# Patient Record
Sex: Male | Born: 1970 | Race: Black or African American | Hispanic: No | Smoking: Former smoker
Health system: Southern US, Community
[De-identification: ages and names within clinical notes are randomized; demographics above are authoritative.]

## PROBLEM LIST (undated history)

## (undated) DIAGNOSIS — N183 Chronic kidney disease, stage 3 unspecified: Secondary | ICD-10-CM

## (undated) DIAGNOSIS — D649 Anemia, unspecified: Secondary | ICD-10-CM

## (undated) DIAGNOSIS — K219 Gastro-esophageal reflux disease without esophagitis: Secondary | ICD-10-CM

## (undated) DIAGNOSIS — J302 Other seasonal allergic rhinitis: Secondary | ICD-10-CM

## (undated) DIAGNOSIS — I83009 Varicose veins of unspecified lower extremity with ulcer of unspecified site: Secondary | ICD-10-CM

## (undated) DIAGNOSIS — G629 Polyneuropathy, unspecified: Secondary | ICD-10-CM

## (undated) DIAGNOSIS — A419 Sepsis, unspecified organism: Secondary | ICD-10-CM

## (undated) DIAGNOSIS — F329 Major depressive disorder, single episode, unspecified: Secondary | ICD-10-CM

## (undated) DIAGNOSIS — E118 Type 2 diabetes mellitus with unspecified complications: Secondary | ICD-10-CM

## (undated) DIAGNOSIS — L97909 Non-pressure chronic ulcer of unspecified part of unspecified lower leg with unspecified severity: Secondary | ICD-10-CM

## (undated) DIAGNOSIS — I10 Essential (primary) hypertension: Secondary | ICD-10-CM

## (undated) DIAGNOSIS — M109 Gout, unspecified: Secondary | ICD-10-CM

## (undated) DIAGNOSIS — H269 Unspecified cataract: Secondary | ICD-10-CM

## (undated) DIAGNOSIS — IMO0002 Reserved for concepts with insufficient information to code with codable children: Secondary | ICD-10-CM

## (undated) DIAGNOSIS — R0602 Shortness of breath: Secondary | ICD-10-CM

## (undated) DIAGNOSIS — F32A Depression, unspecified: Secondary | ICD-10-CM

## (undated) DIAGNOSIS — I82409 Acute embolism and thrombosis of unspecified deep veins of unspecified lower extremity: Secondary | ICD-10-CM

## (undated) DIAGNOSIS — I38 Endocarditis, valve unspecified: Secondary | ICD-10-CM

## (undated) HISTORY — DX: Acute embolism and thrombosis of unspecified deep veins of unspecified lower extremity: I82.409

## (undated) HISTORY — DX: Type 2 diabetes mellitus with unspecified complications: E11.8

## (undated) HISTORY — DX: Sepsis, unspecified organism: A41.9

## (undated) HISTORY — PX: OTHER SURGICAL HISTORY: SHX169

## (undated) HISTORY — PX: CYST EXCISION: SHX5701

## (undated) HISTORY — DX: Unspecified cataract: H26.9

## (undated) HISTORY — DX: Non-pressure chronic ulcer of unspecified part of unspecified lower leg with unspecified severity: L97.909

## (undated) HISTORY — DX: Chronic kidney disease, stage 3 unspecified: N18.30

## (undated) HISTORY — DX: Morbid (severe) obesity due to excess calories: E66.01

## (undated) HISTORY — DX: Major depressive disorder, single episode, unspecified: F32.9

## (undated) HISTORY — DX: Depression, unspecified: F32.A

## (undated) HISTORY — DX: Essential (primary) hypertension: I10

## (undated) HISTORY — DX: Reserved for concepts with insufficient information to code with codable children: IMO0002

## (undated) HISTORY — DX: Varicose veins of unspecified lower extremity with ulcer of unspecified site: I83.009

## (undated) HISTORY — DX: Polyneuropathy, unspecified: G62.9

## (undated) HISTORY — DX: Gout, unspecified: M10.9

## (undated) HISTORY — DX: Chronic kidney disease, stage 3 (moderate): N18.3

## (undated) HISTORY — DX: Other seasonal allergic rhinitis: J30.2

## (undated) HISTORY — PX: BELOW KNEE LEG AMPUTATION: SUR23

## (undated) HISTORY — DX: Gastro-esophageal reflux disease without esophagitis: K21.9

## (undated) HISTORY — DX: Anemia, unspecified: D64.9

---

## 2013-12-24 ENCOUNTER — Other Ambulatory Visit: Payer: Self-pay

## 2013-12-24 DIAGNOSIS — N186 End stage renal disease: Secondary | ICD-10-CM

## 2013-12-24 DIAGNOSIS — Z01810 Encounter for preprocedural cardiovascular examination: Secondary | ICD-10-CM

## 2014-01-08 ENCOUNTER — Encounter: Payer: Self-pay | Admitting: Surgery

## 2014-01-11 ENCOUNTER — Ambulatory Visit (INDEPENDENT_AMBULATORY_CARE_PROVIDER_SITE_OTHER)
Admission: RE | Admit: 2014-01-11 | Discharge: 2014-01-11 | Disposition: A | Discharge status: Home / Self Care | Payer: No Typology Code available for payment source | Source: Ambulatory Visit | Attending: Surgery | Admitting: Surgery

## 2014-01-11 ENCOUNTER — Encounter: Payer: Self-pay | Admitting: Surgery

## 2014-01-11 ENCOUNTER — Ambulatory Visit (HOSPITAL_COMMUNITY)
Admission: RE | Admit: 2014-01-11 | Discharge: 2014-01-11 | Disposition: A | Discharge status: Home / Self Care | Payer: No Typology Code available for payment source | Source: Ambulatory Visit | Attending: Surgery | Admitting: Surgery

## 2014-01-11 ENCOUNTER — Ambulatory Visit (INDEPENDENT_AMBULATORY_CARE_PROVIDER_SITE_OTHER): Payer: Medicare Other | Admitting: Surgery

## 2014-01-11 VITALS — BP 143/68 | HR 80 | Ht >= 80 in | Wt >= 6400 oz

## 2014-01-11 DIAGNOSIS — Z01810 Encounter for preprocedural cardiovascular examination: Secondary | ICD-10-CM

## 2014-01-11 DIAGNOSIS — N186 End stage renal disease: Secondary | ICD-10-CM

## 2014-01-11 DIAGNOSIS — Z01818 Encounter for other preprocedural examination: Secondary | ICD-10-CM | POA: Insufficient documentation

## 2014-01-11 NOTE — Progress Notes (Signed)
Patient name: Angel Mays MRN: [id number] DOB: [birth year] Sex: male   Referred by: Dr. Detterding  Reason for referral:  Chief Complaint  Patient presents with  . New Evaluation    eval for access placement     HISTORY OF PRESENT ILLNESS: The patient comes in today for evaluation of permanent dialysis access.  He is right-handed.  He is not yet on dialysis.  His renal failure is secondary to diabetes.  His diabetes is relatively well controlled.  He does suffer from morbid obesity.  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  He is bedridden secondary to his obesity.  The patient is on Xaralto for history of blood clots.  He is in a rehabilitation center currently as he was recently suffering from pneumonia.  He has not yet recovered from his bout of pneumonia.  Past Medical History  Diagnosis Date  . Anemia   . Diabetes mellitus without complication   . GERD (gastroesophageal reflux disease)   . Hypertension   . Venous stasis ulcers   . Morbid obesity     Past Surgical History  Procedure Laterality Date  . Below knee leg amputation Left     History   Social History  . Marital Status: Single    Spouse Name: N/A    Number of Children: N/A  . Years of Education: N/A   Occupational History  . Not on file.   Social History Main Topics  . Smoking status: Never Smoker   . Smokeless tobacco: Never Used  . Alcohol Use: No  . Drug Use: No  . Sexual Activity: Not on file   Other Topics Concern  . Not on file   Social History Narrative  . No narrative on file    Family History  Problem Relation Age of Onset  . Diabetes Mother   . Hyperlipidemia Mother   . Hypertension Mother     Allergies as of 01/11/2014 - Review Complete 01/11/2014  Allergen Reaction Noted  . Omnipaque [iohexol]  01/11/2014    No current outpatient prescriptions on file prior to visit.   No current facility-administered medications on file  prior to visit.     REVIEW OF SYSTEMS: Cardiovascular: Shortness of breath with exertion, bilateral leg swelling Pulmonary: No productive cough, asthma or wheezing. Neurologic: No weakness, paresthesias, aphasia, or amaurosis. No dizziness. Hematologic: No bleeding problems or clotting disorders. Musculoskeletal: Leg weakness Gastrointestinal: No blood in stool or hematemesis Genitourinary: No dysuria or hematuria. Psychiatric:: No history of major depression. Integumentary: Lower extremity ulcers. Constitutional: No fever or chills.  PHYSICAL EXAMINATION: General: The patient appears their stated age.  Vital signs are BP 143/68  Pulse 80  Ht 6\' 8"  (2.032 m)  Wt 490 lb (222.263 kg)  BMI 53.83 kg/m2  SpO2 100% HEENT:  No gross abnormalities Pulmonary: Respirations are non-labored Abdomen: Soft and non-tender  Musculoskeletal: Left below knee amputation   Neurologic: No focal weakness or paresthesias are detected, Skin: There are no ulcer or rashes noted.  Venous stasis changes to the right leg Psychiatric: The patient has normal affect. Cardiovascular: There is a regular rate and rhythm without significant murmur appreciated.  Palpable left radial and brachial pulse  Diagnostic Studies: Vein mapping was ordered and reviewed.  He has a marginal vein on both the left and the right.    Assessment:  Chronic renal insufficiency Plan: After reviewing the patient's vein mapping, I have elected to proceed with a  left brachiocephalic fistula.  His vein somewhat marginal in the upper arm, however I feel this is reasonable to start to see if we can get this to dilate.  I discussed the risks and benefits of the operation with the patient which include but are not limited to the risk of steal, the risk of non-maturity, and the need for future interventions.  Potentially, the patient could have a basilic vein transposition.  I will initially attempt a brachiocephalic fistula.  The patient is  on Xaralto.  This will need to be discontinued prior to his operation.  I will his medical doctors make a decision regarding the need for Lovenox bridge.  The patient wants to continue to get his strength back from his recent pneumonia.  He would like to delay the operation for approximately one month.  He will contact me when he feels he is a strong enough to undergo an operation     V. Charlena Cross, M.D. [facility] Office: [phone] Pager:  [phone]

## 2014-01-19 ENCOUNTER — Other Ambulatory Visit: Payer: Self-pay | Admitting: *Deleted

## 2014-01-19 ENCOUNTER — Encounter: Payer: Self-pay | Admitting: *Deleted

## 2014-01-21 ENCOUNTER — Inpatient Hospital Stay (HOSPITAL_COMMUNITY)
Admission: EM | Admit: 2014-01-21 | Discharge: 2014-01-28 | DRG: 682 | Payer: Medicare Other | Attending: Internal Medicine | Admitting: Internal Medicine

## 2014-01-21 ENCOUNTER — Encounter (HOSPITAL_COMMUNITY): Payer: Self-pay | Admitting: Emergency Medicine

## 2014-01-21 ENCOUNTER — Emergency Department (HOSPITAL_COMMUNITY): Payer: Medicare Other

## 2014-01-21 DIAGNOSIS — K219 Gastro-esophageal reflux disease without esophagitis: Secondary | ICD-10-CM | POA: Diagnosis present

## 2014-01-21 DIAGNOSIS — L97909 Non-pressure chronic ulcer of unspecified part of unspecified lower leg with unspecified severity: Secondary | ICD-10-CM | POA: Diagnosis present

## 2014-01-21 DIAGNOSIS — L97809 Non-pressure chronic ulcer of other part of unspecified lower leg with unspecified severity: Secondary | ICD-10-CM | POA: Diagnosis present

## 2014-01-21 DIAGNOSIS — I872 Venous insufficiency (chronic) (peripheral): Secondary | ICD-10-CM | POA: Diagnosis present

## 2014-01-21 DIAGNOSIS — R5381 Other malaise: Secondary | ICD-10-CM | POA: Diagnosis present

## 2014-01-21 DIAGNOSIS — G9340 Encephalopathy, unspecified: Secondary | ICD-10-CM | POA: Diagnosis present

## 2014-01-21 DIAGNOSIS — Z7401 Bed confinement status: Secondary | ICD-10-CM

## 2014-01-21 DIAGNOSIS — E1169 Type 2 diabetes mellitus with other specified complication: Secondary | ICD-10-CM | POA: Diagnosis present

## 2014-01-21 DIAGNOSIS — N186 End stage renal disease: Secondary | ICD-10-CM | POA: Diagnosis present

## 2014-01-21 DIAGNOSIS — M869 Osteomyelitis, unspecified: Secondary | ICD-10-CM

## 2014-01-21 DIAGNOSIS — E872 Acidosis, unspecified: Secondary | ICD-10-CM | POA: Diagnosis present

## 2014-01-21 DIAGNOSIS — G92 Toxic encephalopathy: Secondary | ICD-10-CM | POA: Diagnosis present

## 2014-01-21 DIAGNOSIS — Z7901 Long term (current) use of anticoagulants: Secondary | ICD-10-CM

## 2014-01-21 DIAGNOSIS — M899 Disorder of bone, unspecified: Secondary | ICD-10-CM | POA: Diagnosis present

## 2014-01-21 DIAGNOSIS — M109 Gout, unspecified: Secondary | ICD-10-CM | POA: Diagnosis present

## 2014-01-21 DIAGNOSIS — I890 Lymphedema, not elsewhere classified: Secondary | ICD-10-CM | POA: Diagnosis present

## 2014-01-21 DIAGNOSIS — N189 Chronic kidney disease, unspecified: Secondary | ICD-10-CM | POA: Diagnosis present

## 2014-01-21 DIAGNOSIS — E860 Dehydration: Secondary | ICD-10-CM | POA: Diagnosis present

## 2014-01-21 DIAGNOSIS — I878 Other specified disorders of veins: Secondary | ICD-10-CM | POA: Diagnosis present

## 2014-01-21 DIAGNOSIS — G929 Unspecified toxic encephalopathy: Secondary | ICD-10-CM | POA: Diagnosis present

## 2014-01-21 DIAGNOSIS — M79609 Pain in unspecified limb: Secondary | ICD-10-CM | POA: Diagnosis present

## 2014-01-21 DIAGNOSIS — L089 Local infection of the skin and subcutaneous tissue, unspecified: Secondary | ICD-10-CM

## 2014-01-21 DIAGNOSIS — Z794 Long term (current) use of insulin: Secondary | ICD-10-CM

## 2014-01-21 DIAGNOSIS — S88119A Complete traumatic amputation at level between knee and ankle, unspecified lower leg, initial encounter: Secondary | ICD-10-CM

## 2014-01-21 DIAGNOSIS — T148XXA Other injury of unspecified body region, initial encounter: Secondary | ICD-10-CM

## 2014-01-21 DIAGNOSIS — E119 Type 2 diabetes mellitus without complications: Secondary | ICD-10-CM | POA: Diagnosis present

## 2014-01-21 DIAGNOSIS — E876 Hypokalemia: Secondary | ICD-10-CM | POA: Diagnosis not present

## 2014-01-21 DIAGNOSIS — D649 Anemia, unspecified: Secondary | ICD-10-CM | POA: Diagnosis present

## 2014-01-21 DIAGNOSIS — Z833 Family history of diabetes mellitus: Secondary | ICD-10-CM

## 2014-01-21 DIAGNOSIS — Z86718 Personal history of other venous thrombosis and embolism: Secondary | ICD-10-CM

## 2014-01-21 DIAGNOSIS — I10 Essential (primary) hypertension: Secondary | ICD-10-CM | POA: Diagnosis present

## 2014-01-21 DIAGNOSIS — Z6841 Body Mass Index (BMI) 40.0 and over, adult: Secondary | ICD-10-CM

## 2014-01-21 DIAGNOSIS — I120 Hypertensive chronic kidney disease with stage 5 chronic kidney disease or end stage renal disease: Principal | ICD-10-CM | POA: Diagnosis present

## 2014-01-21 DIAGNOSIS — M949 Disorder of cartilage, unspecified: Secondary | ICD-10-CM

## 2014-01-21 DIAGNOSIS — L8990 Pressure ulcer of unspecified site, unspecified stage: Secondary | ICD-10-CM | POA: Diagnosis present

## 2014-01-21 LAB — CBG MONITORING, ED: Glucose-Capillary: 134 mg/dL — ABNORMAL HIGH (ref 70–99)

## 2014-01-21 NOTE — ED Notes (Signed)
[facility] c/o LOC x1 week. Per EMS, CBG-111, BP-130/62, HR-100, O2-95%, temp at facility 103F. Dr. Yetta FlockHodges at the facility recommended MRI.

## 2014-01-21 NOTE — ED Notes (Signed)
BS; EKG; Vitals all done and pt placed in gown.

## 2014-01-21 NOTE — ED Provider Notes (Signed)
CSN: [id number]     Arrival date & time 01/21/14  2311 History   First MD Initiated Contact with Patient 01/21/14 2314     Chief Complaint  Patient presents with  . Altered Mental Status     (Consider location/radiation/quality/duration/timing/severity/associated sxs/prior Treatment) HPI Patient in rehabilitation center in [facility] after recent admission to [facility] for pneumonia presents for roughly one week of altered mental status per facility. According to the notes the patient has been less responsive. Patient states he has not felt like eating or moving and sits in bed "like a blob". He has left BKA and is being treated for right lower extremity ulcerations. He states the pain at this site is 10/10 but this is unchanged. He has no new redness or swelling at the site. Patient was noted to have a temperature of 103 at facility. He denies any chills, URI symptoms, neck pain or stiffness, shortness of breath, cough, abdominal pain, nausea, vomiting or diarrhea. Patient also denies any urinary symptoms. Past Medical History  Diagnosis Date  . Anemia   . Diabetes mellitus without complication   . GERD (gastroesophageal reflux disease)   . Hypertension   . Venous stasis ulcers   . Morbid obesity   . Renal disorder    Past Surgical History  Procedure Laterality Date  . Below knee leg amputation Left    Family History  Problem Relation Age of Onset  . Diabetes Mother   . Hyperlipidemia Mother   . Hypertension Mother    History  Substance Use Topics  . Smoking status: Never Smoker   . Smokeless tobacco: Never Used  . Alcohol Use: No    Review of Systems  Constitutional: Positive for fever and fatigue. Negative for chills.  HENT: Negative for congestion, rhinorrhea and sore throat.   Respiratory: Negative for cough, shortness of breath and wheezing.   Cardiovascular: Negative for chest pain, palpitations and leg swelling.  Gastrointestinal: Negative for  nausea, vomiting, abdominal pain and diarrhea.  Genitourinary: Negative for dysuria and frequency.  Musculoskeletal: Negative for back pain, myalgias, neck pain and neck stiffness.  Skin: Positive for wound.  Neurological: Positive for weakness (generalized). Negative for dizziness, syncope, light-headedness, numbness and headaches.  All other systems reviewed and are negative.      Allergies  Omnipaque  Home Medications   Current Outpatient Rx  Name  Route  Sig  Dispense  Refill  . acetaZOLAMIDE (DIAMOX) 500 MG capsule   Oral   Take 500 mg by mouth 3 (three) times daily.         Marland Kitchen albuterol (ACCUNEB) 0.63 MG/3ML nebulizer solution   Nebulization   Take 1 ampule by nebulization every 6 (six) hours as needed for wheezing.         Marland Kitchen allopurinol (ZYLOPRIM) 100 MG tablet   Oral   Take 100 mg by mouth daily.         Marland Kitchen amLODipine (NORVASC) 10 MG tablet   Oral   Take 10 mg by mouth daily.         . B Complex-C-Folic Acid (NEPHRO-VITE PO)   Oral   Take by mouth daily.         . beta carotene ([employer]S BETA CAROTENE) 40981 UNIT capsule   Oral   Take 25,000 Units by mouth daily.         . carvedilol (COREG) 25 MG tablet   Oral   Take 25 mg by mouth 2 (two) times daily with  a meal.         . Cholecalciferol (VITAMIN D-3) 5000 UNITS TABS   Oral   Take 1 tablet by mouth. Monday-Friday         . cloNIDine (CATAPRES) 0.2 MG tablet   Oral   Take 0.2 mg by mouth 2 (two) times daily.         . diphenhydrAMINE (SOMINEX) 25 MG tablet   Oral   Take 25 mg by mouth at bedtime as needed for sleep.         . ferrous gluconate (FERGON) 324 MG tablet   Oral   Take 324 mg by mouth 2 (two) times daily with a meal.         . fexofenadine (ALLEGRA) 180 MG tablet   Oral   Take 180 mg by mouth daily.         . fluticasone (FLONASE) 50 MCG/ACT nasal spray   Each Nare   Place 2 sprays into both nostrils daily.         Marland Kitchen. gabapentin (NEURONTIN) 300 MG capsule    Oral   Take 300 mg by mouth 3 (three) times daily.         . hydrALAZINE (APRESOLINE) 100 MG tablet   Oral   Take 100 mg by mouth 3 (three) times daily.         . insulin glargine (LANTUS) 100 UNIT/ML injection   Subcutaneous   Inject 30 Units into the skin at bedtime. As directed         . LORazepam (ATIVAN) 0.5 MG tablet   Oral   Take 0.5 mg by mouth every 8 (eight) hours as needed for anxiety.         Marland Kitchen. omeprazole (PRILOSEC) 20 MG capsule   Oral   Take 20 mg by mouth daily.         . ondansetron (ZOFRAN) 4 MG tablet   Oral   Take 4 mg by mouth every 8 (eight) hours as needed for nausea or vomiting.         . OxyCODONE (OXYCONTIN) 10 mg T12A 12 hr tablet   Oral   Take 10 mg by mouth every 12 (twelve) hours.         . pentoxifylline (TRENTAL) 400 MG CR tablet   Oral   Take 400 mg by mouth 3 (three) times daily with meals.         . polyethylene glycol (MIRALAX / GLYCOLAX) packet   Oral   Take 17 g by mouth daily.         . promethazine (PHENERGAN) 25 MG suppository   Rectal   Place 25 mg rectally every 8 (eight) hours as needed for nausea or vomiting.         . Pseudoephedrine-Guaifenesin (GUAIFENESIN 600/PSE 120 PO)   Oral   Take 2 capsules by mouth 2 (two) times daily.         . Rivaroxaban (XARELTO) 20 MG TABS tablet   Oral   Take 20 mg by mouth daily with supper.         . senna (SENOKOT) 8.6 MG tablet   Oral   Take 2 tablets by mouth daily.         . sertraline (ZOLOFT) 25 MG tablet   Oral   Take 75 mg by mouth daily.         . simethicone (MYLICON) 80 MG chewable tablet   Oral   Chew 80 mg by  mouth every 6 (six) hours as needed for flatulence.         . traZODone (DESYREL) 100 MG tablet   Oral   Take 100 mg by mouth at bedtime.          BP 120/46  Pulse 91  Temp(Src) 97.7 F (36.5 C) (Oral)  Resp 14  SpO2 98% Physical Exam  Nursing note and vitals reviewed. Constitutional: He is oriented to person, place,  and time. He appears well-developed and well-nourished. No distress.  Morbidly obese  HENT:  Head: Normocephalic and atraumatic.  Dry mucous membranes  Eyes: EOM are normal. Pupils are equal, round, and reactive to light.  Neck: Normal range of motion. Neck supple.  No meningismus  Cardiovascular: Normal rate and regular rhythm.  Exam reveals no gallop and no friction rub.   No murmur heard. Pulmonary/Chest: Effort normal and breath sounds normal. No respiratory distress. He has no wheezes. He has no rales. He exhibits no tenderness.  Abdominal: Soft. Bowel sounds are normal. He exhibits no distension and no mass. There is no tenderness. There is no rebound and no guarding.  Musculoskeletal: Normal range of motion. He exhibits no edema and no tenderness.  Left BKA. Chronic venous stasis changes to right lower extremity. Patient has necrotic ulceration to the lateral surface of the right lower leg and distal portion of the right lateral foot. Foul-smelling no obvious purulent discharge and no obvious cellulitis. Mild surrounding warmth to the area. No tracking.  Neurological: He is alert and oriented to person, place, and time.  Patient moves all extremities for any focal deficit. Sensation is grossly intact. Patient is alert and oriented x3.  Skin: Skin is warm and dry. No rash noted. No erythema.  Psychiatric:  Dysphoric mood.    ED Course  Procedures (including critical care time) Labs Review Labs Reviewed  CBG MONITORING, ED - Abnormal; Notable for the following:    Glucose-Capillary 134 (*)    All other components within normal limits  CULTURE, BLOOD (ROUTINE X 2)  CULTURE, BLOOD (ROUTINE X 2)  CBC WITH DIFFERENTIAL  COMPREHENSIVE METABOLIC PANEL  TROPONIN I  URINALYSIS, ROUTINE W REFLEX MICROSCOPIC   Imaging Review No results found.   EKG Interpretation None      Date: 01/22/2014  Rate:89  Rhythm: normal sinus rhythm  QRS Axis: normal  Intervals: normal  ST/T Wave  abnormalities: normal  Conduction Disutrbances:none  Narrative Interpretation:   Old EKG Reviewed: none available   MDM   Final diagnoses:  None   discuss with hospitalist. We'll see patient in emergency apartment and admit. Bowel sounds remained stable. I treated with vancomycin for suspected wound infection.      Loren Racer, MD 01/22/14 [id number]

## 2014-01-22 ENCOUNTER — Inpatient Hospital Stay (HOSPITAL_COMMUNITY): Payer: Medicare Other

## 2014-01-22 ENCOUNTER — Encounter (HOSPITAL_COMMUNITY): Payer: Self-pay | Admitting: Internal Medicine

## 2014-01-22 DIAGNOSIS — M109 Gout, unspecified: Secondary | ICD-10-CM | POA: Diagnosis present

## 2014-01-22 DIAGNOSIS — D649 Anemia, unspecified: Secondary | ICD-10-CM | POA: Diagnosis present

## 2014-01-22 DIAGNOSIS — Z86718 Personal history of other venous thrombosis and embolism: Secondary | ICD-10-CM

## 2014-01-22 DIAGNOSIS — G9340 Encephalopathy, unspecified: Secondary | ICD-10-CM | POA: Diagnosis present

## 2014-01-22 DIAGNOSIS — L98499 Non-pressure chronic ulcer of skin of other sites with unspecified severity: Secondary | ICD-10-CM

## 2014-01-22 DIAGNOSIS — I878 Other specified disorders of veins: Secondary | ICD-10-CM | POA: Diagnosis present

## 2014-01-22 DIAGNOSIS — I10 Essential (primary) hypertension: Secondary | ICD-10-CM | POA: Diagnosis present

## 2014-01-22 DIAGNOSIS — E872 Acidosis, unspecified: Secondary | ICD-10-CM | POA: Diagnosis present

## 2014-01-22 DIAGNOSIS — N189 Chronic kidney disease, unspecified: Secondary | ICD-10-CM | POA: Diagnosis present

## 2014-01-22 DIAGNOSIS — E119 Type 2 diabetes mellitus without complications: Secondary | ICD-10-CM | POA: Diagnosis present

## 2014-01-22 DIAGNOSIS — I739 Peripheral vascular disease, unspecified: Secondary | ICD-10-CM

## 2014-01-22 DIAGNOSIS — L97909 Non-pressure chronic ulcer of unspecified part of unspecified lower leg with unspecified severity: Secondary | ICD-10-CM | POA: Diagnosis present

## 2014-01-22 LAB — BASIC METABOLIC PANEL
BUN: 78 mg/dL — AB (ref 6–23)
CALCIUM: 8.4 mg/dL (ref 8.4–10.5)
CHLORIDE: 108 meq/L (ref 96–112)
CO2: 13 meq/L — AB (ref 19–32)
CREATININE: 4.19 mg/dL — AB (ref 0.50–1.35)
GFR calc Af Amer: 18 mL/min — ABNORMAL LOW (ref >90)
GFR calc non Af Amer: 16 mL/min — ABNORMAL LOW (ref >90)
Glucose, Bld: 115 mg/dL — ABNORMAL HIGH (ref 70–99)
Potassium: 4.1 mEq/L (ref 3.7–5.3)
Sodium: 140 mEq/L (ref 137–147)

## 2014-01-22 LAB — CBC WITH DIFFERENTIAL/PLATELET
BASOS ABS: 0.1 10*3/uL (ref 0.0–0.1)
BASOS PCT: 1 % (ref 0–1)
Basophils Absolute: 0.1 10*3/uL (ref 0.0–0.1)
Basophils Relative: 1 % (ref 0–1)
EOS PCT: 1 % (ref 0–5)
Eosinophils Absolute: 0.1 10*3/uL (ref 0.0–0.7)
Eosinophils Absolute: 0.1 10*3/uL (ref 0.0–0.7)
Eosinophils Relative: 1 % (ref 0–5)
HEMATOCRIT: 26.4 % — AB (ref 39.0–52.0)
HEMATOCRIT: 27.4 % — AB (ref 39.0–52.0)
HEMOGLOBIN: 8.5 g/dL — AB (ref 13.0–17.0)
Hemoglobin: 8.3 g/dL — ABNORMAL LOW (ref 13.0–17.0)
LYMPHS ABS: 0.9 10*3/uL (ref 0.7–4.0)
LYMPHS PCT: 10 % — AB (ref 12–46)
Lymphocytes Relative: 8 % — ABNORMAL LOW (ref 12–46)
Lymphs Abs: 1 10*3/uL (ref 0.7–4.0)
MCH: 28.4 pg (ref 26.0–34.0)
MCH: 28.4 pg (ref 26.0–34.0)
MCHC: 31 g/dL (ref 30.0–36.0)
MCHC: 31.4 g/dL (ref 30.0–36.0)
MCV: 91.6 fL (ref 78.0–100.0)
MCV: 90.4 fL (ref 78.0–100.0)
MONO ABS: 0.8 10*3/uL (ref 0.1–1.0)
MONOS PCT: 8 % (ref 3–12)
Monocytes Absolute: 0.8 10*3/uL (ref 0.1–1.0)
Monocytes Relative: 7 % (ref 3–12)
NEUTROS ABS: 8.1 10*3/uL — AB (ref 1.7–7.7)
NEUTROS ABS: 8.8 10*3/uL — AB (ref 1.7–7.7)
Neutrophils Relative %: 83 % — ABNORMAL HIGH (ref 43–77)
Neutrophils Relative %: 80 % — ABNORMAL HIGH (ref 43–77)
Platelets: 203 10*3/uL (ref 150–400)
Platelets: 200 10*3/uL (ref 150–400)
RBC: 2.99 MIL/uL — ABNORMAL LOW (ref 4.22–5.81)
RBC: 2.92 MIL/uL — ABNORMAL LOW (ref 4.22–5.81)
RDW: 17.5 % — ABNORMAL HIGH (ref 11.5–15.5)
RDW: 17.8 % — ABNORMAL HIGH (ref 11.5–15.5)
WBC: 10.7 10*3/uL — ABNORMAL HIGH (ref 4.0–10.5)
WBC: 10.1 10*3/uL (ref 4.0–10.5)

## 2014-01-22 LAB — TSH: TSH: 0.182 u[IU]/mL — ABNORMAL LOW (ref 0.350–4.500)

## 2014-01-22 LAB — COMPREHENSIVE METABOLIC PANEL
ALK PHOS: 105 U/L (ref 39–117)
ALT: <5 U/L (ref 0–53)
ALT: <5 U/L (ref 0–53)
AST: 16 U/L (ref 0–37)
AST: 14 U/L (ref 0–37)
Albumin: 3.2 g/dL — ABNORMAL LOW (ref 3.5–5.2)
Albumin: 3.1 g/dL — ABNORMAL LOW (ref 3.5–5.2)
Alkaline Phosphatase: 105 U/L (ref 39–117)
BILIRUBIN TOTAL: 0.3 mg/dL (ref 0.3–1.2)
BUN: 81 mg/dL — AB (ref 6–23)
BUN: 83 mg/dL — AB (ref 6–23)
CALCIUM: 8.5 mg/dL (ref 8.4–10.5)
CHLORIDE: 105 meq/L (ref 96–112)
CO2: 11 meq/L — AB (ref 19–32)
CO2: 12 meq/L — AB (ref 19–32)
Calcium: 8.7 mg/dL (ref 8.4–10.5)
Chloride: 109 mEq/L (ref 96–112)
Creatinine, Ser: 4.71 mg/dL — ABNORMAL HIGH (ref 0.50–1.35)
Creatinine, Ser: 4.56 mg/dL — ABNORMAL HIGH (ref 0.50–1.35)
GFR calc Af Amer: 16 mL/min — ABNORMAL LOW (ref >90)
GFR calc Af Amer: 17 mL/min — ABNORMAL LOW (ref >90)
GFR, EST NON AFRICAN AMERICAN: 14 mL/min — AB (ref >90)
GFR, EST NON AFRICAN AMERICAN: 14 mL/min — AB (ref >90)
GLUCOSE: 116 mg/dL — AB (ref 70–99)
Glucose, Bld: 119 mg/dL — ABNORMAL HIGH (ref 70–99)
Potassium: 4.4 mEq/L (ref 3.7–5.3)
Potassium: 4.2 mEq/L (ref 3.7–5.3)
Sodium: 136 mEq/L — ABNORMAL LOW (ref 137–147)
Sodium: 141 mEq/L (ref 137–147)
TOTAL PROTEIN: 8 g/dL (ref 6.0–8.3)
Total Bilirubin: 0.3 mg/dL (ref 0.3–1.2)
Total Protein: 7.9 g/dL (ref 6.0–8.3)

## 2014-01-22 LAB — PROTIME-INR
INR: 2.24 — ABNORMAL HIGH (ref 0.00–1.49)
INR: 2 — ABNORMAL HIGH (ref 0.00–1.49)
Prothrombin Time: 24.1 seconds — ABNORMAL HIGH (ref 11.6–15.2)
Prothrombin Time: 22.1 seconds — ABNORMAL HIGH (ref 11.6–15.2)

## 2014-01-22 LAB — GLUCOSE, CAPILLARY
GLUCOSE-CAPILLARY: 119 mg/dL — AB (ref 70–99)
GLUCOSE-CAPILLARY: 115 mg/dL — AB (ref 70–99)
Glucose-Capillary: 113 mg/dL — ABNORMAL HIGH (ref 70–99)
Glucose-Capillary: 111 mg/dL — ABNORMAL HIGH (ref 70–99)

## 2014-01-22 LAB — I-STAT CG4 LACTIC ACID, ED

## 2014-01-22 LAB — ABO/RH: ABO/RH(D): AB POS

## 2014-01-22 LAB — URINALYSIS, ROUTINE W REFLEX MICROSCOPIC
Glucose, UA: NEGATIVE mg/dL
Hgb urine dipstick: NEGATIVE
KETONES UR: 15 mg/dL — AB
Leukocytes, UA: NEGATIVE
Nitrite: NEGATIVE
Protein, ur: 100 mg/dL — AB
Specific Gravity, Urine: 1.023 (ref 1.005–1.030)
UROBILINOGEN UA: 0.2 mg/dL (ref 0.0–1.0)
pH: 5 (ref 5.0–8.0)

## 2014-01-22 LAB — RETICULOCYTES
RBC.: 2.7 MIL/uL — ABNORMAL LOW (ref 4.22–5.81)
Retic Count, Absolute: 86.4 10*3/uL (ref 19.0–186.0)
Retic Ct Pct: 3.2 % — ABNORMAL HIGH (ref 0.4–3.1)

## 2014-01-22 LAB — I-STAT ARTERIAL BLOOD GAS, ED
ACID-BASE DEFICIT: 18 mmol/L — AB (ref 0.0–2.0)
Bicarbonate: 10.5 mEq/L — ABNORMAL LOW (ref 20.0–24.0)
O2 SAT: 95 %
TCO2: 11 mmol/L (ref 0–100)
pCO2 arterial: 32.5 mmHg — ABNORMAL LOW (ref 35.0–45.0)
pH, Arterial: 7.116 — CL (ref 7.350–7.450)
pO2, Arterial: 103 mmHg — ABNORMAL HIGH (ref 80.0–100.0)

## 2014-01-22 LAB — SALICYLATE LEVEL: Salicylate Lvl: <2 mg/dL — ABNORMAL LOW (ref 2.8–20.0)

## 2014-01-22 LAB — LACTIC ACID, PLASMA: Lactic Acid, Venous: 0.4 mmol/L — ABNORMAL LOW (ref 0.5–2.2)

## 2014-01-22 LAB — HEMOGLOBIN A1C
Hgb A1c MFr Bld: 5.8 % — ABNORMAL HIGH (ref <5.7)
MEAN PLASMA GLUCOSE: 120 mg/dL — AB (ref <117)

## 2014-01-22 LAB — TROPONIN I

## 2014-01-22 LAB — KETONES, QUALITATIVE

## 2014-01-22 LAB — MRSA PCR SCREENING: MRSA BY PCR: NEGATIVE

## 2014-01-22 LAB — PHOSPHORUS: PHOSPHORUS: 6.4 mg/dL — AB (ref 2.3–4.6)

## 2014-01-22 LAB — URINE MICROSCOPIC-ADD ON

## 2014-01-22 LAB — APTT: APTT: 54 s — AB (ref 24–37)

## 2014-01-22 LAB — POC OCCULT BLOOD, ED: Fecal Occult Bld: NEGATIVE

## 2014-01-22 MED ORDER — HEPARIN (PORCINE) IN NACL 100-0.45 UNIT/ML-% IJ SOLN
1750.0000 [IU]/h | INTRAMUSCULAR | Status: DC
  Administered 2014-01-22 – 2014-01-23 (×2): 1500 [IU]/h via INTRAVENOUS
  Filled 2014-01-22 (×5): qty 250

## 2014-01-22 MED ORDER — NEPHRO-VITE 0.8 MG PO TABS
1.0000 | ORAL_TABLET | Freq: Every day | ORAL | Status: DC
  Administered 2014-01-23 – 2014-01-27 (×5): 1 via ORAL
  Filled 2014-01-22 (×7): qty 1

## 2014-01-22 MED ORDER — SODIUM CHLORIDE 0.9 % IJ SOLN
3.0000 mL | Freq: Two times a day (BID) | INTRAMUSCULAR | Status: DC
  Administered 2014-01-22 – 2014-01-27 (×7): 3 mL via INTRAVENOUS

## 2014-01-22 MED ORDER — ACETAMINOPHEN 650 MG RE SUPP: 650.0000 mg | Freq: Four times a day (QID) | RECTAL | Status: DC | PRN

## 2014-01-22 MED ORDER — LORATADINE 10 MG PO TABS
10.0000 mg | ORAL_TABLET | Freq: Every day | ORAL | Status: DC
  Administered 2014-01-22 – 2014-01-28 (×7): 10 mg via ORAL
  Filled 2014-01-22 (×7): qty 1

## 2014-01-22 MED ORDER — SODIUM BICARBONATE 8.4 % IV SOLN
INTRAVENOUS | Status: DC
  Administered 2014-01-22 – 2014-01-24 (×3): via INTRAVENOUS
  Filled 2014-01-22 (×8): qty 1000

## 2014-01-22 MED ORDER — BETA CAROTENE 25000 UNITS PO CAPS: 25000.0000 [IU] | ORAL_CAPSULE | Freq: Every day | ORAL | Status: DC

## 2014-01-22 MED ORDER — PENTAFLUOROPROP-TETRAFLUOROETH EX AERO: 1.0000 "application " | INHALATION_SPRAY | CUTANEOUS | Status: DC | PRN

## 2014-01-22 MED ORDER — AMLODIPINE BESYLATE 10 MG PO TABS
10.0000 mg | ORAL_TABLET | Freq: Every day | ORAL | Status: DC
  Administered 2014-01-22 – 2014-01-28 (×7): 10 mg via ORAL
  Filled 2014-01-22 (×7): qty 1

## 2014-01-22 MED ORDER — INSULIN ASPART 100 UNIT/ML ~~LOC~~ SOLN
0.0000 [IU] | Freq: Three times a day (TID) | SUBCUTANEOUS | Status: DC
  Administered 2014-01-24 (×2): 2 [IU] via SUBCUTANEOUS
  Administered 2014-01-25 (×3): 1 [IU] via SUBCUTANEOUS
  Administered 2014-01-26: 2 [IU] via SUBCUTANEOUS
  Administered 2014-01-26: 1 [IU] via SUBCUTANEOUS
  Administered 2014-01-27: 3 [IU] via SUBCUTANEOUS
  Administered 2014-01-27 – 2014-01-28 (×4): 2 [IU] via SUBCUTANEOUS

## 2014-01-22 MED ORDER — SODIUM CHLORIDE 0.9 % IV SOLN: INTRAVENOUS | Status: DC

## 2014-01-22 MED ORDER — FERROUS GLUCONATE 324 (38 FE) MG PO TABS
324.0000 mg | ORAL_TABLET | Freq: Two times a day (BID) | ORAL | Status: DC
  Administered 2014-01-22 – 2014-01-24 (×6): 324 mg via ORAL
  Filled 2014-01-22 (×9): qty 1

## 2014-01-22 MED ORDER — LIDOCAINE-PRILOCAINE 2.5-2.5 % EX CREA
1.0000 "application " | TOPICAL_CREAM | CUTANEOUS | Status: DC | PRN
  Filled 2014-01-22: qty 5

## 2014-01-22 MED ORDER — ONDANSETRON HCL 4 MG PO TABS: 4.0000 mg | ORAL_TABLET | Freq: Three times a day (TID) | ORAL | Status: DC | PRN

## 2014-01-22 MED ORDER — CLONIDINE HCL 0.2 MG PO TABS
0.2000 mg | ORAL_TABLET | Freq: Two times a day (BID) | ORAL | Status: DC
  Administered 2014-01-22 – 2014-01-28 (×12): 0.2 mg via ORAL
  Filled 2014-01-22 (×14): qty 1

## 2014-01-22 MED ORDER — HYDRALAZINE HCL 100 MG PO TABS
100.0000 mg | ORAL_TABLET | Freq: Three times a day (TID) | ORAL | Status: DC
  Administered 2014-01-22 – 2014-01-28 (×17): 100 mg via ORAL
  Filled 2014-01-22 (×21): qty 1

## 2014-01-22 MED ORDER — ONDANSETRON HCL 4 MG/2ML IJ SOLN
4.0000 mg | Freq: Four times a day (QID) | INTRAMUSCULAR | Status: DC | PRN
  Administered 2014-01-24 – 2014-01-28 (×11): 4 mg via INTRAVENOUS
  Filled 2014-01-22 (×11): qty 2

## 2014-01-22 MED ORDER — SEVELAMER CARBONATE 800 MG PO TABS
800.0000 mg | ORAL_TABLET | Freq: Three times a day (TID) | ORAL | Status: DC
  Administered 2014-01-23 – 2014-01-27 (×7): 800 mg via ORAL
  Filled 2014-01-22 (×16): qty 1

## 2014-01-22 MED ORDER — ALLOPURINOL 100 MG PO TABS
100.0000 mg | ORAL_TABLET | Freq: Every day | ORAL | Status: DC
  Administered 2014-01-22 – 2014-01-28 (×7): 100 mg via ORAL
  Filled 2014-01-22 (×7): qty 1

## 2014-01-22 MED ORDER — NEPRO/CARBSTEADY PO LIQD
237.0000 mL | ORAL | Status: DC | PRN
  Filled 2014-01-22: qty 237

## 2014-01-22 MED ORDER — SODIUM CHLORIDE 0.9 % IV SOLN: 100.0000 mL | INTRAVENOUS | Status: DC | PRN

## 2014-01-22 MED ORDER — ALBUTEROL SULFATE (2.5 MG/3ML) 0.083% IN NEBU
2.5000 mg | INHALATION_SOLUTION | Freq: Four times a day (QID) | RESPIRATORY_TRACT | Status: DC | PRN
Start: 2014-01-22 — End: 2014-01-28

## 2014-01-22 MED ORDER — SODIUM BICARBONATE 8.4 % IV SOLN
INTRAVENOUS | Status: AC
  Filled 2014-01-22: qty 50

## 2014-01-22 MED ORDER — PIPERACILLIN-TAZOBACTAM 3.375 G IVPB
3.3750 g | Freq: Three times a day (TID) | INTRAVENOUS | Status: DC
  Administered 2014-01-22 (×2): 3.375 g via INTRAVENOUS
  Filled 2014-01-22 (×4): qty 50

## 2014-01-22 MED ORDER — CARVEDILOL 25 MG PO TABS
25.0000 mg | ORAL_TABLET | Freq: Two times a day (BID) | ORAL | Status: DC
  Administered 2014-01-22 – 2014-01-28 (×12): 25 mg via ORAL
  Filled 2014-01-22 (×15): qty 1

## 2014-01-22 MED ORDER — SODIUM BICARBONATE 8.4 % IV SOLN
100.0000 meq | Freq: Once | INTRAVENOUS | Status: AC
  Administered 2014-01-22: 100 meq via INTRAVENOUS
  Filled 2014-01-22: qty 100

## 2014-01-22 MED ORDER — VANCOMYCIN HCL IN DEXTROSE 1-5 GM/200ML-% IV SOLN
1000.0000 mg | Freq: Once | INTRAVENOUS | Status: AC
  Administered 2014-01-22: 1000 mg via INTRAVENOUS
  Filled 2014-01-22: qty 200

## 2014-01-22 MED ORDER — PIPERACILLIN-TAZOBACTAM 3.375 G IVPB
3.3750 g | Freq: Three times a day (TID) | INTRAVENOUS | Status: DC
  Administered 2014-01-23 – 2014-01-25 (×7): 3.375 g via INTRAVENOUS
  Filled 2014-01-22 (×9): qty 50

## 2014-01-22 MED ORDER — ACETAMINOPHEN 325 MG PO TABS: 650.0000 mg | ORAL_TABLET | Freq: Four times a day (QID) | ORAL | Status: DC | PRN

## 2014-01-22 MED ORDER — LIDOCAINE HCL (PF) 1 % IJ SOLN: 5.0000 mL | INTRAMUSCULAR | Status: DC | PRN

## 2014-01-22 MED ORDER — PANTOPRAZOLE SODIUM 40 MG PO TBEC
40.0000 mg | DELAYED_RELEASE_TABLET | Freq: Every day | ORAL | Status: DC
  Administered 2014-01-22 – 2014-01-28 (×7): 40 mg via ORAL
  Filled 2014-01-22 (×6): qty 1

## 2014-01-22 MED ORDER — ALBUTEROL SULFATE 0.63 MG/3ML IN NEBU: 1.0000 | INHALATION_SOLUTION | Freq: Four times a day (QID) | RESPIRATORY_TRACT | Status: DC | PRN

## 2014-01-22 MED ORDER — FLUTICASONE PROPIONATE 50 MCG/ACT NA SUSP
2.0000 | Freq: Every day | NASAL | Status: DC
  Administered 2014-01-22 – 2014-01-27 (×6): 2 via NASAL
  Filled 2014-01-22: qty 16

## 2014-01-22 MED ORDER — HEPARIN SODIUM (PORCINE) 1000 UNIT/ML DIALYSIS
20.0000 [IU]/kg | INTRAMUSCULAR | Status: DC | PRN
  Administered 2014-01-22: 4100 [IU] via INTRAVENOUS_CENTRAL
  Filled 2014-01-22: qty 5

## 2014-01-22 MED ORDER — PENTOXIFYLLINE ER 400 MG PO TBCR
400.0000 mg | EXTENDED_RELEASE_TABLET | Freq: Three times a day (TID) | ORAL | Status: DC
  Administered 2014-01-22 – 2014-01-28 (×16): 400 mg via ORAL
  Filled 2014-01-22 (×21): qty 1

## 2014-01-22 MED ORDER — VITAMIN D 1000 UNITS PO TABS
5000.0000 [IU] | ORAL_TABLET | ORAL | Status: DC
  Administered 2014-01-22 – 2014-01-28 (×5): 5000 [IU] via ORAL
  Filled 2014-01-22 (×5): qty 5

## 2014-01-22 MED ORDER — HEPARIN SODIUM (PORCINE) 1000 UNIT/ML DIALYSIS
1000.0000 [IU] | INTRAMUSCULAR | Status: DC | PRN
  Filled 2014-01-22: qty 1

## 2014-01-22 MED ORDER — HEPARIN (PORCINE) IN NACL 100-0.45 UNIT/ML-% IJ SOLN
2000.0000 [IU]/h | INTRAMUSCULAR | Status: DC
  Administered 2014-01-22: 2000 [IU]/h via INTRAVENOUS
  Filled 2014-01-22 (×2): qty 250

## 2014-01-22 MED ORDER — HEPARIN SODIUM (PORCINE) 1000 UNIT/ML IJ SOLN
3000.0000 [IU] | Freq: Once | INTRAMUSCULAR | Status: DC
  Filled 2014-01-22: qty 3

## 2014-01-22 MED ORDER — SODIUM CHLORIDE 0.9 % IV BOLUS (SEPSIS)
1000.0000 mL | Freq: Once | INTRAVENOUS | Status: AC
  Administered 2014-01-22: 1000 mL via INTRAVENOUS

## 2014-01-22 MED ORDER — PREDNISOLONE ACETATE 1 % OP SUSP
1.0000 [drp] | Freq: Four times a day (QID) | OPHTHALMIC | Status: DC
  Administered 2014-01-22 – 2014-01-28 (×22): 1 [drp] via OPHTHALMIC
  Filled 2014-01-22: qty 1

## 2014-01-22 MED ORDER — ALTEPLASE 2 MG IJ SOLR
2.0000 mg | Freq: Once | INTRAMUSCULAR | Status: AC | PRN
  Filled 2014-01-22: qty 2

## 2014-01-22 MED ORDER — ONDANSETRON HCL 4 MG PO TABS: 4.0000 mg | ORAL_TABLET | Freq: Four times a day (QID) | ORAL | Status: DC | PRN

## 2014-01-22 MED ORDER — VANCOMYCIN HCL 10 G IV SOLR
1500.0000 mg | Freq: Once | INTRAVENOUS | Status: AC
  Administered 2014-01-22: 1500 mg via INTRAVENOUS
  Filled 2014-01-22: qty 1500

## 2014-01-22 NOTE — H&P (Addendum)
[facility] History and Physical  [name] ZOX:[id number] DOB: August 22, 1971 DOA: 01/21/2014  Referring physician: ER physician. PCP: Abner Greenspan, MD   History obtained from patient's wife and ER physician.  Chief Complaint: Confusion and fever.  HPI: Angel Mays is a 43 y.o. male with history of chronic kidney disease, DVT on xarelto, diabetes mellitus, hypertension, gout was brought from the patient's rehabilitation because patient was found to be increasingly confused and febrile illness the patient wife patient has been getting increasingly confused over last few days with recurrent episodes of hypoglycemia. Patient was found to be febrile today in addition to his confusion and was referred to the ER. Patient has over the last 2-3 weeks developed a wound on his right lower extremity which had started having discharge with foul odor. Patient was admitted last month at [facility] after patient had cataract surgery and was found to be hypertensive and eventually was managed in ICU and was found to have influenza and pneumonia as per patient's wife. Patient is presently in rehabilitation. In the ER patient was found to be alert and awake and following commands and was afebrile. Patient is right leg wound looks necrotic and has mild discharge and is tender. Patient's labs reveal severe metabolic acidosis with both anion gap and non-anion gap. ABG shows pH of 7.1. Patient has been admitted for further workup. As per patient's wife patient did not have any nausea vomiting diarrhea chest pain shortness of breath. But has not been eating well over the last few days.   Review of Systems: As presented in the history of presenting illness, rest negative.  Past Medical History  Diagnosis Date  . Anemia   . Diabetes mellitus without complication   . GERD (gastroesophageal reflux disease)   . Hypertension   . Venous stasis ulcers   . Morbid obesity   . Renal disorder    Past Surgical  History  Procedure Laterality Date  . Below knee leg amputation Left    Social History:  reports that he has never smoked. He has never used smokeless tobacco. He reports that he does not drink alcohol or use illicit drugs. Where does patient live Rehab. Can patient participate in ADLs? Unsure.  Allergies  Allergen Reactions  . Omnipaque [Iohexol]     Family History:  Family History  Problem Relation Age of Onset  . Diabetes Mother   . Hyperlipidemia Mother   . Hypertension Mother       Prior to Admission medications   Medication Sig Start Date End Date Taking? Authorizing Provider  acetaZOLAMIDE (DIAMOX) 500 MG capsule Take 500 mg by mouth 3 (three) times daily.   Yes Historical Provider, MD  albuterol (ACCUNEB) 0.63 MG/3ML nebulizer solution Take 1 ampule by nebulization every 6 (six) hours as needed for wheezing.   Yes Historical Provider, MD  allopurinol (ZYLOPRIM) 100 MG tablet Take 100 mg by mouth daily.   Yes Historical Provider, MD  amLODipine (NORVASC) 10 MG tablet Take 10 mg by mouth daily.   Yes Historical Provider, MD  B Complex-C-Folic Acid (NEPHRO-VITE PO) Take by mouth daily.   Yes Historical Provider, MD  beta carotene (CVS BETA CAROTENE) 81191 UNIT capsule Take 25,000 Units by mouth daily.   Yes Historical Provider, MD  carvedilol (COREG) 25 MG tablet Take 25 mg by mouth 2 (two) times daily with a meal.   Yes Historical Provider, MD  Cholecalciferol (VITAMIN D-3) 5000 UNITS TABS Take 1 tablet by mouth. Monday-Friday   Yes Historical Provider,  MD  cloNIDine (CATAPRES) 0.2 MG tablet Take 0.2 mg by mouth 2 (two) times daily.   Yes Historical Provider, MD  diphenhydrAMINE (SOMINEX) 25 MG tablet Take 25 mg by mouth at bedtime as needed for sleep.   Yes Historical Provider, MD  ferrous gluconate (FERGON) 324 MG tablet Take 324 mg by mouth 2 (two) times daily with a meal.   Yes Historical Provider, MD  fexofenadine (ALLEGRA) 180 MG tablet Take 180 mg by mouth daily.   Yes  Historical Provider, MD  fluticasone (FLONASE) 50 MCG/ACT nasal spray Place 2 sprays into both nostrils daily.   Yes Historical Provider, MD  gabapentin (NEURONTIN) 300 MG capsule Take 300 mg by mouth 3 (three) times daily.   Yes Historical Provider, MD  hydrALAZINE (APRESOLINE) 100 MG tablet Take 100 mg by mouth 3 (three) times daily.   Yes Historical Provider, MD  insulin glargine (LANTUS) 100 UNIT/ML injection Inject 30 Units into the skin at bedtime. As directed   Yes Historical Provider, MD  LORazepam (ATIVAN) 0.5 MG tablet Take 0.5 mg by mouth every 8 (eight) hours as needed for anxiety.   Yes Historical Provider, MD  omeprazole (PRILOSEC) 20 MG capsule Take 20 mg by mouth daily.   Yes Historical Provider, MD  ondansetron (ZOFRAN) 4 MG tablet Take 4 mg by mouth every 8 (eight) hours as needed for nausea or vomiting.   Yes Historical Provider, MD  OxyCODONE (OXYCONTIN) 10 mg T12A 12 hr tablet Take 10 mg by mouth every 12 (twelve) hours.   Yes Historical Provider, MD  pentoxifylline (TRENTAL) 400 MG CR tablet Take 400 mg by mouth 3 (three) times daily with meals.   Yes Historical Provider, MD  polyethylene glycol (MIRALAX / GLYCOLAX) packet Take 17 g by mouth daily.   Yes Historical Provider, MD  prednisoLONE acetate (PRED FORTE) 1 % ophthalmic suspension Place 1 drop into the left eye 4 (four) times daily.   Yes Historical Provider, MD  promethazine (PHENERGAN) 25 MG suppository Place 25 mg rectally every 8 (eight) hours as needed for nausea or vomiting.   Yes Historical Provider, MD  Rivaroxaban (XARELTO) 20 MG TABS tablet Take 20 mg by mouth daily with supper.   Yes Historical Provider, MD  senna (SENOKOT) 8.6 MG tablet Take 2 tablets by mouth daily.   Yes Historical Provider, MD  sertraline (ZOLOFT) 25 MG tablet Take 75 mg by mouth daily.   Yes Historical Provider, MD  simethicone (MYLICON) 80 MG chewable tablet Chew 80 mg by mouth every 6 (six) hours as needed for flatulence.   Yes  Historical Provider, MD  traZODone (DESYREL) 100 MG tablet Take 100 mg by mouth at bedtime.   Yes Historical Provider, MD    Physical Exam: Filed Vitals:   01/22/14 0030 01/22/14 0130 01/22/14 0230 01/22/14 0300  BP: 111/51 109/90 114/50 115/40  Pulse: 90 91 90 92  Temp:      TempSrc:      Resp: 18 14 13 20   SpO2: 99% 98% 98% 100%     General:  Well-developed and morbidly obese.  Eyes: Anicteric no pallor.  ENT: No discharge from ears eyes nose mouth.  Neck: No mass felt.  Cardiovascular: S1-S2 heard.  Respiratory: No rhonchi or crepitations.  Abdomen: Soft nontender bowel sounds present. No guarding no rigidity.  Skin: Patient's right leg as a lateral aspect 2 wounds with mild necrosis and discharge.  Musculoskeletal: Left BKA. Right leg has wound.  Psychiatric: Patient presently is alert awake  and follows commands.  Neurologic: Moves all extremities.  Labs on Admission:  Basic Metabolic Panel:  Recent Labs Lab 01/22/14 0005  NA 136*  K 4.4  CL 105  CO2 11*  GLUCOSE 119*  BUN 83*  CREATININE 4.71*  CALCIUM 8.7   Liver Function Tests:  Recent Labs Lab 01/22/14 0005  AST 16  ALT <5  ALKPHOS 105  BILITOT 0.3  PROT 7.9  ALBUMIN 3.2*   No results found for this basename: LIPASE, AMYLASE,  in the last 168 hours No results found for this basename: AMMONIA,  in the last 168 hours CBC:  Recent Labs Lab 01/22/14 0005  WBC 10.7*  NEUTROABS 8.8*  HGB 8.5*  HCT 27.4*  MCV 91.6  PLT 203   Cardiac Enzymes:  Recent Labs Lab 01/22/14 0005  TROPONINI <0.30    BNP (last 3 results) No results found for this basename: PROBNP,  in the last 8760 hours CBG:  Recent Labs Lab 01/21/14 2316  GLUCAP 134*    Radiological Exams on Admission: Dg Chest Port 1 View  01/22/2014   CLINICAL DATA:  Altered mental status. Fever. Diabetic. High blood pressure  EXAM: PORTABLE CHEST - 1 VIEW  COMPARISON:  05/23/2013.  FINDINGS: Cardiomegaly.  Central  pulmonary vascular prominence.  No gross pneumothorax or segmental consolidation.  The patient would eventually benefit from two view chest.  IMPRESSION: Cardiomegaly.  Central pulmonary vascular prominence.   Electronically Signed   By: Bridgett Larsson M.D.   On: 01/22/2014 01:00     Assessment/Plan Principal Problem:   Acute encephalopathy Active Problems:   Metabolic acidosis   CKD (chronic kidney disease)   Leg ulcer   Diabetes mellitus   Anemia   Gout   HTN (hypertension)   History of DVT (deep vein thrombosis)   #1. Acute encephalopathy most likely secondary to uremia - at this time I have discussed with on-call nephrologist Dr. Lowell Guitar. Due to severe metabolic acidosis Dr. Lowell Guitar has advised to give 2 bicarbonate ampules. I have ordered renal sonogram and Foley catheter. Check UA. Closely follow intake output and metabolic panel.Gently hydrate for now as patient looks dehydrated. Further recommendations per nephrologist. CT head is pending. #2. Severe metabolic acidosis - probably secondary to renal failure. I have also discussed with critical care Dr. Tyson Alias. Dr. Tyson Alias feels there is also a non-anion gap component to the metabolic acidosis. Tyson Alias is advised to get a repeat ABG after bicarbonate bolus in one hour and if still acidotic to start bicarbonate infusion. Check acetone levels and urine ketones for possible DKA as contributing for acidosis. Check salicylate levels. #3. Right leg wound with infection - I have placed patient on vancomycin and Zosyn. CT leg to check for any possible bony involvement or abscess. Consult vascular surgeon. Follow blood cultures. #4. Diabetes mellitus type 2 - I have placed patient on sliding-scale coverage for now and holding Lantus due to hypoglycemic spells as per patient's wife and due to renal failure. Check acetone levels and urine ketones. Closely follow metabolic panel. #5. Hypertension - continue present medication. #6. Chronic anemia  probably from kidney disease - closely follow CBC. #7. History of DVT on xarelto - in anticipation of possible procedure like dialysis catheter placement I have held patient xarelto and place patient on heparin. #8. History of gout - continue present medication.  I have discussed with on-call nephrologist and pulmonary critical care.   Code Status: Full code.  Family Communication: Patient wife.  Disposition Plan: Admit to  inpatient.    Yasmine Kilbourne N. [facility] Pager [phone].  If 7PM-7AM, please contact night-coverage [email or web address] Password [facility] 01/22/2014, 4:26 AM

## 2014-01-22 NOTE — Procedures (Signed)
Central Venous Catheter Insertion Procedure Note Angel Mays [id number] 13-Jul-1971  Procedure: Insertion of Central Venous Catheter Indications: Assessment of intravascular volume, Drug and/or fluid administration and Frequent blood sampling  Procedure Details Consent: Risks of procedure as well as the alternatives and risks of each were explained to the (patient/caregiver).  Consent for procedure obtained. Time Out: Verified patient identification, verified procedure, site/side was marked, verified correct patient position, special equipment/implants available, medications/allergies/relevent history reviewed, required imaging and test results available.  Performed  Maximum sterile technique was used including antiseptics, cap, gloves, gown, hand hygiene, mask and sheet. Skin prep: Chlorhexidine; local anesthetic administered A antimicrobial bonded/coated triple lumen catheter was placed in the left internal jugular vein using the Seldinger technique.  Evaluation Blood flow good Complications: No apparent complications Patient did tolerate procedure well. Chest X-ray ordered to verify placement.  CXR: pending.  Procedure performed under US guidance, guidewire visualized in vessel.  Rutherford Guysahul Desai, PA - [facility] Pgr: [phone]  or [phone]   I was present for and supervised the entire procedure  Billy Fischeravid Simonds, MD ; [facility] Mobile [phone].  After 5:30 PM or weekends, call [phone]

## 2014-01-22 NOTE — Consult Note (Signed)
WOC wound consult note Reason for Consult: evaluation of multiple wounds.  Pt with RLE lateral leg wounds, history of lymphadema.  Wounds are mostly necrotic and patient has pending VVS consult, therefore I will not place any orders at this time for these wounds.   Wound type: sDTI (deep tissue injury) right posterior thigh, two sheer areas of the lower buttocks with some hyperkertosis of the skin.  Bilateral buttocks are reddened but appears to blanch.  Pressure Ulcer POA: Yes Measurement: Right posterior thigh: 3.5cm x 2.0cm x 0 Wound ZOX:WRUEbed:dark, maroon tissue, intact skin Drainage (amount, consistency, odor) none Periwound: intact Dressing procedure/placement/frequency:silicone foam changed every 3 days and PRN. Noted intertriginous skin damage under the pannus and candida overgrowth, will add Interdry Ag+  Sizewize bariatric bed ordered with air mattress.  VVS to consult on RLE.  Will follow up Monday on outcomes.     WOC will follow along Freedom Lopezperez Marlena Clipperustin RN, [place] [phone]

## 2014-01-22 NOTE — Consult Note (Signed)
[facility] [place] CONSULT [place]   MRN : [id number]  Reason for Consult: Right leg wound Referring Physician: Eduard ClosArshad N Kakrakandy, MD   History of Present Illness: 43 y/o male with chronic venous stasis.  He presents with right lateral leg ulcers and altered mental status.  The family states the ulcer was small and has progressed in the past 3 weeks.  His renal failure is secondary to diabetes. His diabetes is relatively well controlled. He does suffer from morbid obesity. 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 He is bedridden secondary to his obesity.  He was seen by Dr. Myra GianottiBrabham for Dialysis access consult last month.   F/U was set for 02/04/2014 for fistula creation.The patient is on Xaralto for history of blood clots. He is in a rehabilitation center currently.       Current Facility-Administered Medications  Medication Dose Route Frequency Provider Last Rate Last Dose  . acetaminophen (TYLENOL) tablet 650 mg  650 mg Oral Q6H PRN Eduard ClosArshad N Kakrakandy, MD       Or  . acetaminophen (TYLENOL) suppository 650 mg  650 mg Rectal Q6H PRN Eduard ClosArshad N Kakrakandy, MD      . albuterol (PROVENTIL) (2.5 MG/3ML) 0.083% nebulizer solution 2.5 mg  2.5 mg Nebulization Q6H PRN Eduard ClosArshad N Kakrakandy, MD      . allopurinol (ZYLOPRIM) tablet 100 mg  100 mg Oral Daily Eduard ClosArshad N Kakrakandy, MD   100 mg at 01/22/14 1008  . amLODipine (NORVASC) tablet 10 mg  10 mg Oral Daily Eduard ClosArshad N Kakrakandy, MD   10 mg at 01/22/14 1008  . b complex-vitamin c-folic acid (NEPHRO-VITE) tablet 1 tablet  1 tablet Oral QHS Eduard ClosArshad N Kakrakandy, MD      . carvedilol (COREG) tablet 25 mg  25 mg Oral BID WC Eduard ClosArshad N Kakrakandy, MD   25 mg at 01/22/14 1008  . cholecalciferol (VITAMIN D) tablet 5,000 Units  5,000 Units Oral Once per day on Mon Tue Wed Thu Fri Eduard ClosArshad N Kakrakandy, MD   5,000 Units at 01/22/14 1008  . cloNIDine (CATAPRES) tablet 0.2 mg  0.2 mg Oral BID Eduard ClosArshad N  Kakrakandy, MD   0.2 mg at 01/22/14 1040  . ferrous gluconate (FERGON) tablet 324 mg  324 mg Oral BID WC Eduard ClosArshad N Kakrakandy, MD   324 mg at 01/22/14 1008  . fluticasone (FLONASE) 50 MCG/ACT nasal spray 2 spray  2 spray Each Nare Daily Eduard ClosArshad N Kakrakandy, MD   2 spray at 01/22/14 1039  . heparin ADULT infusion 100 units/mL (25000 units/250 mL)  2,000 Units/hr Intravenous Continuous Eduard ClosArshad N Kakrakandy, MD 20 mL/hr at 01/22/14 1128 2,000 Units/hr at 01/22/14 1128  . hydrALAZINE (APRESOLINE) tablet 100 mg  100 mg Oral TID Eduard ClosArshad N Kakrakandy, MD      . insulin aspart (novoLOG) injection 0-9 Units  0-9 Units Subcutaneous TID WC Eduard ClosArshad N Kakrakandy, MD      . loratadine (CLARITIN) tablet 10 mg  10 mg Oral Daily Eduard ClosArshad N Kakrakandy, MD   10 mg at 01/22/14 1008  . ondansetron (ZOFRAN) tablet 4 mg  4 mg Oral Q6H PRN Eduard ClosArshad N Kakrakandy, MD       Or  . ondansetron [facility](ZOFRAN) injection 4 mg  4 mg Intravenous Q6H PRN Eduard ClosArshad N Kakrakandy, MD      . pantoprazole (PROTONIX) EC tablet 40 mg  40 mg Oral Daily Eduard ClosArshad N Kakrakandy, MD   40 mg at 01/22/14 1008  .  pentoxifylline (TRENTAL) CR tablet 400 mg  400 mg Oral TID WC Eduard Clos, MD   400 mg at 01/22/14 1129  . piperacillin-tazobactam (ZOSYN) IVPB 3.375 g  3.375 g Intravenous 3 times per day Eduard Clos, MD   3.375 g at 01/22/14 1128  . prednisoLONE acetate (PRED FORTE) 1 % ophthalmic suspension 1 drop  1 drop Left Eye QID Eduard Clos, MD   1 drop at 01/22/14 1041  . sodium bicarbonate 1 mEq/mL injection           . sodium chloride 0.45 % 1,000 mL with sodium bicarbonate 75 mEq infusion   Intravenous Continuous Eduard Clos, MD 75 mL/hr at 01/22/14 0950    . sodium chloride 0.9 % injection 3 mL  3 mL Intravenous Q12H Eduard Clos, MD   3 mL at 01/22/14 1041  . vancomycin (VANCOCIN) 1,500 mg in sodium chloride 0.9 % 500 mL IVPB  1,500 mg Intravenous Once Eduard Clos, MD   1,500 mg at 01/22/14 1129    Pt meds  include: Statin :No Betablocker: No ASA: No Other anticoagulants/antiplatelets: Xarelto  Past Medical History  Diagnosis Date  . Anemia   . Diabetes mellitus without complication   . GERD (gastroesophageal reflux disease)   . Hypertension   . Venous stasis ulcers   . Morbid obesity   . Renal disorder     Past Surgical History  Procedure Laterality Date  . Below knee leg amputation Left     Social History History  Substance Use Topics  . Smoking status: Never Smoker   . Smokeless tobacco: Never Used  . Alcohol Use: No    Family History Family History  Problem Relation Age of Onset  . Diabetes Mother   . Hyperlipidemia Mother   . Hypertension Mother     Allergies  Allergen Reactions  . Omnipaque [Iohexol]      REVIEW OF SYSTEMS  General: [ ]  Weight loss, [ ]  Fever, [ ]  chills Neurologic: [ ]  Dizziness, [ ]  Blackouts, [ ]  Seizure [ ]  Stroke, [ ]  "Mini stroke", [ ]  Slurred speech, [ ]  Temporary blindness; [ ]  weakness in arms or legs, [ ]  Hoarseness [ ]  Dysphagia Cardiac: [ ]  Chest pain/pressure, [x ] Shortness of breath at rest [ ]  Shortness of breath with exertion, [ ]  Atrial fibrillation or irregular heartbeat  Vascular: [ ]  Pain in legs with walking, [ ]  Pain in legs at rest, [ ]  Pain in legs at night,  [ ]  Non-healing ulcer, [ ]  Blood clot in vein/DVT,   Pulmonary: [ ]  Home oxygen, [ ]  Productive cough, [ ]  Coughing up blood, [ ]  Asthma,  [ ]  Wheezing [ ]  COPD Musculoskeletal:  [ ]  Arthritis, [ ]  Low back pain, [ ]  Joint pain Hematologic: [ ]  Easy Bruising, [ ]  Anemia; [ ]  Hepatitis Gastrointestinal: [ ]  Blood in stool, [ ]  Gastroesophageal Reflux/heartburn, Urinary: [x ] chronic Kidney disease, [ ]  on HD - [ ]  MWF or [ ]  TTHS, [ ]  Burning with urination, [ ]  Difficulty urinating Skin: [x ] Rashes, [x ] Wounds Psychological: [ ]  Anxiety, [ ]  Depression  Physical Examination Filed Vitals:   01/22/14 0720 01/22/14 0735 01/22/14 0740 01/22/14 1040  BP:  120/52   119/48  Pulse: 91     Temp:      TempSrc:      Resp: 14 10    Height:      Weight:  SpO2: 96% 73% 99%    Body mass index is 59.03 kg/(m^2).  General:  Morbidly obese HENT: WNL Eyes: Pupils equal Pulmonary: normal non-labored breathing , without Rales, rhonchi,  wheezing Cardiac: RRR, without  Murmurs, rubs or gallops; No carotid bruits Abdomen: soft, NT, no masses Skin: positive thickened dry skin.  Lateral right leg 3 cm X 2 cm and 2 cm X 1 cm areas of full thickness ulcers.  No active drainage, no malodor.   Vascular Exam/Pulses:Doppler DP no signal for PT. Foot is warm and toes are pink in color  Left stump with skin changes now.  No openings in the healed stump.   Musculoskeletal: no muscle wasting or atrophy; positive edema  Neurologic: A&O X 3; Appropriate Affect ;  SENSATION: normal; MOTOR FUNCTION: 3+/5 Symmetric Speech is fluent/normal   Significant Diagnostic Studies: CBC Lab Results  Component Value Date   WBC 10.1 01/22/2014   HGB 8.3* 01/22/2014   HCT 26.4* 01/22/2014   MCV 90.4 01/22/2014   PLT 200 01/22/2014    BMET    Component Value Date/Time   NA 141 01/22/2014 0800   K 4.2 01/22/2014 0800   CL 109 01/22/2014 0800   CO2 12* 01/22/2014 0800   GLUCOSE 116* 01/22/2014 0800   BUN 81* 01/22/2014 0800   CREATININE 4.56* 01/22/2014 0800   CALCIUM 8.5 01/22/2014 0800   GFRNONAA 14* 01/22/2014 0800   GFRAA 17* 01/22/2014 0800   Estimated Creatinine Clearance: 37.7 ml/min (by C-G formula based on Cr of 4.56).  COAG Lab Results  Component Value Date   INR 2.00* 01/22/2014   INR 2.24* 01/22/2014     Non-Invasive Vascular Imaging: No studies at this time  ASSESSMENT/PLAN:  Chronic Venous ulcers with chronic skin changes Right lateral leg.    Clinton Gallant [facility] 01/22/2014 12:42 PM  I have examined the patient, reviewed and agree with above. Very difficult problem. The patient had a left below-knee amputation several years ago with complications of venous  stasis disease and possibly lymphedema. He presents now with altered mental status and possible infection of these venous wounds. He does have biphasic dorsalis pedis signal with no evidence of arterial insufficiency. He has chronic skin changes of lymphedema extending down to the dorsum of his foot. He is not communicating currently due to his level of illness. His wife is at the bedside and is providing history. He apparently has not walked for several years and has not been able to stand and transfer for several years. I do not see any evidence of undrained subcutaneous abscess. Suspect that his sepsis is related to his lymphedema and his infection.  This will resolve quickly with antibiotic treatment. Would recommend wound care consult for his chronic wound. Do not see any need a role for arterial revascularization. Did discuss with the wife that this could progress to limb loss but that it wouldn't make minimal difference in his quality of life since he is transferred with slide board from bed to chair currently. His morbid obesity greater than 400 pounds and make all care is more difficult. Will follow with you.  Byrant Valent, MD 01/22/2014 3:39 PM

## 2014-01-22 NOTE — Procedures (Signed)
Supervised procedure at bedside. Real time 2D ultrasound used for vein site selection, patency assessment, and needle entry. / A record of image was made but could not be submitted for filing due to malfunction of printing device   Dr. Kalman ShanMurali Canesha Tesfaye, M.D., [facility] Pulmonary and Critical Care Medicine Staff Physician [facility] Pager: [phone], If no answer or between  15:00h - 7:00h: call [phone]  01/22/2014 7:22 PM

## 2014-01-22 NOTE — Progress Notes (Signed)
Utilization review completed.  

## 2014-01-22 NOTE — Procedures (Signed)
HD Catheter Insertion Procedure Note Marry Guanhomas Salo [id number] Aug 04, 1971  Procedure: Insertion of HD Catheter Indications: Hemodialysis  Procedure Details Consent: Risks of procedure as well as the alternatives and risks of each were explained to the (patient/caregiver).  Consent for procedure obtained. Time Out: Verified patient identification, verified procedure, site/side was marked, verified correct patient position, special equipment/implants available, medications/allergies/relevent history reviewed, required imaging and test results available.  Performed  Maximum sterile technique was used including antiseptics, cap, gloves, gown, hand hygiene, mask and sheet. Skin prep: Chlorhexidine; local anesthetic administered A guidewire was placed through the TLC previously placed, the TLC was removed and disposed of.  Keeping a sterile field, the new antimicrobial bonded/coated triple lumen HD catheter was placed in the left internal jugular vein using the Seldinger technique.  Evaluation Blood flow good Complications: No apparent complications Patient did tolerate procedure well. Chest X-ray ordered to verify placement.  CXR: pending.  Rutherford Guysahul Elford Evilsizer, PA - C [place] [facility] Pgr: [phone]  or [phone]

## 2014-01-22 NOTE — Progress Notes (Signed)
9:19 AM I agree with HPI/GPe and A/P per Dr. 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  He started to have the odour from the wound-Wound first noticed ~ 1 month ago.  His R leg has been "dark " for several months.  He was seen by a wound nurse for this. He also has wounds on his bottom which have been persistent since Jan 2015 since ICU [facility] for 2 weeks for PNA and h. flu-Went in for routine cataract surgery-Had Htn crisis and noted LVH severe then He has not really been himself as usual Supposed to have dialysis shunt placed as an outpatient couple 02/04/14-Dr. [name] is as primary nephrologist      HEENT-lethargic, arouses, tremulou CHEST-clear to auscultation  CARDIAC- S1-S2 no murmur rub or gallop ABDOMEN-obese nontender nondistended  NEURO- intact however asterixis noted, brisk reflexes  SKIN/MUSCULAR-see below picture in addition patient has raised decubiti on his sacrum  I can appreciate popliteal pulses but because of brawny edema to right lower extremity, I'm not able to feel dorsalis pedis     Patient Active Problem List   Diagnosis Date Noted  . Metabolic acidosis 68/34/1964  . Acute encephalopathy 01/22/2014  . CKD (chronic kidney  disease) 01/22/2014  . Leg ulcer 01/22/2014  . Diabetes mellitus 01/22/2014  . Anemia 01/22/2014  . Gout 01/22/2014  . HTN (hypertension) 01/22/2014  . History of DVT (deep vein thrombosis) 01/22/2014  . End stage renal disease 01/11/2014   1. Acute toxic metabolic encephalopathy-secondary to infection or impending uremia-see below 2. Acute metabolic acidosis, multifactorial, DDX = sepsis ? Impending uremia-agree with broad-spectrum antibiotics started by admitting physician. Nephrology consulted-patient to have HD catheter placed and will receive dialysis-continue bicarbonate GTT 75 cc per hour-net 700 cc out so far.  Daily weights Greatly appreciate nephrology, critical care input  3. ESRD -see above  4. Probable diabetic wound right side -vascular surgery consulted and appreciate input -patient may eventually end up with another amputation unfortunately and we will discuss this realistic they with the family.  Suspect he will need relatively longer course of antibiotic~2 weeks.  Continue vancomycin and Zosyn for now-potentially will narrow in one to 2 days. 5. Lymphedema -multifactorial , potentially secondary to post DVT swelling 6. History DVT , status post filter placement -continue IV heparin for now . Await dialysis and then decision on and no a.c.  7. Diabetes mellitus-blood sugars 1:15 to 120, A1c 5.8-graduate to diabetic diet if patient oriented enough to eat . Change coverage to 4 times a day a.c. at bedtime insulin.  No long-acting since ESRD  8. Hypertension-continue amlodipine 10 daily, Coreg 25 twice a day, clonidine 0.2 twice a day, hydralazine 100 3 times a day.   9. Metabolic bone disease -continue Ventolin 800 3 times a day  10.  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 May need TSH, IV Iron-obtain iron studies 11. Super Morbid obesity, Body mass index is 59.03 kg/(m^2).-life threatening  Prognosis is guarded. Step down Will  discuss further with family a.m.  Verneita Griffes, MD [facility] [phone]

## 2014-01-22 NOTE — ED Notes (Signed)
Pt's wife cell number [phone]

## 2014-01-22 NOTE — ED Notes (Addendum)
i-Stat CG4+ result <0.8630mmol/L given to Dr. Ranae PalmsYelverton

## 2014-01-22 NOTE — Consult Note (Signed)
Asked by Dr. Raliegh Ip to see this 42 year old male with stage 5 CKD, creat 3.97 on 12/18/13 probably due to diabetes.  He has a history of chronic venous stasis ulcer disease requiring an amputation of the left leg in 0076 complicated by a BKA and hemodialysis for aboput a month.  He is morbidly obese and lives at a SNF in [facility]  He was seen on 12/18/13 by Dr. [name] and referred for AV access which has not been placed although scheduled for 02/04/14.  He presented last night with altered mental status, fever and metabolic acidosis with ph 7.1.  Bicarb was 11, BUB 83 and creat 4.71 He does have a wound in his RLE.  Renal is asked to assist with treatment.  Past Medical History  Diagnosis Date  . Anemia   . Diabetes mellitus without complication   . GERD (gastroesophageal reflux disease)   . Hypertension   . Venous stasis ulcers   . Morbid obesity   . Renal disorder    Past Surgical History  Procedure Laterality Date  . Below knee leg amputation Left    Social History:  reports that he has never smoked. He has never used smokeless tobacco. He reports that he does not drink alcohol or use illicit drugs. Allergies:  Allergies  Allergen Reactions  . Omnipaque [Iohexol]    Family History  Problem Relation Age of Onset  . Diabetes Mother   . Hyperlipidemia Mother   . Hypertension Mother     Medications:  Scheduled: . allopurinol  100 mg Oral Daily  . amLODipine  10 mg Oral Daily  . b complex-vitamin c-folic acid  1 tablet Oral QHS  . carvedilol  25 mg Oral BID WC  . cholecalciferol  5,000 Units Oral Once per day on Mon Tue Wed Thu Fri  . cloNIDine  0.2 mg Oral BID  . ferrous gluconate  324 mg Oral BID WC  . fluticasone  2 spray Each Nare Daily  . hydrALAZINE  100 mg Oral TID  . insulin aspart  0-9 Units Subcutaneous TID WC  . loratadine  10 mg Oral Daily  . pantoprazole  40 mg Oral Daily  . pentoxifylline  400 mg Oral TID WC  . piperacillin-tazobactam (ZOSYN)  IV  3.375 g  Intravenous 3 times per day  . prednisoLONE acetate  1 drop Left Eye QID  . sodium bicarbonate      . sodium chloride  3 mL Intravenous Q12H   ROS:non obtainable due to lethargy  Blood pressure 119/48, pulse 91, temperature 97.9 F (36.6 C), temperature source Oral, resp. rate 10, height 6' 1"  (1.854 m), weight 202.9 kg (447 lb 5 oz), SpO2 99.00%.  General appearance: slowed mentation Head: Normocephalic, without obvious abnormality, atraumatic Eyes: negative Ears: normal TM's and external ear canals both ears Nose: Nares normal. Septum midline. Mucosa normal. No drainage or sinus tenderness. Throat: lips, mucosa, and tongue normal; teeth and gums normal Resp: clear to auscultation bilaterally Chest wall: no tenderness Cardio: regular rate and rhythm, S1, S2 normal, no murmur, click, rub or gallop GI: massive Extremities: edema 2-+ and chronic chages with flaking skin, right pretibial region bandaged, left BKA Skin: scaly as above Neurologic: Mental status: depressed mentation, but arouse Results for orders placed during the hospital encounter of 01/21/14 (from the past 48 hour(s))  CBG MONITORING, ED     Status: Abnormal   Collection Time    01/21/14 11:16 PM      Result  Value Ref Range   Glucose-Capillary 134 (*) 70 - 99 mg/dL  CBC WITH DIFFERENTIAL     Status: Abnormal   Collection Time    01/22/14 12:05 AM      Result Value Ref Range   WBC 10.7 (*) 4.0 - 10.5 K/uL   RBC 2.99 (*) 4.22 - 5.81 MIL/uL   Hemoglobin 8.5 (*) 13.0 - 17.0 g/dL   HCT 27.4 (*) 39.0 - 52.0 %   MCV 91.6  78.0 - 100.0 fL   MCH 28.4  26.0 - 34.0 pg   MCHC 31.0  30.0 - 36.0 g/dL   RDW 17.5 (*) 11.5 - 15.5 %   Platelets 203  150 - 400 K/uL   Neutrophils Relative % 83 (*) 43 - 77 %   Lymphocytes Relative 8 (*) 12 - 46 %   Monocytes Relative 7  3 - 12 %   Eosinophils Relative 1  0 - 5 %   Basophils Relative 1  0 - 1 %   Neutro Abs 8.8 (*) 1.7 - 7.7 K/uL   Lymphs Abs 0.9  0.7 - 4.0 K/uL   Monocytes  Absolute 0.8  0.1 - 1.0 K/uL   Eosinophils Absolute 0.1  0.0 - 0.7 K/uL   Basophils Absolute 0.1  0.0 - 0.1 K/uL   RBC Morphology TEARDROP CELLS     WBC Morphology ATYPICAL LYMPHOCYTES    COMPREHENSIVE METABOLIC PANEL     Status: Abnormal   Collection Time    01/22/14 12:05 AM      Result Value Ref Range   Sodium 136 (*) 137 - 147 mEq/L   Potassium 4.4  3.7 - 5.3 mEq/L   Chloride 105  96 - 112 mEq/L   CO2 11 (*) 19 - 32 mEq/L   Glucose, Bld 119 (*) 70 - 99 mg/dL   BUN 83 (*) 6 - 23 mg/dL   Creatinine, Ser 4.71 (*) 0.50 - 1.35 mg/dL   Calcium 8.7  8.4 - 10.5 mg/dL   Total Protein 7.9  6.0 - 8.3 g/dL   Albumin 3.2 (*) 3.5 - 5.2 g/dL   AST 16  0 - 37 U/L   ALT <5  0 - 53 U/L   Alkaline Phosphatase 105  39 - 117 U/L   Total Bilirubin 0.3  0.3 - 1.2 mg/dL   GFR calc non Af Amer 14 (*) >90 mL/min   GFR calc Af Amer 16 (*) >90 mL/min   Comment: (NOTE)     The eGFR has been calculated using the CKD EPI equation.     This calculation has not been validated in all clinical situations.     eGFR's persistently <90 mL/min signify possible Chronic Kidney     Disease.  TROPONIN I     Status: None   Collection Time    01/22/14 12:05 AM      Result Value Ref Range   Troponin I <0.30  <0.30 ng/mL   Comment:            Due to the release kinetics of cTnI,     a negative result within the first hours     of the onset of symptoms does not rule out     myocardial infarction with certainty.     If myocardial infarction is still suspected,     repeat the test at appropriate intervals.  APTT     Status: Abnormal   Collection Time    01/22/14  1:58 AM  Result Value Ref Range   aPTT 54 (*) 24 - 37 seconds   Comment:            IF BASELINE aPTT IS ELEVATED,     SUGGEST PATIENT RISK ASSESSMENT     BE USED TO DETERMINE APPROPRIATE     ANTICOAGULANT THERAPY.  PROTIME-INR     Status: Abnormal   Collection Time    01/22/14  1:58 AM      Result Value Ref Range   Prothrombin Time 24.1 (*)  11.6 - 15.2 seconds   INR 2.24 (*) 0.00 - 1.49  TYPE AND SCREEN     Status: None   Collection Time    01/22/14  1:58 AM      Result Value Ref Range   ABO/RH(D) AB POS     Antibody Screen NEG     Sample Expiration 01/25/2014    ABO/RH     Status: None   Collection Time    01/22/14  1:58 AM      Result Value Ref Range   ABO/RH(D) AB POS    I-STAT CG4 LACTIC ACID, ED     Status: Abnormal   Collection Time    01/22/14  2:07 AM      Result Value Ref Range   Lactic Acid, Venous <0.30 (*) 0.5 - 2.2 mmol/L  POC OCCULT BLOOD, ED     Status: None   Collection Time    01/22/14  2:34 AM      Result Value Ref Range   Fecal Occult Bld NEGATIVE  NEGATIVE  I-STAT ARTERIAL BLOOD GAS, ED     Status: Abnormal   Collection Time    01/22/14  2:55 AM      Result Value Ref Range   pH, Arterial 7.116 (*) 7.350 - 7.450   pCO2 arterial 32.5 (*) 35.0 - 45.0 mmHg   pO2, Arterial 103.0 (*) 80.0 - 100.0 mmHg   Bicarbonate 10.5 (*) 20.0 - 24.0 mEq/L   TCO2 11  0 - 100 mmol/L   O2 Saturation 95.0     Acid-base deficit 18.0 (*) 0.0 - 2.0 mmol/L   Patient temperature 98.6 F     Collection site RADIAL, ALLEN'S TEST ACCEPTABLE     Drawn by Operator     Sample type ARTERIAL     Comment NOTIFIED PHYSICIAN    KETONES, QUALITATIVE     Status: Abnormal   Collection Time    01/22/14  4:29 AM      Result Value Ref Range   Acetone, Bld SMALL (*) NEGATIVE  SALICYLATE LEVEL     Status: Abnormal   Collection Time    01/22/14  4:29 AM      Result Value Ref Range   Salicylate Lvl <6.6 (*) 2.8 - 20.0 mg/dL  URINALYSIS, ROUTINE W REFLEX MICROSCOPIC     Status: Abnormal   Collection Time    01/22/14  6:29 AM      Result Value Ref Range   Color, Urine YELLOW  YELLOW   APPearance CLEAR  CLEAR   Specific Gravity, Urine 1.023  1.005 - 1.030   pH 5.0  5.0 - 8.0   Glucose, UA NEGATIVE  NEGATIVE mg/dL   Hgb urine dipstick NEGATIVE  NEGATIVE   Bilirubin Urine SMALL (*) NEGATIVE   Ketones, ur 15 (*) NEGATIVE mg/dL    Protein, ur 100 (*) NEGATIVE mg/dL   Urobilinogen, UA 0.2  0.0 - 1.0 mg/dL   Nitrite NEGATIVE  NEGATIVE  Leukocytes, UA NEGATIVE  NEGATIVE  URINE MICROSCOPIC-ADD ON     Status: Abnormal   Collection Time    01/22/14  6:29 AM      Result Value Ref Range   Squamous Epithelial / LPF RARE  RARE   WBC, UA 3-6  <3 WBC/hpf   RBC / HPF 0-2  <3 RBC/hpf   Bacteria, UA FEW (*) RARE   Casts HYALINE CASTS (*) NEGATIVE  GLUCOSE, CAPILLARY     Status: Abnormal   Collection Time    01/22/14  7:14 AM      Result Value Ref Range   Glucose-Capillary 119 (*) 70 - 99 mg/dL   Comment 1 Notify RN     Comment 2 Documented in Chart    MRSA PCR SCREENING     Status: None   Collection Time    01/22/14  7:22 AM      Result Value Ref Range   MRSA by PCR NEGATIVE  NEGATIVE   Comment:            The GeneXpert MRSA Assay (FDA     approved for NASAL specimens     only), is one component of a     comprehensive MRSA colonization     surveillance program. It is not     intended to diagnose MRSA     infection nor to guide or     monitor treatment for     MRSA infections.  COMPREHENSIVE METABOLIC PANEL     Status: Abnormal   Collection Time    01/22/14  8:00 AM      Result Value Ref Range   Sodium 141  137 - 147 mEq/L   Potassium 4.2  3.7 - 5.3 mEq/L   Chloride 109  96 - 112 mEq/L   CO2 12 (*) 19 - 32 mEq/L   Glucose, Bld 116 (*) 70 - 99 mg/dL   BUN 81 (*) 6 - 23 mg/dL   Creatinine, Ser 4.56 (*) 0.50 - 1.35 mg/dL   Calcium 8.5  8.4 - 10.5 mg/dL   Total Protein 8.0  6.0 - 8.3 g/dL   Albumin 3.1 (*) 3.5 - 5.2 g/dL   AST 14  0 - 37 U/L   ALT <5  0 - 53 U/L   Alkaline Phosphatase 105  39 - 117 U/L   Total Bilirubin 0.3  0.3 - 1.2 mg/dL   GFR calc non Af Amer 14 (*) >90 mL/min   GFR calc Af Amer 17 (*) >90 mL/min   Comment: (NOTE)     The eGFR has been calculated using the CKD EPI equation.     This calculation has not been validated in all clinical situations.     eGFR's persistently <90 mL/min  signify possible Chronic Kidney     Disease.  CBC WITH DIFFERENTIAL     Status: Abnormal   Collection Time    01/22/14  8:00 AM      Result Value Ref Range   WBC 10.1  4.0 - 10.5 K/uL   RBC 2.92 (*) 4.22 - 5.81 MIL/uL   Hemoglobin 8.3 (*) 13.0 - 17.0 g/dL   HCT 26.4 (*) 39.0 - 52.0 %   MCV 90.4  78.0 - 100.0 fL   MCH 28.4  26.0 - 34.0 pg   MCHC 31.4  30.0 - 36.0 g/dL   RDW 17.8 (*) 11.5 - 15.5 %   Platelets 200  150 - 400 K/uL   Neutrophils Relative % 80 (*)  43 - 77 %   Lymphocytes Relative 10 (*) 12 - 46 %   Monocytes Relative 8  3 - 12 %   Eosinophils Relative 1  0 - 5 %   Basophils Relative 1  0 - 1 %   Neutro Abs 8.1 (*) 1.7 - 7.7 K/uL   Lymphs Abs 1.0  0.7 - 4.0 K/uL   Monocytes Absolute 0.8  0.1 - 1.0 K/uL   Eosinophils Absolute 0.1  0.0 - 0.7 K/uL   Basophils Absolute 0.1  0.0 - 0.1 K/uL  PHOSPHORUS     Status: Abnormal   Collection Time    01/22/14  8:00 AM      Result Value Ref Range   Phosphorus 6.4 (*) 2.3 - 4.6 mg/dL  TSH     Status: Abnormal   Collection Time    01/22/14  8:00 AM      Result Value Ref Range   TSH 0.182 (*) 0.350 - 4.500 uIU/mL   Comment: Performed at [place] A1C     Status: Abnormal   Collection Time    01/22/14  8:00 AM      Result Value Ref Range   Hemoglobin A1C 5.8 (*) <5.7 %   Comment: (NOTE)                                                                               According to the ADA Clinical Practice Recommendations for 2011, when     HbA1c is used as a screening test:      >=6.5%   Diagnostic of Diabetes Mellitus               (if abnormal result is confirmed)     5.7-6.4%   Increased risk of developing Diabetes Mellitus     References:Diagnosis and Classification of Diabetes Mellitus,Diabetes     TKWI,0973,53(GDJME 1):S62-S69 and Standards of Medical Care in             Diabetes - 2011,Diabetes Care,2011,34 (Suppl 1):S11-S61.   Mean Plasma Glucose 120 (*) <117 mg/dL   Comment: Performed at [facility]     Status: Abnormal   Collection Time    01/22/14  8:00 AM      Result Value Ref Range   Prothrombin Time 22.1 (*) 11.6 - 15.2 seconds   INR 2.00 (*) 0.00 - 1.49  GLUCOSE, CAPILLARY     Status: Abnormal   Collection Time    01/22/14  9:23 AM      Result Value Ref Range   Glucose-Capillary 115 (*) 70 - 99 mg/dL   US Renal  01/22/2014   CLINICAL DATA:  Renal failure.  Diabetic.  Hypertension.  EXAM: RENAL/URINARY TRACT ULTRASOUND COMPLETE  COMPARISON:  None.  FINDINGS: Right Kidney:  Length: 12.3 cm. No hydronephrosis or mass identified. Renal parenchymal thinning  Left Kidney:  Length: 13.5 cm. No hydronephrosis or mass identified. Renal parenchymal thinning.  Bladder:  Bladder is full without mass identified.  Prevoid volume of 1072 cc.  IMPRESSION: No hydronephrosis.  Renal parenchymal thinning bilaterally.  Bladder is full with prevoid volume of 1072 cc.   Electronically Signed  By: Chauncey Cruel M.D.   On: 01/22/2014 06:07   Dg Chest Port 1 View  01/22/2014   CLINICAL DATA:  Line placement  EXAM: PORTABLE CHEST - 1 VIEW  COMPARISON:  Insert scratch pad earlier film of the same day  FINDINGS: Left IJ central line extends to the cavoatrial junction. No pneumothorax evident. Prominent perihilar interstitial markings. No effusion. Heart size upper limits normal. .  IMPRESSION: 1. Central line to cavoatrial junction without pneumothorax.   Electronically Signed   By: Arne Cleveland M.D.   On: 01/22/2014 13:42   Dg Chest Port 1 View  01/22/2014   CLINICAL DATA:  Altered mental status. Fever. Diabetic. High blood pressure  EXAM: PORTABLE CHEST - 1 VIEW  COMPARISON:  05/23/2013.  FINDINGS: Cardiomegaly.  Central pulmonary vascular prominence.  No gross pneumothorax or segmental consolidation.  The patient would eventually benefit from two view chest.  IMPRESSION: Cardiomegaly.  Central pulmonary vascular prominence.   Electronically Signed   By: Chauncey Cruel M.D.   On:  01/22/2014 01:00   Assessment:  1 Stage 5 CKD with uremic symptoms and metabolic acidosis 2 Morbid obesity 3 Bed bound and lives in SNF currently 4 Diabetes m with complications  Plan: 1 Wife wants everything done.  I told her that the bed bound status is problematic and will pose a problem if he cannot be transported or transfer or use a reclining wheelchair for out pt dialysis.  He may need dialysis in another state to receive dialysis in a SNF where there is a dialysis unit. 2 I have asked CCM to place HD catheter and we will start hemodialysis and proceed from there  Severance C 01/22/2014, 3:45 PM

## 2014-01-22 NOTE — Progress Notes (Addendum)
ANTICOAGULATION CONSULT NOTE - Follow Up Consult  Pharmacy Consult for heparin Indication: history of DVT  Allergies  Allergen Reactions  . Omnipaque [Iohexol]     Patient Measurements: Height: 6\' 1"  (185.4 cm) Weight: 447 lb 5 oz (202.9 kg) IBW/kg (Calculated) : 79.9  Vital Signs: Temp: 97.9 F (36.6 C) (03/06 0710) Temp src: Oral (03/06 0710) BP: 119/48 mmHg (03/06 1040) Pulse Rate: 91 (03/06 0720)  Labs:  Recent Labs  01/22/14 0005 01/22/14 0158 01/22/14 0800  HGB 8.5*  --  8.3*  HCT 27.4*  --  26.4*  PLT 203  --  200  APTT  --  54*  --   LABPROT  --  24.1* 22.1*  INR  --  2.24* 2.00*  CREATININE 4.71*  --  4.56*  TROPONINI <0.30  --   --     Estimated Creatinine Clearance: 37.7 ml/min (by C-G formula based on Cr of 4.56).   Medications:  Scheduled:  . allopurinol  100 mg Oral Daily  . amLODipine  10 mg Oral Daily  . b complex-vitamin c-folic acid  1 tablet Oral QHS  . carvedilol  25 mg Oral BID WC  . cholecalciferol  5,000 Units Oral Once per day on Mon Tue Wed Thu Fri  . cloNIDine  0.2 mg Oral BID  . ferrous gluconate  324 mg Oral BID WC  . fluticasone  2 spray Each Nare Daily  . hydrALAZINE  100 mg Oral TID  . insulin aspart  0-9 Units Subcutaneous TID WC  . loratadine  10 mg Oral Daily  . pantoprazole  40 mg Oral Daily  . pentoxifylline  400 mg Oral TID WC  . piperacillin-tazobactam (ZOSYN)  IV  3.375 g Intravenous 3 times per day  . prednisoLONE acetate  1 drop Left Eye QID  . sodium bicarbonate      . sodium chloride  3 mL Intravenous Q12H   Infusions:  . heparin 2,000 Units/hr (01/22/14 1128)  . 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 Patient here with fever and AMS with R Leg wound infection.  According to [facility] last dose of Xarelto was 01/20/14.  SC[facility] 4.56, [facility] 40 [facility].32-4.37 (1/17-1/21) and patient noted for HD. Pharmacy has been asked to dose heparin.   Heparin started this am at 10am and stopped  At about 2:30 today for line placement. Discussed with MD and ok to restart heparin after line placement has been confirmed (this has been done)   Goal of Therapy:  Heparin level 0.3-0.7 units/ml aPTT 66-102 seconds Monitor platelets by anticoagulation protocol: Yes   Plan:  -Will restart heparin at 1500 units/hr -Heparin level and aPTT in 8 hrs -With HD status will need to consider transition to coumadin as able  Harland GermanAndrew Erisa Mehlman, Pharm D 01/22/2014 3:16 PM

## 2014-01-22 NOTE — Progress Notes (Addendum)
ANTIBIOTIC CONSULT NOTE - INITIAL  Pharmacy Consult for Vancomycin and Zosyn and Heparin Indication: cellulitis and h/o DVT  Allergies  Allergen Reactions  . Omnipaque [Iohexol]     Patient Measurements: Height: 6\' 1"  (185.4 cm) Weight: 447 lb 5 oz (202.9 kg) IBW/kg (Calculated) : 79.9 Adjusted Body Weight: 130 kg  Vital Signs: Temp: 97.9 F (36.6 C) (03/06 0710) Temp src: Oral (03/06 0710) BP: 104/49 mmHg (03/06 0600) Pulse Rate: 91 (03/06 0710) Intake/Output from previous day: 03/05 0701 - 03/06 0700 In: -  Out: 850 [Urine:850] Intake/Output from this shift:    Labs:  Recent Labs  01/22/14 0005  WBC 10.7*  HGB 8.5*  PLT 203  CREATININE 4.71*   Estimated Creatinine Clearance: 36.5 ml/min (by C-G formula based on Cr of 4.71). No results found for this basename: VANCOTROUGH, VANCOPEAK, VANCORANDOM, GENTTROUGH, GENTPEAK, GENTRANDOM, TOBRATROUGH, TOBRAPEAK, TOBRARND, AMIKACINPEAK, AMIKACINTROU, AMIKACIN,  in the last 72 hours   Microbiology: No results found for this or any previous visit (from the past 720 hour(s)).  Medical History: Past Medical History  Diagnosis Date  . Anemia   . Diabetes mellitus without complication   . GERD (gastroesophageal reflux disease)   . Hypertension   . Venous stasis ulcers   . Morbid obesity   . Renal disorder     Medications:  Prescriptions prior to admission  Medication Sig Dispense Refill  . acetaZOLAMIDE (DIAMOX) 500 MG capsule Take 500 mg by mouth 3 (three) times daily.      Marland Kitchen albuterol (ACCUNEB) 0.63 MG/3ML nebulizer solution Take 1 ampule by nebulization every 6 (six) hours as needed for wheezing.      Marland Kitchen allopurinol (ZYLOPRIM) 100 MG tablet Take 100 mg by mouth daily.      Marland Kitchen amLODipine (NORVASC) 10 MG tablet Take 10 mg by mouth daily.      . B Complex-C-Folic Acid (NEPHRO-VITE PO) Take by mouth daily.      . beta carotene ([employer] BETA CAROTENE) 16109 UNIT capsule Take 25,000 Units by mouth daily.      . carvedilol  (COREG) 25 MG tablet Take 25 mg by mouth 2 (two) times daily with a meal.      . Cholecalciferol (VITAMIN D-3) 5000 UNITS TABS Take 1 tablet by mouth. Monday-Friday      . cloNIDine (CATAPRES) 0.2 MG tablet Take 0.2 mg by mouth 2 (two) times daily.      . diphenhydrAMINE (SOMINEX) 25 MG tablet Take 25 mg by mouth at bedtime as needed for sleep.      . ferrous gluconate (FERGON) 324 MG tablet Take 324 mg by mouth 2 (two) times daily with a meal.      . fexofenadine (ALLEGRA) 180 MG tablet Take 180 mg by mouth daily.      . fluticasone (FLONASE) 50 MCG/ACT nasal spray Place 2 sprays into both nostrils daily.      Marland Kitchen gabapentin (NEURONTIN) 300 MG capsule Take 300 mg by mouth 3 (three) times daily.      . hydrALAZINE (APRESOLINE) 100 MG tablet Take 100 mg by mouth 3 (three) times daily.      . insulin glargine (LANTUS) 100 UNIT/ML injection Inject 30 Units into the skin at bedtime. As directed      . LORazepam (ATIVAN) 0.5 MG tablet Take 0.5 mg by mouth every 8 (eight) hours as needed for anxiety.      Marland Kitchen omeprazole (PRILOSEC) 20 MG capsule Take 20 mg by mouth daily.      Marland Kitchen  ondansetron (ZOFRAN) 4 MG tablet Take 4 mg by mouth every 8 (eight) hours as needed for nausea or vomiting.      . OxyCODONE (OXYCONTIN) 10 mg T12A 12 hr tablet Take 10 mg by mouth every 12 (twelve) hours.      . pentoxifylline (TRENTAL) 400 MG CR tablet Take 400 mg by mouth 3 (three) times daily with meals.      . polyethylene glycol (MIRALAX / GLYCOLAX) packet Take 17 g by mouth daily.      . prednisoLONE acetate (PRED FORTE) 1 % ophthalmic suspension Place 1 drop into the left eye 4 (four) times daily.      . promethazine (PHENERGAN) 25 MG suppository Place 25 mg rectally every 8 (eight) hours as needed for nausea or vomiting.      . Rivaroxaban (XARELTO) 20 MG TABS tablet Take 20 mg by mouth daily with supper.      . senna (SENOKOT) 8.6 MG tablet Take 2 tablets by mouth daily.      . sertraline (ZOLOFT) 25 MG tablet Take 75 mg  by mouth daily.      . simethicone (MYLICON) 80 MG chewable tablet Chew 80 mg by mouth every 6 (six) hours as needed for flatulence.      . traZODone (DESYREL) 100 MG tablet Take 100 mg by mouth at bedtime.       Assessment: 43 yo male with uremia/ARF, R LE wound infection for empiric antibiotics.  Vancomycin 1 g IV given in ED at  0200.   Pt's home Xarelto now on hold, to start heparin 24 hrs after last dose.  Goal of Therapy:  Vancomycin trough level 10-15 mcg/ml  Plan:  Vancomycin 1500 mg IV now for total of 2.5 g IV this morning.  F/U renal fxn Zosyn 3.375 g IV q8h   Check baseline Heparin level/PTT today at 4 pm, then start heparin 2000 units/hr at 1800 tonight  F/U am labs 3/7.  Angel Mays, Angel Mays 01/22/2014,7:21 AM

## 2014-01-23 LAB — RENAL FUNCTION PANEL
Albumin: 2.7 g/dL — ABNORMAL LOW (ref 3.5–5.2)
BUN: 54 mg/dL — AB (ref 6–23)
CALCIUM: 8.4 mg/dL (ref 8.4–10.5)
CHLORIDE: 105 meq/L (ref 96–112)
CO2: 17 meq/L — AB (ref 19–32)
CREATININE: 3.17 mg/dL — AB (ref 0.50–1.35)
GFR calc Af Amer: 26 mL/min — ABNORMAL LOW (ref >90)
GFR calc non Af Amer: 22 mL/min — ABNORMAL LOW (ref >90)
Glucose, Bld: 83 mg/dL (ref 70–99)
Phosphorus: 4 mg/dL (ref 2.3–4.6)
Potassium: 3.3 mEq/L — ABNORMAL LOW (ref 3.7–5.3)
Sodium: 141 mEq/L (ref 137–147)

## 2014-01-23 LAB — IRON AND TIBC
Iron: 78 ug/dL (ref 42–135)
Saturation Ratios: 52 % (ref 20–55)
TIBC: 149 ug/dL — AB (ref 215–435)
UIBC: 71 ug/dL — AB (ref 125–400)

## 2014-01-23 LAB — GLUCOSE, CAPILLARY
GLUCOSE-CAPILLARY: 109 mg/dL — AB (ref 70–99)
GLUCOSE-CAPILLARY: 102 mg/dL — AB (ref 70–99)
Glucose-Capillary: 92 mg/dL (ref 70–99)
Glucose-Capillary: 85 mg/dL (ref 70–99)
Glucose-Capillary: 112 mg/dL — ABNORMAL HIGH (ref 70–99)

## 2014-01-23 LAB — HEPARIN LEVEL (UNFRACTIONATED)
HEPARIN UNFRACTIONATED: 0.16 [IU]/mL — AB (ref 0.30–0.70)
Heparin Unfractionated: 0.5 IU/mL (ref 0.30–0.70)

## 2014-01-23 LAB — CBC
HEMATOCRIT: 23.7 % — AB (ref 39.0–52.0)
Hemoglobin: 7.6 g/dL — ABNORMAL LOW (ref 13.0–17.0)
MCH: 27.8 pg (ref 26.0–34.0)
MCHC: 32.1 g/dL (ref 30.0–36.0)
MCV: 86.8 fL (ref 78.0–100.0)
Platelets: 225 10*3/uL (ref 150–400)
RBC: 2.73 MIL/uL — AB (ref 4.22–5.81)
RDW: 17.6 % — ABNORMAL HIGH (ref 11.5–15.5)
WBC: 8.3 10*3/uL (ref 4.0–10.5)

## 2014-01-23 LAB — FERRITIN: FERRITIN: 441 ng/mL — AB (ref 22–322)

## 2014-01-23 LAB — HEPATITIS B SURFACE ANTIBODY,QUALITATIVE: Hep B S Ab: NEGATIVE

## 2014-01-23 LAB — APTT: aPTT: 143 seconds — ABNORMAL HIGH (ref 24–37)

## 2014-01-23 LAB — HEPATITIS B CORE ANTIBODY, TOTAL: HEP B C TOTAL AB: NONREACTIVE

## 2014-01-23 LAB — FOLATE: Folate: 8.4 ng/mL

## 2014-01-23 LAB — VITAMIN B12: Vitamin B-12: 525 pg/mL (ref 211–911)

## 2014-01-23 LAB — HEPATITIS B SURFACE ANTIGEN: Hepatitis B Surface Ag: NEGATIVE

## 2014-01-23 MED ORDER — VANCOMYCIN HCL IN DEXTROSE 1-5 GM/200ML-% IV SOLN
1000.0000 mg | Freq: Once | INTRAVENOUS | Status: AC
  Administered 2014-01-23: 1000 mg via INTRAVENOUS
  Filled 2014-01-23: qty 200

## 2014-01-23 MED ORDER — APIXABAN 5 MG PO TABS
5.0000 mg | ORAL_TABLET | Freq: Two times a day (BID) | ORAL | Status: DC
  Administered 2014-01-23 – 2014-01-28 (×10): 5 mg via ORAL
  Filled 2014-01-23 (×11): qty 1

## 2014-01-23 MED ORDER — HEPARIN SODIUM (PORCINE) 1000 UNIT/ML DIALYSIS: 20.0000 [IU]/kg | INTRAMUSCULAR | Status: DC | PRN

## 2014-01-23 NOTE — Progress Notes (Signed)
Note: This document was prepared with digital dictation and possible smart phrase technology. Any transcriptional errors that result from this process are unintentional.   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 His R leg has been "dark " for several months. He was seen by a wound nurse for this.  He also has wounds on his bottom which have been persistent since Jan 2015 since ICU [facility] for 2 weeks for PNA and h. flu-Went in for routine cataract surgery-Had Htn crisis and noted LVH severe then  He has not really been himself as usual  Supposed to have dialysis shunt placed as an outpatient couple 02/04/14-Dr. [name] is as primary nephrologist   Labs on admit=  PH 7.116, PCO2 32.5, PO2 103 sodium 136, BUN 83, creatinine 4.7 White count 10.7, hemoglobin 8.5, INR 2.24 UA = ketones, hyaline casts Renal ultrasound-renal parenchymal thinning Chest x-ray = cardio megaly, central pulmonary vascular prominence CT head 3/6 = no abnormality   Past medical history-As per Problem list Chart reviewed as below-   Consultants:  Critical care made aware-patient stable however  Vascular surgery  Nephrology  Wound nurse  Procedures:  3/6 Triple lumen HDcatheter placed left internal jugular vein  Antibiotics:  Vancomycin 3/5  Zosyn 3/6   Subjective  Confused to an extent. Doesn't know which hospital he is in [facility] to  some extent. Emotional about possible need for dialysis    Objective    Interim History: none  Telemetry: Sinus   Objective: Filed Vitals:   01/23/14 0115 01/23/14 0155 01/23/14 0409 01/23/14 0824  BP: 139/63 138/55 121/53   Pulse: 87 92 91   Temp: 98.4 F (36.9 C) 97.4 F (36.3 C) 99.2 F (37.3 C) 99.2 F (37.3 C)  TempSrc:  Axillary Axillary Oral  Resp: 14 15 14    Height:      Weight: 202.3 kg (445 lb 15.9 oz)     SpO2: 99% 98% 100%     Intake/Output Summary (Last 24 hours) at 01/23/14 0849 Last data filed at 01/23/14 0700  Gross per 24 hour  Intake 2036.08 ml  Output   5850 ml  Net -3813.92 ml    Exam:  HEENT-lethargic, arouses, tremulous still  CHEST-clear to auscultation  CARDIAC- S1-S2 no murmur rub or gallop  ABDOMEN-obese nontender nondistended  NEURO- intact however asterixis noted, brisk reflexes  SKIN/MUSCULAR-not examined today I can appreciate popliteal pulses but because of brawny edema to right lower extremity, I'm not able to feel dorsalis pedis  Data Reviewed: Basic Metabolic Panel:  Recent Labs Lab 01/22/14 0005 01/22/14 0800 01/22/14 2130  NA 136* 141 140  K 4.4 4.2 4.1  CL 105 109 108  CO2 11* 12* 13*  GLUCOSE 119* 116* 115*  BUN 83* 81* 78*  CREATININE 4.71* 4.56* 4.19*  CALCIUM 8.7 8.5 8.4  PHOS  --  6.4*  --    Liver Function Tests:  Recent Labs Lab 01/22/14 0005 01/22/14 0800  AST 16 14  ALT <5 <  5  ALKPHOS 105 105  BILITOT 0.3 0.3  PROT 7.9 8.0  ALBUMIN 3.2* 3.1*   No results found for this basename: LIPASE, AMYLASE,  in the last 168 hours No results found for this basename: AMMONIA,  in the last 168 hours CBC:  Recent Labs Lab 01/22/14 0005 01/22/14 0800 01/23/14 0110  WBC 10.7* 10.1 8.3  NEUTROABS 8.8* 8.1*  --   HGB 8.5* 8.3* 7.6*  HCT 27.4* 26.4* 23.7*  MCV 91.6 90.4 86.8  PLT 203 200 225   Cardiac Enzymes:  Recent Labs Lab 01/22/14 0005  TROPONINI <0.30   BNP: No components found with  this basename: POCBNP,  CBG:  Recent Labs Lab 01/22/14 0923 01/22/14 1612 01/22/14 2045 01/23/14 0207 01/23/14 0823  GLUCAP 115* 113* 111* 92 85    Recent Results (from the past 240 hour(s))  MRSA PCR SCREENING     Status: None   Collection Time    01/22/14  7:22 AM      Result Value Ref Range Status   MRSA by PCR NEGATIVE  NEGATIVE Final   Comment:            The GeneXpert MRSA Assay (FDA     approved for NASAL specimens     only), is one component of a     comprehensive MRSA colonization     surveillance program. It is not     intended to diagnose MRSA     infection nor to guide or     monitor treatment for     MRSA infections.     Studies:              All Imaging reviewed and is as per above notation   Scheduled Meds: . allopurinol  100 mg Oral Daily  . amLODipine  10 mg Oral Daily  . b complex-vitamin c-folic acid  1 tablet Oral QHS  . carvedilol  25 mg Oral BID WC  . cholecalciferol  5,000 Units Oral Once per day on Mon Tue Wed Thu Fri  . cloNIDine  0.2 mg Oral BID  . ferrous gluconate  324 mg Oral BID WC  . fluticasone  2 spray Each Nare Daily  . heparin  3,000 Units Intracatheter Once  . hydrALAZINE  100 mg Oral TID  . insulin aspart  0-9 Units Subcutaneous TID WC  . loratadine  10 mg Oral Daily  . pantoprazole  40 mg Oral Daily  . pentoxifylline  400 mg Oral TID WC  . piperacillin-tazobactam (ZOSYN)  IV  3.375 g Intravenous 3 times per day  . prednisoLONE acetate  1 drop Left Eye QID  . sevelamer carbonate  800 mg Oral TID WC  . sodium chloride  3 mL Intravenous Q12H  . vancomycin  1,000 mg Intravenous Once   Continuous Infusions: . heparin 1,500 Units/hr (01/23/14 0500)  . sodium chloride 0.45 % 1,000 mL with sodium bicarbonate 75 mEq infusion 75 mL/hr at 01/23/14 0155     Assessment/Plan: 1. Acute toxic metabolic encephalopathy-secondary to infection or impending uremia-see below 2. Acute metabolic acidosis, multifactorial, DDX = sepsis ?  Impending uremia-Unlikely Salicyclate tox-Cont [id number]=32 now. Good UOP~--4.66 liters overnight.  Continue broad-spectrum Abx. Nephrology consulted-appreciate input into if need HD. Greatly appreciate consultant input 3. ESRD -see above-Tunneled HD cath placed 3/6 4. Probable diabetic/Lymphedema wound right side --patient may eventually end up with another amputation unfortunately and we will discuss this realistic they with the family. Suspect he will need relatively longer course  of antibiotic~2 weeks. Continue vancomycin and Zosyn for now-potentially will narrow in one to 2 days 5. Lymphedema -multifactorial , potentially secondary to post DVT swelling history  6. DVT , status post filter placement -since no imminent surgical procedure  Xarelto-->apixaban.  D/c Iv Heparin gtt.  7. Diabetes mellitus-blood sugars 1:15 to 120, A1c 5.8-graduate to diabetic diet if patient oriented enough to eat . Change coverage to 4 times a day a.c. at bedtime insulin. No long-acting since ESRD  8. Hypertension-continue amlodipine 10 daily, Coreg 25 twice a day, clonidine 0.2 twice a day, hydralazine 100 3 times a day.   9. Metabolic bone disease -continue Renvela 800 3 times a day  10. 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 defer ESA->Renal-TIBC 149--Might need IV iron-transfuse if Hb less than 7 on 3/8 11. Super Morbid obesity, Body mass index is 59.03 kg/(m^2).-life threatening 2. Debility-Patient bed bound at baseline-[place] decision making re: dialysis challenging.  Will d/w family later today   Code Status: FUll Family Communication: none + Disposition Plan: SDU   Verneita Griffes, MD  [facility] Pager [phone] 01/23/2014, 8:49 AM    LOS: 2 days

## 2014-01-23 NOTE — Progress Notes (Addendum)
ANTICOAGULATION ANTIBIOTIC - Follow Up Consult  Pharmacy Consult for Heparin  Indication: DVT, h/o   Allergies  Allergen Reactions  . Omnipaque [Iohexol]    Patient Measurements: Height: 6\' 1"  (185.4 cm) Weight: 445 lb 15.9 oz (202.3 kg) (took of 4 liters of fluid) IBW/kg (Calculated) : 79.9  Vital Signs: Temp: 99.2 F (37.3 C) (03/07 0409) Temp src: Axillary (03/07 0409) BP: 121/53 mmHg (03/07 0409) Pulse Rate: 91 (03/07 0409)  Labs:  Recent Labs  01/22/14 0005 01/22/14 0158 01/22/14 0800 01/22/14 2130 01/23/14 0110  HGB 8.5*  --  8.3*  --  7.6*  HCT 27.4*  --  26.4*  --  23.7*  PLT 203  --  200  --  225  APTT  --  54*  --   --  143*  LABPROT  --  24.1* 22.1*  --   --   INR  --  2.24* 2.00*  --   --   HEPARINUNFRC  --   --   --   --  0.50  CREATININE 4.71*  --  4.56* 4.19*  --   TROPONINI <0.30  --   --   --   --     Estimated Creatinine Clearance: 41 ml/min (by C-G formula based on Cr of 4.19).   Assessment: 43 y/o with h/o DVT, Xarelto PTA admitted 01/21/2014 with confusion and fever.  Pharmacy consulted to dose heparin.     PMH: CKD, gout, DM, HTN, DVT  Anticoagulation:  H/o DVT, heparin infusing, no bleeding noted, h/h trend down [facility]. (last xarelto dose was 3/4 at 9pm per [facility]) > now on HD will need to transition to warfarin for chronic anticoagulation  Infectious Disease: RLE cellulitis. WBC= trend down afeb.   3/6 zosyn 3/6 vanc (1500 mg + 1000mg  load given 3/6)  3/6 blood x2 ngtd  Nephrology: Severe metabolic acidosis. Stage 5 CKD, HD started 3/6 pm.     Goal of Therapy:  Heparin level 0.3-0.7 units/ml Monitor platelets by anticoagulation protocol: Yes   Plan:  -Vancomycin 1g IV x 1 today, follow up HD for further vancomycin dosing -Continue heparin at 1500 units/hr -0800 HL to confirm --With HD status will need to consider transition to coumadin as able -Daily CBC/HL -Monitor for bleeding   Thank  you for allowing pharmacy to be a part of this patients care team.  Angel Mays Pharm.D., BCPS Clinical Pharmacist 01/23/2014 7:50 AM Pager: [phone] Phone: [phone]   10:21 AM Spoke with RN Angel Mays.  No bleeding noted, note H/H trend down and VVS note indicates possible procedure.  Will increase cautiously to 1750 units/h and recheck heparin level in 8h.  Angel Mays   Changing IV heparin to apixaban 5 mg PO BID, Plan to start apixaban 5 mg PO BID at 6pm tonight.  Stop heparin at 6pm.    Angel Mays

## 2014-01-23 NOTE — Progress Notes (Signed)
Subjective: Interval History: none.. minimally responsive. Sleeping comfortably.  Objective: Vital signs in last 24 hours: Temp:  [97.4 F (36.3 C)-99.2 F (37.3 C)] 99.2 F (37.3 C) (03/07 0824) Pulse Rate:  [87-110] 91 (03/07 0409) Resp:  [12-21] 14 (03/07 0409) BP: (106-149)/(48-82) 121/53 mmHg (03/07 0409) SpO2:  [98 %-100 %] 100 % (03/07 0409) Weight:  [445 lb 15.9 oz (202.3 kg)-446 lb 6.9 oz (202.5 kg)] 445 lb 15.9 oz (202.3 kg) (03/07 0115)  Intake/Output from previous day: 03/06 0701 - 03/07 0700 In: 2036.1 [P.O.:240; I.V.:1146.1; IV Piggyback:650] Out: 5850 [Urine:1850] Intake/Output this shift:    No change in the right lateral wound.  Lab Results:  Recent Labs  01/22/14 0800 01/23/14 0110  WBC 10.1 8.3  HGB 8.3* 7.6*  HCT 26.4* 23.7*  PLT 200 225   BMET  Recent Labs  01/22/14 0800 01/22/14 2130  NA 141 140  K 4.2 4.1  CL 109 108  CO2 12* 13*  GLUCOSE 116* 115*  BUN 81* 78*  CREATININE 4.56* 4.19*  CALCIUM 8.5 8.4    Studies/Results: Ct Head Wo Contrast  01/22/2014   CLINICAL DATA:  Altered mental status.  EXAM: CT HEAD WITHOUT CONTRAST  TECHNIQUE: Contiguous axial images were obtained from the base of the skull through the vertex without intravenous contrast.  COMPARISON:  None.  FINDINGS: No acute intracranial abnormality. Specifically, no hemorrhage, hydrocephalus, mass lesion, acute infarction, or significant intracranial injury. No acute calvarial abnormality.  Mild mucosal thickening in the paranasal sinuses. Mastoid air cells are clear.  IMPRESSION: No acute intracranial abnormality.  Mild chronic sinusitis.   Electronically Signed   By: Charlett NoseKevin  Dover M.D.   On: 01/22/2014 20:22   [place] Renal  01/22/2014   CLINICAL DATA:  Renal failure.  Diabetic.  Hypertension.  EXAM: RENAL/URINARY TRACT ULTRASOUND COMPLETE  COMPARISON:  None.  FINDINGS: Right Kidney:  Length: 12.3 cm. No hydronephrosis or mass identified. Renal parenchymal thinning  Left Kidney:   Length: 13.5 cm. No hydronephrosis or mass identified. Renal parenchymal thinning.  Bladder:  Bladder is full without mass identified.  Prevoid volume of 1072 cc.  IMPRESSION: No hydronephrosis.  Renal parenchymal thinning bilaterally.  Bladder is full with prevoid volume of 1072 cc.   Electronically Signed   By: Bridgett LarssonSteve  Olson M.D.   On: 01/22/2014 06:07   Dg Chest Port 1 View  01/22/2014   CLINICAL DATA:  Central line placement.  EXAM: PORTABLE CHEST - 1 VIEW  COMPARISON:  DG CHEST 1V PORT dated 01/22/2014  FINDINGS: Left IJ central line tip projects over the SVC.  No pneumothorax.  Mild cardiomegaly noted with pulmonary venous hypertension and interstitial accentuation favoring interstitial edema.  IMPRESSION: 1. Central line tip:  SVC.  No pneumothorax. 2. Cardiomegaly with interstitial accentuation favoring interstitial edema.   Electronically Signed   By: Herbie BaltimoreWalt  Liebkemann M.D.   On: 01/22/2014 17:16   Dg Chest Port 1 View  01/22/2014   CLINICAL DATA:  Line placement  EXAM: PORTABLE CHEST - 1 VIEW  COMPARISON:  Insert scratch pad earlier film of the same day  FINDINGS: Left IJ central line extends to the cavoatrial junction. No pneumothorax evident. Prominent perihilar interstitial markings. No effusion. Heart size upper limits normal. .  IMPRESSION: 1. Central line to cavoatrial junction without pneumothorax.   Electronically Signed   By: Oley Balmaniel  Hassell M.D.   On: 01/22/2014 13:42   Dg Chest Port 1 View  01/22/2014   CLINICAL DATA:  Altered mental status. Fever.  Diabetic. High blood pressure  EXAM: PORTABLE CHEST - 1 VIEW  COMPARISON:  05/23/2013.  FINDINGS: Cardiomegaly.  Central pulmonary vascular prominence.  No gross pneumothorax or segmental consolidation.  The patient would eventually benefit from two view chest.  IMPRESSION: Cardiomegaly.  Central pulmonary vascular prominence.   Electronically Signed   By: Bridgett Larsson M.D.   On: 01/22/2014 01:00   Anti-infectives: Anti-infectives   Start      Dose/Rate Route Frequency Ordered Stop   01/23/14 0815  vancomycin (VANCOCIN) IVPB 1000 mg/200 mL premix     1,000 mg 200 mL/hr over 60 Minutes Intravenous  Once 01/23/14 0804     01/23/14 0200  piperacillin-tazobactam (ZOSYN) IVPB 3.375 g     3.375 g 12.5 mL/hr over 240 Minutes Intravenous 3 times per day 01/22/14 1749     01/22/14 0900  vancomycin (VANCOCIN) 1,500 mg in sodium chloride 0.9 % 500 mL IVPB     1,500 mg 250 mL/hr over 120 Minutes Intravenous  Once 01/22/14 0733 01/22/14 1329   01/22/14 0800  piperacillin-tazobactam (ZOSYN) IVPB 3.375 g  Status:  Discontinued     3.375 g 12.5 mL/hr over 240 Minutes Intravenous 3 times per day 01/22/14 0733 01/22/14 1749   01/22/14 0200  vancomycin (VANCOCIN) IVPB 1000 mg/200 mL premix     1,000 mg 200 mL/hr over 60 Minutes Intravenous  Once 01/22/14 0149 01/22/14 1610      Assessment/Plan: s/p * No surgery found * Very difficult problem. Combination of pressure sore venous stasis wound and wound from chronic severe lymphedema. No evidence of arterial insufficiency. May require debridement. Would consult wound care team.   LOS: 2 days   Angel Mays 01/23/2014, 8:40 AM

## 2014-01-23 NOTE — Progress Notes (Signed)
ANTICOAGULATION CONSULT NOTE - Follow Up Consult  Pharmacy Consult for Heparin  Indication: DVT, h/o   Allergies  Allergen Reactions  . Omnipaque [Iohexol]    Patient Measurements: Height: 6\' 1"  (185.4 cm) Weight: 445 lb 15.9 oz (202.3 kg) (took of 4 liters of fluid) IBW/kg (Calculated) : 79.9  Vital Signs: Temp: 98.4 F (36.9 C) (03/07 0115) Temp src: Oral (03/06 1930) BP: 138/55 mmHg (03/07 0155) Pulse Rate: 92 (03/07 0155)  Labs:  Recent Labs  01/22/14 0005 01/22/14 0158 01/22/14 0800 01/22/14 2130 01/23/14 0110  HGB 8.5*  --  8.3*  --  7.6*  HCT 27.4*  --  26.4*  --  23.7*  PLT 203  --  200  --  225  APTT  --  54*  --   --  143*  LABPROT  --  24.1* 22.1*  --   --   INR  --  2.24* 2.00*  --   --   HEPARINUNFRC  --   --   --   --  0.50  CREATININE 4.71*  --  4.56* 4.19*  --   TROPONINI <0.30  --   --   --   --     Estimated Creatinine Clearance: 41 ml/min (by C-G formula based on Cr of 4.19).   Assessment: 43 y/o with h/o DVT, Xarelto PTA on hold, heparin restarted after line placement 3/6, HL is 0.5 and aPTT is 143, so it looks like we can start using HL to dose heparin, other labs as above.   Goal of Therapy:  Heparin level 0.3-0.7 units/ml Monitor platelets by anticoagulation protocol: Yes   Plan:  -Continue heparin at 1500 units/hr -0800 HL to confirm -Daily CBC/HL -Monitor for bleeding  Abran DukeLedford, Charls Custer 01/23/2014,2:17 AM

## 2014-01-23 NOTE — Procedures (Signed)
I was present at this dialysis session. I have reviewed the session itself and made appropriate changes.   Using L IJ nontunneled HD catheter. Goal UF 4L.  Will need to sort out disposition and if he can get to a chair for HD  Sabra Heckyan Takelia Urieta  MD 01/23/2014, 1:56 PM

## 2014-01-24 ENCOUNTER — Inpatient Hospital Stay (HOSPITAL_COMMUNITY): Payer: Medicare Other

## 2014-01-24 LAB — GLUCOSE, CAPILLARY
GLUCOSE-CAPILLARY: 147 mg/dL — AB (ref 70–99)
Glucose-Capillary: 99 mg/dL (ref 70–99)
Glucose-Capillary: 153 mg/dL — ABNORMAL HIGH (ref 70–99)

## 2014-01-24 LAB — CBC
HCT: 21.8 % — ABNORMAL LOW (ref 39.0–52.0)
HEMOGLOBIN: 7 g/dL — AB (ref 13.0–17.0)
MCH: 27.1 pg (ref 26.0–34.0)
MCHC: 32.1 g/dL (ref 30.0–36.0)
MCV: 84.5 fL (ref 78.0–100.0)
PLATELETS: 195 10*3/uL (ref 150–400)
RBC: 2.58 MIL/uL — AB (ref 4.22–5.81)
RDW: 16.9 % — ABNORMAL HIGH (ref 11.5–15.5)
WBC: 6.2 10*3/uL (ref 4.0–10.5)

## 2014-01-24 LAB — HEPARIN LEVEL (UNFRACTIONATED): HEPARIN UNFRACTIONATED: 0.87 [IU]/mL — AB (ref 0.30–0.70)

## 2014-01-24 LAB — APTT: aPTT: 48 seconds — ABNORMAL HIGH (ref 24–37)

## 2014-01-24 LAB — PREPARE RBC (CROSSMATCH)

## 2014-01-24 MED ORDER — COLLAGENASE 250 UNIT/GM EX OINT
TOPICAL_OINTMENT | Freq: Every day | CUTANEOUS | Status: DC
  Administered 2014-01-25 – 2014-01-28 (×4): via TOPICAL
  Filled 2014-01-24: qty 30

## 2014-01-24 MED ORDER — FUROSEMIDE 10 MG/ML IJ SOLN: 20.0000 mg | Freq: Once | INTRAMUSCULAR | Status: DC

## 2014-01-24 MED ORDER — VANCOMYCIN HCL IN DEXTROSE 1-5 GM/200ML-% IV SOLN
1000.0000 mg | Freq: Once | INTRAVENOUS | Status: AC
  Administered 2014-01-24: 1000 mg via INTRAVENOUS
  Filled 2014-01-24: qty 200

## 2014-01-24 MED ORDER — OXYCODONE HCL 5 MG PO TABS
5.0000 mg | ORAL_TABLET | Freq: Four times a day (QID) | ORAL | Status: DC | PRN
  Administered 2014-01-24 – 2014-01-25 (×2): 5 mg via ORAL
  Filled 2014-01-24 (×2): qty 1

## 2014-01-24 MED ORDER — ACETAMINOPHEN 325 MG PO TABS: 650.0000 mg | ORAL_TABLET | Freq: Once | ORAL | Status: DC

## 2014-01-24 NOTE — Progress Notes (Signed)
Clinical Social Work Department BRIEF PSYCHOSOCIAL ASSESSMENT 01/24/2014  Patient:  Angel Mays,Angel Mays     Account Number:  [id number]     Admit date:  01/21/2014  Clinical Social Worker:  Hadley PenRINKARD,Tonda Wiederhold, LCSWA  Date/Time:  01/24/2014 01:31 PM  Referred by:  RN  Date Referred:  01/22/2014 Referred for  SNF Placement   Other Referral:   Interview type:  Family Other interview type:   Weekend CSW spoke with patient's wife Genelle Balerri Wendling who was present at the bedside.    PSYCHOSOCIAL DATA Living Status:  FACILITY [facility] Level of care:  Skilled Nursing Facility Primary support name:  Genelle Balerri Royal [phone] Primary support relationship to patient:  SPOUSE Degree of support available:   Strong    CURRENT CONCERNS Current Concerns  Post-Acute Placement   Other Concerns:    SOCIAL WORK ASSESSMENT / PLAN Weekend CSW [facility]. Patient disoriented at time of assessment, CSW spoke with wife Camelia Engerri who was present at the bedside. Camelia Engerri states that patient has been in the hospital and rehab facilities often over the past 3 years due to medical issues. Wife lives [place] away in [place] as do their 4 adult children. Patient [facility] for approximatley 1.5 years. Wife states that she does not feel as though the facility was taking adequate care of patient and states that he will not return. Wife inquired about SNF's that have dialysis unit at facility. CSW reported that there are none locally to CSW knowledge. CSW explained SNF criteria and process of SNF search. Wife agreeable to SNF search in [place] and surrounding counties. She wants a facility that will be able to provide or transport him to his dialysis regularly. Wife became tearful when discussing her husband's medical condition, CSW offered emotional support. Wife and daughter are  staying in a hotel while patient is in hospital. CSW offered to contact [facility] on Monday to inquire about [facility] availability for family- wife agreeable. Wife thanked CSW for assistance.   Assessment/plan status:  Information/[facility]erral to [facility] Other assessment/ plan:   CSW will continue to follow for SNF placement at discharge.   Information/referral to community resources:   CSW will intiate SNF search in [place] and surrounding counties.    PATIENT'S/FAMILY'S RESPONSE TO PLAN OF CARE: Wife is tearful when discussing her husband's medical condition. She states that he has always "bounced back" before, but that his current hospitalization seems different- he continues to be disoriented. Wife wishes for patient to remain close to home but is not satisfied with quality of SNF's in the area. She states that she wants husband to be at appropriate facility and will drive hours to visit him. Wife wants facility that can accomodate his amputation, weight, and dialysis. CSW will initate SNF search [place] and surrounding counties.    Samuella BruinKristin Cassundra Mckeever, MSW, LCSWA Clinical Social Worker [facility] [phone]

## 2014-01-24 NOTE — Progress Notes (Addendum)
Note: This document was prepared with digital dictation and possible smart phrase technology. Any transcriptional errors that result from this process are unintentional.   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 His R leg has been "dark " for several months. He was seen by a wound nurse for this.  He also has wounds on his bottom which have been persistent since Jan 2015 since ICU [facility] for 2 weeks for PNA and h. flu-Went in for routine cataract surgery-Had Htn crisis and noted LVH severe then  He has not really been himself as usual  Supposed to have dialysis shunt placed as an outpatient couple 02/04/14-Dr. [name] is as primary nephrologist   Labs on admit=  PH 7.116, PCO2 32.5, PO2 103 sodium 136, BUN 83, creatinine 4.7 White count 10.7, hemoglobin 8.5, INR 2.24 UA = ketones, hyaline casts Renal ultrasound-renal parenchymal thinning Chest x-ray = cardio megaly, central pulmonary vascular prominence CT head 3/6 = no abnormality   Past medical history-As per Problem list Chart reviewed as below-   Consultants:  Critical care made aware-patient stable however  Vascular surgery  Nephrology  Wound nurse  Procedures:  3/6 Triple lumen HD catheter placed left internal jugular vein  Antibiotics:  Vancomycin 3/5  Zosyn 3/6   Subjective   Nauseated today, no appetite for breakfast Severe lower back pain on the MRI table  therefore full study not performed Concerned about need for amputation of right lower extremity Much more oriented than previously after dialysis    Objective    Interim History: none  Telemetry: Sinus   Objective: Filed Vitals:   01/23/14 1955 01/23/14 2348 01/24/14 0417 01/24/14 0700  BP: 134/56 133/40 126/47   Pulse: 87 88 89   Temp: 99.5 F (37.5 C) 98.4 F (36.9 C) 98.6 F (37 C) 99.2 F (37.3 C)  TempSrc: Oral Oral Oral Oral  Resp: _0 Height:      Weight:   210 kg (462 lb 15.5 oz)   SpO2: 100% 99% 96%     Intake/Output Summary (Last 24 hours) at 01/24/14 1314 Last data filed at 01/24/14 0800  Gross per 24 hour  Intake 1838.08 ml  Output   6075 ml  Net -4236.92 ml    Exam:  HEENT-AO X3 CHEST-clear to auscultation  CARDIAC- S1-S2 no murmur rub or gallop  ABDOMEN-obese nontender nondistended  NEURO- no asterixis, much more oriented, moving all 4 limbs equally with intact left BKA SKIN/MUSCULAR-not examined today I can appreciate popliteal pulses but because of brawny edema to right lower extremity, I'm not able to feel dorsalis pedis  Data Reviewed: Basic Metabolic Panel:  Recent Labs Lab 01/22/14 0005 01/22/14 0800 01/22/14 2130 01/23/14 0910  NA 136* 141 140 141  K 4.4 4.2 4.1 3.3*  CL 105 109 108 105  CO2 11* 12* 13* 17*  GLUCOSE 119* 116* 115* 83  BUN 83* 81* 78* 54*  CREATININE 4.71* 4.56* 4.19* 3.17*  CALCIUM 8.7 8.5 8.4 8.4  PHOS  --  6.4*  --  4.0   Liver Function Tests:  Recent Labs Lab 01/22/14 0005 01/22/14 0800 01/23/14 0910  AST 16 14  --   ALT <5 <5  --   ALKPHOS 105 105  --   BILITOT 0.3 0.3  --   PROT 7.9 8.0  --   ALBUMIN 3.2* 3.1* 2.7*   No results found for this basename: LIPASE, AMYLASE,  in the last 168 hours No results found for this basename: AMMONIA,  in the last 168 hours CBC:  Recent Labs Lab 01/22/14 0005 01/22/14 0800 01/23/14 0110 01/24/14 0350  WBC 10.7* 10.1 8.3 6.2  NEUTROABS 8.8*  8.1*  --   --   HGB 8.5* 8.3* 7.6* 7.0*  HCT 27.4* 26.4* 23.7* 21.8*  MCV 91.6 90.4 86.8 84.5  PLT 203 200 225 195   Cardiac Enzymes:  Recent Labs Lab 01/22/14 0005  TROPONINI <0.30   BNP: No components found with this basename: POCBNP,  CBG:  Recent Labs Lab 01/23/14 0823 01/23/14 1150 01/23/14 1727 01/23/14 2139 01/24/14 0805  GLUCAP 85 109* 102* 112* 99    Recent Results (from the past 240 hour(s))  CULTURE, BLOOD (ROUTINE X 2)     Status: None   Collection Time    01/21/14 11:42 PM      Result Value Ref Range Status   Specimen Description BLOOD RIGHT ARM   Final   Special Requests BOTTLES DRAWN AEROBIC AND ANAEROBIC 10CC EACH   Final   Culture  Setup Time     Final   Value: 01/22/2014 05:19     Performed at [facility]   Culture     Final   Value:        BLOOD CULTURE RECEIVED NO GROWTH TO DATE CULTURE WILL BE HELD FOR 5 DAYS BEFORE ISSUING A FINAL NEGATIVE REPORT     Performed at [facility]   Report Status PENDING   Incomplete  CULTURE, BLOOD (ROUTINE X 2)     Status: None   Collection Time    01/22/14 12:05 AM      Result Value Ref Range Status   Specimen Description BLOOD LEFT ARM   Final   Special Requests BOTTLES DRAWN AEROBIC ONLY 10CC   Final   Culture  Setup Time     Final   Value: 01/22/2014 05:19     Performed at [facility]   Culture     Final   Value:        BLOOD CULTURE RECEIVED NO GROWTH TO DATE CULTURE WILL BE HELD FOR 5 DAYS BEFORE ISSUING A FINAL NEGATIVE REPORT     Performed at [facility]   Report Status PENDING   Incomplete  MRSA PCR SCREENING     Status: None   Collection Time    01/22/14  7:22 AM      Result Value Ref Range Status   MRSA by PCR NEGATIVE  NEGATIVE Final   Comment:            The GeneXpert MRSA Assay (FDA     approved for NASAL specimens     only), is one component of a     comprehensive MRSA colonization     surveillance program. It is not     intended to diagnose MRSA      infection nor to guide or     monitor treatment for     MRSA infections.  Studies:              All Imaging reviewed and is as per above notation   Scheduled Meds: . allopurinol  100 mg Oral Daily  . amLODipine  10 mg Oral Daily  . apixaban  5 mg Oral BID  . b complex-vitamin c-folic acid  1 tablet Oral QHS  . carvedilol  25 mg Oral BID WC  . cholecalciferol  5,000 Units Oral Once per day on Mon Tue Wed Thu Fri  . cloNIDine  0.2 mg Oral BID  . ferrous gluconate  324 mg Oral BID WC  . fluticasone  2 spray Each Nare Daily  . heparin  3,000 Units Intracatheter Once  . hydrALAZINE  100 mg Oral TID  . insulin aspart  0-9 Units Subcutaneous TID WC  . loratadine  10 mg Oral Daily  . pantoprazole  40 mg Oral Daily  . pentoxifylline  400 mg Oral TID WC  . piperacillin-tazobactam (ZOSYN)  IV  3.375 g Intravenous 3 times per day  . prednisoLONE acetate  1 drop Left Eye QID  . sevelamer carbonate  800 mg Oral TID WC  . sodium chloride  3 mL Intravenous Q12H   Continuous Infusions: . sodium chloride 0.45 % 1,000 mL with sodium bicarbonate 75 mEq infusion 75 mL/hr at 01/23/14 1143     Assessment/Plan: 1. Acute toxic metabolic encephalopathy-secondary to uremia-resolved 2. Acute metabolic acidosis, multifactorial, DDX = sepsis ? Impending uremia-Unlikely Salicyclate tox-Continue broad-spectrum Abx. First dialysis = 3/7-much more oriented now-bicarbonate 17 3. ESRD -see above-Tunneled HD cath placed 3/6-defer to nephrology regarding continued dialysis need 4. Probable diabetic/Lymphedema wound right side --patient may eventually end up with another amputation unfortunately -vascular discussing options with the patient and family.  MRI nonspecific, nonsensitive-defer to clinical judgment in a.m. on review 5. Lymphedema -multifactorial , potentially secondary to post DVT swelling history  6. DVT , status post filter placement -since no imminent surgical procedure  Xarelto-->apixaban.  D/c Iv  Heparin gtt.  7. Diabetes mellitus-blood sugars -92-153 graduate to diabetic diet if patient oriented enough to eat . 4 times a day a.c. at bedtime coverage ordered as more alert 8. Hypertension-continue amlodipine 10 daily, Coreg 25 twice a day, clonidine 0.2 twice a day, hydralazine 100 3 times a day.   9. Metabolic bone disease -defer to nephrology 10. 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 defer ESA->Renal-TIBC 149.  We'll transfuse 1 unit record blood cells in anticipation of surgery 11. Super Morbid obesity, Body mass index is 59.03 kg/(m^2).-life threatening 104. Debility-Patient bed bound at baseline-[place] decision making re: dialysis challenging.  Social worker/family have discussed that options and placement at a skilled nursing facility that might have dialysis capabilities-we still need to determine what to do with his right lower extremity prior to making these decisions   Code Status: FUll Family Communication: Wife fully updated by telephone Disposition Plan: SDU--> telemetry.  He will need to be clipped if he is deemed end-stage renal and I will transfer him to 6700.  He needs one unit packed red blood cells and may benefit from right lower extremity amputation if it looks no better in the morning. He is currently on Apixaban which may have to be switched back to heparin per vascular surgery.     Verneita Griffes, MD  [facility] Pager [phone] 01/24/2014, 1:14 PM    LOS: 3 days

## 2014-01-24 NOTE — Progress Notes (Signed)
Clinical Social Work Department CLINICAL SOCIAL WORK PLACEMENT NOTE 01/24/2014  Patient:  Angel Mays,Angel Mays  Account Number:  [id number] Admit date:  01/21/2014  Clinical Social Worker:  Angel Mays, Angel Mays  Date/time:  01/24/2014 03:49 PM  Clinical Social Work is seeking post-discharge placement for this patient at the following level of care:   SKILLED NURSING   (*CSW will update this form in Epic as items are completed)     Patient/family provided with [facility] Department of Clinical Social Work's list of facilities offering this level of care within the geographic area requested by the patient (or if unable, by the patient's family).  01/24/2014  Patient/family informed of their freedom to choose among providers that offer the needed level of care, that participate in Medicare, Medicaid or managed care program needed by the patient, have an available bed and are willing to accept the patient.    Patient/family informed of [facility]' ownership interest in [facility], as well as of the fact that they are under no obligation to receive care at this facility.  PASARR submitted to EDS on 12/19/2011 PASARR number received from EDS on 12/19/2011  FL2 transmitted to all facilities in geographic area requested by pt/family on  01/24/2014 FL2 transmitted to all facilities within larger geographic area on 01/24/2014  Patient informed that his/her managed care company has contracts with or will negotiate with  certain facilities, including the following:     Patient/family informed of bed offers received:   Patient chooses bed at  Physician recommends and patient chooses bed at    Patient to be transferred to  on   Patient to be transferred to facility by   The following physician request were entered in Epic:   Additional Comments: 01/24/14: Faxed out to [place], [place], [place], [place], [place], [place], [place], [place] per wife's  request.   Angel Mays, MSW, LCSWA Clinical Social Worker [facility]. [phone]

## 2014-01-24 NOTE — Progress Notes (Signed)
Report called pt transferring to [id number] via bed with belongings. Will notify wife of new room number.

## 2014-01-24 NOTE — Consult Note (Signed)
[facility], however with VVS consult scheduled did not recommend any wound care for the RLE.  He has 3 areas on the RLE laterally they are mostly necrotic, the largest 11cm x 4cm x 0.2cm is 100% yellow/grey slough; distal wounds are smaller with 50% yellow necrotic tissue present in each.  Pt has palpable pulse and hx of venous stasis and probable lymphedema. He is not ambulatory and has LE amputation of the left.   VVS is not recommending any further vascular interventions.  Will need debridement to have any chances of healing if at all.  Will start Santyl for enzymatic debridement and hydrotherapy daily. If any exposed tendons or otherwise when hydrotherapy started will need to get orthopedics involved.  Once wounds clean will re-eval for moist wound healing and compression therapy.   WOC will follow along with you for wound care recommendations.  Amanie Mcculley [place] RN, [place] [phone]

## 2014-01-24 NOTE — Discharge Instructions (Signed)

## 2014-01-24 NOTE — Progress Notes (Signed)
ANTICOAGULATION ANTIBIOTIC - Follow Up Consult  Pharmacy Consult for Heparin  Indication: DVT, h/o   Allergies  Allergen Reactions  . Omnipaque [Iohexol]    Patient Measurements: Height: 6\' 1"  (185.4 cm) Weight: 462 lb 15.5 oz (210 kg) IBW/kg (Calculated) : 79.9  Vital Signs: Temp: 99.2 F (37.3 C) (03/08 0700) Temp src: Oral (03/08 0700) BP: 126/47 mmHg (03/08 0417) Pulse Rate: 89 (03/08 0417)  Labs:  Recent Labs  01/22/14 0005 01/22/14 0158 01/22/14 0800 01/22/14 2130 01/23/14 0110 01/23/14 0910 01/24/14 0350  HGB 8.5*  --  8.3*  --  7.6*  --  7.0*  HCT 27.4*  --  26.4*  --  23.7*  --  21.8*  PLT 203  --  200  --  225  --  195  APTT  --  54*  --   --  143*  --  48*  LABPROT  --  24.1* 22.1*  --   --   --   --   INR  --  2.24* 2.00*  --   --   --   --   HEPARINUNFRC  --   --   --   --  0.50 0.16* 0.87*  CREATININE 4.71*  --  4.56* 4.19*  --  3.17*  --   TROPONINI <0.30  --   --   --   --   --   --     Estimated Creatinine Clearance: 55.5 ml/min (by C-G formula based on Cr of 3.17).   Assessment: 43 y/o with h/o DVT, Xarelto PTA admitted 01/21/2014 with confusion and fever. Pharmacy consulted to dose heparin> transitioned to apixaban 3/7  PMH: CKD, gout, DM, HTN, DVT  Anticoagulation: H/o DVT, heparin infusing, no bleeding noted, h/h trend down [facility] (last xarelto dose was 3/4 at 9pm per [facility]) > now on HD transitioned to apixaban 3/8  Infectious Disease: RLE cellulitis. WBC= trend down afeb.   3/6 zosyn 3/6 vanc (1500 mg + 1000mg  load given 3/6)  3/6 blood x2 ngtd  Cardiovascular: HTN -clonidone, norvasc, hydral, trental, coreg SBP 110s-140s, HR 90-100s  Endocrinology: DM, gout allopurinol Glucose < 150s  Gastrointestinal / Nutrition: PPI Neurology: Acute encephalopathy most likely secondary to uremia   Nephrology: Severe metabolic acidosis. Stage 5 CKD, HD started 3/6 pm, HD 3/7  Hematology / Oncology: iron.  Hg trend down TSat 52%  PTA Medication Issues: Home medications not ordered: lantus, trazodone, oxycodone cr  Goal of Therapy:  Vancomycin Trough 15-20 mcg/ml  Plan:  1. Vancomycin 1g today. 2. Follow up plan for HD + Abx before implementing standing post HD regimen  Thank you for allowing pharmacy to be a part of this patients care team.  Lovenia KimJulie Ariella Voit Pharm.D., BCPS Clinical Pharmacist 01/24/2014 8:21 AM Pager: [phone] Phone: [phone]

## 2014-01-25 LAB — CBC
HCT: 25.1 % — ABNORMAL LOW (ref 39.0–52.0)
HEMOGLOBIN: 8.2 g/dL — AB (ref 13.0–17.0)
MCH: 27.2 pg (ref 26.0–34.0)
MCHC: 32.7 g/dL (ref 30.0–36.0)
MCV: 83.1 fL (ref 78.0–100.0)
Platelets: 186 10*3/uL (ref 150–400)
RBC: 3.02 MIL/uL — AB (ref 4.22–5.81)
RDW: 16.1 % — ABNORMAL HIGH (ref 11.5–15.5)
WBC: 8 10*3/uL (ref 4.0–10.5)

## 2014-01-25 LAB — COMPREHENSIVE METABOLIC PANEL
ALK PHOS: 81 U/L (ref 39–117)
ALT: 6 U/L (ref 0–53)
AST: 14 U/L (ref 0–37)
Albumin: 2.7 g/dL — ABNORMAL LOW (ref 3.5–5.2)
BUN: 33 mg/dL — ABNORMAL HIGH (ref 6–23)
CHLORIDE: 105 meq/L (ref 96–112)
CO2: 20 meq/L (ref 19–32)
Calcium: 8.5 mg/dL (ref 8.4–10.5)
Creatinine, Ser: 2.41 mg/dL — ABNORMAL HIGH (ref 0.50–1.35)
GFR calc non Af Amer: 31 mL/min — ABNORMAL LOW (ref >90)
GFR, EST AFRICAN AMERICAN: 36 mL/min — AB (ref >90)
GLUCOSE: 155 mg/dL — AB (ref 70–99)
POTASSIUM: 3.1 meq/L — AB (ref 3.7–5.3)
SODIUM: 143 meq/L (ref 137–147)
TOTAL PROTEIN: 7.2 g/dL (ref 6.0–8.3)
Total Bilirubin: 0.3 mg/dL (ref 0.3–1.2)

## 2014-01-25 LAB — GLUCOSE, CAPILLARY
GLUCOSE-CAPILLARY: 136 mg/dL — AB (ref 70–99)
GLUCOSE-CAPILLARY: 152 mg/dL — AB (ref 70–99)
Glucose-Capillary: 139 mg/dL — ABNORMAL HIGH (ref 70–99)
Glucose-Capillary: 140 mg/dL — ABNORMAL HIGH (ref 70–99)

## 2014-01-25 LAB — MAGNESIUM: Magnesium: 1.7 mg/dL (ref 1.5–2.5)

## 2014-01-25 LAB — PHOSPHORUS: Phosphorus: 2.4 mg/dL (ref 2.3–4.6)

## 2014-01-25 MED ORDER — PRO-STAT SUGAR FREE PO LIQD
30.0000 mL | Freq: Two times a day (BID) | ORAL | Status: DC
  Administered 2014-01-28: 30 mL via ORAL
  Filled 2014-01-25 (×7): qty 30

## 2014-01-25 MED ORDER — OXYCODONE HCL 5 MG PO TABS
10.0000 mg | ORAL_TABLET | Freq: Four times a day (QID) | ORAL | Status: DC | PRN
  Administered 2014-01-25 – 2014-01-28 (×5): 10 mg via ORAL
  Filled 2014-01-25 (×5): qty 2

## 2014-01-25 MED ORDER — DARBEPOETIN ALFA-POLYSORBATE 100 MCG/0.5ML IJ SOLN
100.0000 ug | INTRAMUSCULAR | Status: DC
  Administered 2014-01-26: 100 ug via INTRAVENOUS
  Filled 2014-01-25: qty 0.5

## 2014-01-25 MED ORDER — PIPERACILLIN-TAZOBACTAM IN DEX 2-0.25 GM/50ML IV SOLN
2.2500 g | Freq: Three times a day (TID) | INTRAVENOUS | Status: DC
  Administered 2014-01-25 – 2014-01-26 (×3): 2.25 g via INTRAVENOUS
  Filled 2014-01-25 (×7): qty 50

## 2014-01-25 MED ORDER — NEPRO/CARBSTEADY PO LIQD
237.0000 mL | Freq: Three times a day (TID) | ORAL | Status: DC
  Administered 2014-01-25 – 2014-01-28 (×6): 237 mL via ORAL

## 2014-01-25 NOTE — Progress Notes (Signed)
Physical Therapy Wound Evaluation and Treatment Patient Details  Name: Angel Mays MRN: [id number] Date of Birth: 1971/06/30  Today's Date: 01/25/2014 Time: 4650-3546 Time Calculation (min): 48 min  Subjective  Subjective: Reports he thinks wound started due to injury from [place] of wheelchair Patient and Family Stated Goals: get leg better Date of Onset:  (unknown (~3 weeks PTA per pt)) Prior Treatments: unknown  Pain Score: 6/10 Rt lower leg pre-hydro; 7/10 after hydro  Wound Assessment  Wound / Incision (Open or Dehisced) 01/22/14 Other (Comment) Leg Right;Proximal;Lateral;Lower (Active)  Dressing Type Santyl, saline 4x4, kerlix 01/25/2014 12:00 PM  Dressing Changed Changed 01/25/2014  9:45 AM  Dressing Status Clean;Intact 01/25/2014 12:00 PM  Dressing Change Frequency Daily 01/25/2014  9:45 AM  Site / Wound Assessment Brown;Dusky;Granulation tissue;Painful;Pale;Pink;Yellow 01/25/2014  9:45 AM  % Wound base Red or Granulating 5% 01/25/2014  9:45 AM  % Wound base Yellow 15% 01/25/2014  9:45 AM  % Wound base Black 80% 01/25/2014  9:45 AM  Peri-wound Assessment Intact;Edema 01/25/2014 12:00 PM  Wound Length (cm) 10.8 cm 01/25/2014  9:45 AM  Wound Width (cm) 5 cm 01/25/2014  9:45 AM  Wound Depth (cm) 0.3 cm 01/25/2014  9:45 AM  Margins Unattached edges (unapproximated) 01/25/2014  9:45 AM  Closure None 01/23/2014  7:00 PM  Drainage Amount Moderate 01/25/2014 12:00 PM  Drainage Description Odor 01/25/2014 12:00 PM  Treatment Debridement (Selective);Hydrotherapy (Ultrasonic mist) 01/25/2014  9:45 AM     Wound / Incision (Open or Dehisced) 01/25/14 Rt lateral lower leg middle wound (Active)  Dressing Type Gauze (Comment);Moist to dry, Santyl 01/25/2014  9:45 AM  Dressing Changed Changed 01/25/2014  9:45 AM  Dressing Status Clean;Dry;Intact 01/25/2014  9:45 AM  Dressing Change Frequency Daily 01/25/2014  9:45 AM  Site / Wound Assessment Brown;Pale;Pink;Yellow 01/25/2014  9:45 AM  % Wound base Red or Granulating 40% 01/25/2014   9:45 AM  % Wound base Yellow 30% 01/25/2014  9:45 AM  % Wound base Black 30% 01/25/2014  9:45 AM  Peri-wound Assessment Intact;Edema 01/25/2014  9:45 AM  Wound Length (cm) 4.6 cm 01/25/2014  9:45 AM  Wound Width (cm) 4.4 cm 01/25/2014  9:45 AM  Wound Depth (cm) 0.4 cm 01/25/2014  9:45 AM  Margins Unattached edges (unapproximated) 01/25/2014  9:45 AM  Drainage Amount Moderate 01/25/2014  9:45 AM  Drainage Description Serosanguineous;Odor 01/25/2014  9:45 AM  Treatment Debridement (Selective);Hydrotherapy (Ultrasonic mist) 01/25/2014  9:45 AM     Wound / Incision (Open or Dehisced) 01/25/14 Rt lateral lower leg distal (Active)  Dressing Type Gauze (Comment);Moist to dry; Santyl 01/25/2014  9:45 AM  Dressing Changed Changed 01/25/2014  9:45 AM  Dressing Status Clean;Dry;Intact 01/25/2014  9:45 AM  Dressing Change Frequency Daily 01/25/2014  9:45 AM  Site / Wound Assessment Pink;Yellow 01/25/2014  9:45 AM  % Wound base Red or Granulating 90% 01/25/2014  9:45 AM  % Wound base Yellow 10% 01/25/2014  9:45 AM  % Wound base Black 0% 01/25/2014  9:45 AM  % Wound base Other (Comment) 0% 01/25/2014  9:45 AM  Peri-wound Assessment Intact;Edema 01/25/2014  9:45 AM  Wound Length (cm) 3.4 cm 01/25/2014  9:45 AM  Wound Width (cm) 3.7 cm 01/25/2014  9:45 AM  Wound Depth (cm) 0.1 cm 01/25/2014  9:45 AM  Margins Unattached edges (unapproximated) 01/25/2014  9:45 AM  Drainage Amount Scant 01/25/2014  9:45 AM  Drainage Description Serous 01/25/2014  9:45 AM  Treatment Debridement (Selective);Hydrotherapy (Ultrasonic mist) 01/25/2014  9:45 AM  Hydrotherapy Ultrasonic mist  - wound location: Rt lateral lower leg, 3 wounds Ultrasonic mist at 35KHz (+/-3KHz) at ___ percent: 100 % Ultrasonic mist therapy minutes: 8 min Selective Debridement Selective Debridement - Location: Rt lateral lower leg Selective Debridement - Tools Used: Forceps;Scalpel;Scissors Selective Debridement - Tissue Removed: yellow, brown, black eschar   Wound Assessment and Plan   Wound Therapy - Assess/Plan/Recommendations Wound Therapy - Clinical Statement: Pt admitted with several week history of worsening wounds to Rt lower leg. Pt with wound infection and significant amount of necrotic tissue that is delaying healing and promoting continued infection. Pt will benefit from continued hydrotherapy to reduce necrotic tissue and improve healing.  Wound Therapy - Functional Problem List: integumentary defect puts pt at continued incr risk of infection Factors Delaying/Impairing Wound Healing: Altered sensation;Diabetes Mellitus;Infection - systemic/local;Immobility;Multiple medical problems Hydrotherapy Plan: Debridement;Dressing change;Patient/family education;Pulsatile lavage with suction;Ultrasonic wound therapy _0  KHz (+/- 3 KHz) (may switch to PLS if ultrasonic becomes too painful) Wound Therapy - Frequency: 6X / week Wound Therapy - Current Recommendations: Case manager/social work Wound Therapy - Follow Up Recommendations: Skilled nursing facility Wound Plan: see above  Wound Therapy Goals- Improve the function of patient's integumentary system by progressing the wound(s) through the phases of wound healing (inflammation - proliferation - remodeling) by: Decrease Necrotic Tissue to: 75 (proximal wound) Decrease Necrotic Tissue - Progress: Goal set today Increase Granulation Tissue to: 25 (proximal wound) Increase Granulation Tissue - Progress: Goal set today Improve Drainage Characteristics: Min;Serous Improve Drainage Characteristics - Progress: Goal set today Goals/treatment plan/discharge plan were made with and agreed upon by patient/family: Yes Time For Goal Achievement: 7 days Wound Therapy - Potential for Goals: Good  Goals will be updated until maximal potential achieved or discharge criteria met.  Discharge criteria: when goals achieved, discharge from hospital, MD decision/surgical intervention, no progress towards goals, refusal/missing three  consecutive treatments without notification or medical reason.  GP     Rayleigh Gillyard 01/25/2014, 12:41 PM Pager [phone]

## 2014-01-25 NOTE — Progress Notes (Addendum)
Note: This document was prepared with digital dictation and possible smart phrase technology. Any transcriptional errors that result from this process are unintentional.   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 [reconstructive surgery]-->Transferred to [place] [facility] since September 2013, [place], Known Diabetic foot ulcers R side followed by wound care, admitted early 3.6.16 with increasing confusion in setting of hypoglycemia, and foul odour from RLE wound Wound first noticed ~ February mid 2015. His R leg has been "dark " for several months. He was seen by a wound nurse for this.  He also has wounds on his bottom which have been persistent since Jan 2015 since ICU [facility] for 2 weeks for PNA and h. flu-Went in for routine cataract surgery-Had Htn crisis and noted LVH severe then  He has not really been himself as usual  Supposed to have dialysis shunt placed as an outpatient couple 02/04/14-Dr. [name] is as primary nephrologist   Labs on admit=  PH 7.116, PCO2 32.5, PO2 103 sodium 136, BUN 83, creatinine 4.7 White count 10.7, hemoglobin 8.5, INR 2.24 UA = ketones, hyaline casts Renal ultrasound-renal parenchymal thinning Chest x-ray = cardio megaly, central pulmonary vascular prominence CT head 3/6 = no abnormality   Past medical history-As per Problem list Chart reviewed as below-   Consultants:  Critical care made aware-patient stable however  Vascular surgery  Nephrology  Wound nurse  Procedures:  3/6 Triple lumen HD catheter placed left internal jugular vein  MRI 3/8  Antibiotics:  Vancomycin 3/5  Zosyn 3/6   Subjective   Nauseated today, no appetite for breakfast Severe lower back pain on the MRI  table therefore full study not performed Concerned about need for amputation of right lower extremity Much more oriented than previously after dialysis    Objective    Interim History: none  Telemetry: Sinus   Objective: Filed Vitals:   01/24/14 1938 01/25/14 0516 01/25/14 0742 01/25/14 1134  BP: 141/64 129/68 150/70 171/71  Pulse: 87 87 89 78  Temp: 99.2 F (37.3 C) 98.4 F (36.9 C) 98.1 F (36.7 C) 99.6 F (37.6 C)  TempSrc: Oral Oral Oral Oral  Resp: 16 18 18 18   Height: 6' 1"  (1.854 m)     Weight: 188 kg (414 lb 7.4 oz)     SpO2: 100% 97% 100% 98%    Intake/Output Summary (Last 24 hours) at 01/25/14 1446 Last data filed at 01/25/14 1135  Gross per 24 hour  Intake   1495 ml  Output   2275 ml  Net   -780 ml    Exam:  HEENT-AO X3 CHEST-clear to auscultation  CARDIAC- S1-S2 no murmur rub or gallop  ABDOMEN-obese nontender nondistended  NEURO- no asterixis, much more oriented, moving all 4 limbs equally with intact left BKA SKIN/MUSCULAR-not examined today I can appreciate popliteal pulses but because of brawny edema to right lower extremity, I'm not able to feel dorsalis pedis  Data Reviewed: Basic Metabolic Panel:  Recent Labs Lab 01/22/14 0005 01/22/14 0800 01/22/14 2130 01/23/14 0910  NA 136* 141 140 141  K 4.4 4.2 4.1 3.3*  CL 105 109 108 105  CO2 11* 12* 13* 17*  GLUCOSE 119* 116* 115* 83  BUN 83* 81* 78* 54*  CREATININE 4.71*  4.56* 4.19* 3.17*  CALCIUM 8.7 8.5 8.4 8.4  PHOS  --  6.4*  --  4.0   Liver Function Tests:  Recent Labs Lab 01/22/14 0005 01/22/14 0800 01/23/14 0910  AST 16 14  --   ALT <5 <5  --   ALKPHOS 105 105  --   BILITOT 0.3 0.3  --   PROT 7.9 8.0  --   ALBUMIN 3.2* 3.1* 2.7*   No results found for this basename: LIPASE, AMYLASE,  in the last 168 hours No results found for this basename: AMMONIA,  in the last 168 hours CBC:  Recent Labs Lab 01/22/14 0005 01/22/14 0800 01/23/14 0110 01/24/14 0350  WBC  10.7* 10.1 8.3 6.2  NEUTROABS 8.8* 8.1*  --   --   HGB 8.5* 8.3* 7.6* 7.0*  HCT 27.4* 26.4* 23.7* 21.8*  MCV 91.6 90.4 86.8 84.5  PLT 203 200 225 195   Cardiac Enzymes:  Recent Labs Lab 01/22/14 0005  TROPONINI <0.30   BNP: No components found with this basename: POCBNP,  CBG:  Recent Labs Lab 01/24/14 0805 01/24/14 1418 01/24/14 1731 01/25/14 0741 01/25/14 1137  GLUCAP 99 153* 147* 136* 139*    Recent Results (from the past 240 hour(s))  CULTURE, BLOOD (ROUTINE X 2)     Status: None   Collection Time    01/21/14 11:42 PM      Result Value Ref Range Status   Specimen Description BLOOD RIGHT ARM   Final   Special Requests BOTTLES DRAWN AEROBIC AND ANAEROBIC 10CC EACH   Final   Culture  Setup Time     Final   Value: 01/22/2014 05:19     Performed at [facility]   Culture     Final   Value:        BLOOD CULTURE RECEIVED NO GROWTH TO DATE CULTURE WILL BE HELD FOR 5 DAYS BEFORE ISSUING A FINAL NEGATIVE REPORT     Performed at [facility]   Report Status PENDING   Incomplete  CULTURE, BLOOD (ROUTINE X 2)     Status: None   Collection Time    01/22/14 12:05 AM      Result Value Ref Range Status   Specimen Description BLOOD LEFT ARM   Final   Special Requests BOTTLES DRAWN AEROBIC ONLY 10CC   Final   Culture  Setup Time     Final   Value: 01/22/2014 05:19     Performed at [facility]   Culture     Final   Value:        BLOOD CULTURE RECEIVED NO GROWTH TO DATE CULTURE WILL BE HELD FOR 5 DAYS BEFORE ISSUING A FINAL NEGATIVE REPORT     Performed at [facility]   Report Status PENDING   Incomplete  MRSA PCR SCREENING     Status: None   Collection Time    01/22/14  7:22 AM      Result Value Ref Range Status   MRSA by PCR NEGATIVE  NEGATIVE Final   Comment:            The GeneXpert MRSA Assay (FDA     approved for NASAL specimens     only), is one component of a     comprehensive MRSA colonization     surveillance program. It is  not     intended to diagnose MRSA     infection nor to guide or     monitor treatment  for     MRSA infections.     Studies:              All Imaging reviewed and is as per above notation   Scheduled Meds: . acetaminophen  650 mg Oral Once  . allopurinol  100 mg Oral Daily  . amLODipine  10 mg Oral Daily  . apixaban  5 mg Oral BID  . b complex-vitamin c-folic acid  1 tablet Oral QHS  . carvedilol  25 mg Oral BID WC  . cholecalciferol  5,000 Units Oral Once per day on Mon Tue Wed Thu Fri  . cloNIDine  0.2 mg Oral BID  . collagenase   Topical Daily  . [START ON 01/26/2014] darbepoetin (ARANESP) injection - DIALYSIS  100 mcg Intravenous Q Tue-HD  . fluticasone  2 spray Each Nare Daily  . heparin  3,000 Units Intracatheter Once  . hydrALAZINE  100 mg Oral TID  . insulin aspart  0-9 Units Subcutaneous TID WC  . loratadine  10 mg Oral Daily  . pantoprazole  40 mg Oral Daily  . pentoxifylline  400 mg Oral TID WC  . piperacillin-tazobactam (ZOSYN)  IV  2.25 g Intravenous 3 times per day  . prednisoLONE acetate  1 drop Left Eye QID  . sevelamer carbonate  800 mg Oral TID WC  . sodium chloride  3 mL Intravenous Q12H   Continuous Infusions:     Assessment/Plan: 1. Acute toxic metabolic encephalopathy-secondary to uremia-resolved 2. Acute metabolic acidosis, multifactorial, DDX = sepsis ? Impending uremia-Unlikely Salicyclate tox-Continue broad-spectrum Abx. First dialysis = 3/7-much more oriented now-labs c dialysis am. 3. ESRD -see above-Tunneled HD cath placed 3/6-defer to nephrology regarding continued dialysis need 4. Probable diabetic/Lymphedema wound right side --patient may eventually end up with another amputation unfortunately -vascular states pulses notable so not ischemia-Plastic surgery consulted 3/9.  MRI nonspecific, nonsensitive as patient couldn't complete study 3/8 5. Lymphedema -multifactorial , potentially secondary to post DVT swelling history   6. DVT , status post  filter placement -since no imminent surgical procedure  Xarelto-->apixaban.  D/c Iv Heparin gtt.  7. Diabetes mellitus-blood sugars -138-136. Not eating.  ? Uremic symptoms 8. Hypertension-continue amlodipine 10 daily, Coreg 25 twice a day, clonidine 0.2 twice a day, hydralazine 100 3 times a day.   9. Metabolic bone disease -defer to nephrology 10. 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 defer ESA->Renal-TIBC 149.  Transfuse in am if hemoglobin below 7.   11. Super Morbid obesity, Body mass index is 59.03 kg/(m^2).-life threatening 40. Debility-Patient bed bound at baseline-[place] decision making re: dialysis challenging.  Social worker/family have discussed that options and placement at a skilled nursing facility that might have dialysis capabilities-we still need to determine what to do with his right lower extremity prior to making these decisions    Code Status: FUll Family Communication: none present.  Disposition Plan: SDU--> telemetry.  He will need to be clipped if he is deemed end-stage renal and I will transfer him to 6700.     Verneita Griffes, MD  [facility] Pager [phone] 01/25/2014, 2:46 PM    LOS: 4 days

## 2014-01-25 NOTE — Consult Note (Signed)
Reason for Consult: RLE wounds Referring Physician: Dr. Mahala Menghini Location: [facility] Inpatient Date 01/25/2014  Angel Mays is an 43 y.o. male.  HPI: Admitted 3 days ago with encephalopathy related to uremia and RLE cellulitis with ulcers. Patient has been maintained on Vanc/Zosyn and received dialysis yesterday. Neurologically improved. Seen by Vascular surgery for RLE wounds secondary to venous stasis, lymphedema. No surgical intervention recommended; ABI has been ordered. Current wound care is Santyl and hydrotherapy instituted today. Pt was in Rehab facility in [place] and states did not have wound on admission there. Hx significant for LLE amputation, per chart related to chronic venous wounds. Patient has been non ambulatory for several years. Also chart with plan for placement of fistula or dialysis catheter as OP prior to this urgent admission and need for HD.  MRI without osteo or deep abscess. Has had lymphedema wraps in past; unclear why this was discontinued. Currently on Eliquis-received dose this am. Hx also significant for recent cataract surgery and required admission for HTN and subsequent ICU stay for ARDS, pneumonia.  Past Medical History  Diagnosis Date  . Anemia   . Diabetes mellitus without complication   . GERD (gastroesophageal reflux disease)   . Hypertension   . Venous stasis ulcers   . Morbid obesity   . Renal disorder     Past Surgical History  Procedure Laterality Date  . Below knee leg amputation Left     Family History  Problem Relation Age of Onset  . Diabetes Mother   . Hyperlipidemia Mother   . Hypertension Mother     Social History:  reports that he has never smoked. He has never used smokeless tobacco. He reports that he does not drink alcohol or use illicit drugs.  Allergies:  Allergies  Allergen Reactions  . Omnipaque [Iohexol]     Medications: I have reviewed the patient's current medications.   Angel Mays Wo Contrast  01/24/2014    CLINICAL DATA:  Osteomyelitis.  EXAM: MRI OF LOWER Mays EXTREMITY WITHOUT CONTRAST  TECHNIQUE: Multiplanar, multisequence Angel imaging was performed. No intravenous contrast was administered.  COMPARISON:  DG KNEE 1-2V*R* dated 01/12/2013  FINDINGS: After 3 coronal sequences extending from the proximal tibial diaphysis through the ankle joint, the patient refused further imaging.  No significant abnormal osseous edema signal is noted in the visualized portion of the tibia and fibula on the T2 fat saturated images. There is muscular atrophy throughout the calf along with of muscular and mild subcutaneous edema. No compelling findings of gas within the soft tissues.  IMPRESSION: 1. Muscular and subcutaneous edema in the calf -myositis is not excluded. No obvious abscess. No findings of osteomyelitis. 2. The patient terminated the exam after we obtained 3 coronal sequences. Accordingly the full exam could not be obtained. This reduces diagnostic sensitivity and specificity.   Electronically Signed   By: Herbie Baltimore M.D.   On: 01/24/2014 14:43    HbA1c 5.8 Hb 7.0 INR on admission 2.4   ROS Blood pressure 150/70, pulse 89, temperature 98.1 F (36.7 C), temperature source Oral, resp. rate 18, height 6\' 1"  (1.854 m), weight 188 kg (414 lb 7.4 oz), SpO2 100.00%. Physical Exam Alert oriented Obese RLE with induration consistent with chronic stasis and lymphedema Unable to palpate DP (+ doppler on vascular exam) R lateral leg with cluster of two ulcers, eschar has lifted since admission and able to note dermal bleeding areas post hydrotherapy No appreciable cellulitis this am  Assessment/Plan: RLE ulcers in  setting of lymphedema, venous stasis, morbid obesity, and DM.  Wound examined today during hydrotherapy session and able to clean wound well and evidence of bleeding tissue beneath. Given his significant comorbidities and recent ICU stay post cataract surgery, plan to continue with current  hydrotherapy and Santyl. Spoke with therapists that can page me if they feel wound not progressing, or for exam with next treatment. If he would need surgical debridement, would need to be off Eliquis for 24 hours.   Moving forward would benefit from facility that can offer lymphedema wraps and /or lymphedema pump in addition to wound care. Would hold on any layered compression until ABI can be documented.   Glenna FellowsBrinda Margrit Minner, MD [facility] Plastic & Reconstructive Surgery [phone]

## 2014-01-25 NOTE — Progress Notes (Signed)
Late Entry   Transfer Note  Arrival Method: Bed Mental Orientation: AOX4 Telemetry: placed Assessment: Completed Skin: skin tear to right back with foam placed, fissure With sacral foam dressing buttock blanchable, Maceration to posterior right thigh with foam dressing, maceration to scrotum with barrier cream, DTI to posterior right thigh with foam dressing, wound to right leg with dressing, dry  Cracking areas to heels, left bka, and right foot.  ZO:XWRUEAV:placed with in date, NSL Pain: denies Tubes: na 6 [place] Orientation: Patient has been orientated to the room, unit and staff.    Milinda Caveenise C Tani Virgo, BSN, RN-BC [phone]

## 2014-01-25 NOTE — Progress Notes (Addendum)
INITIAL NUTRITION ASSESSMENT  DOCUMENTATION CODES Per approved criteria  -Morbid Obesity   INTERVENTION: Add Nepro Shake po TID, each supplement provides 425 kcal and 19 grams protein. Add 30 ml Prostat po BID, each supplement provides 100 kcal and 15 grams protein. Consider diet liberalization if oral intake does not improve. Hopefully oral intake will improve as uremia resolves. RD to continue to follow nutrition care plan.  NUTRITION DIAGNOSIS: Inadequate oral intake related to poor appetite as evidenced by poor meal completion.   Goal: Intake to meet >90% of estimated nutrition needs.  Monitor:  weight trends, lab trends, I/O's, PO intake, supplement tolerance  Reason for Assessment: Low Braden Score  43 y.o. male  Admitting Dx: Acute encephalopathy  ASSESSMENT: PMHx significant for CKD, L BKA, DVT, DM, HTN, gout. Admitted with increased confusion, hypoglycemia and fever. Pt also with wound on RLE that has been worsening x 2-3 weeks. Admitted to [facility] last month 2/2 flu and PNA. Work-up reveals uremia.  Per notes, pt's wife reports that pt has been more somnolent recently and has had poor PO intake. He apparently has not walked for several years and has not been able to stand and transfer for several years.  Underwent first HD treatment 3/7.  Pt unable to tell me his UBW.  WOC RN evaluated pt on 3/6 - pt with necrotic RLE lateral leg wounds with sDTI to R posterior thigh. VVS is not recommending any further vascular interventions. Pt will need debridement to have any changes of healing "if at all." Pt now receiving hydrotherapy.  Currently eating 0-25% of meals. Unable to complete Nutrition Focused Physical Exam 2/2 obese body habitus. Pt is at nutrition risk 2/2 advanced wounds, poor po intake, and chronic medical issues.  CBG's: 139, 136, 147 Potassium low at 3.3 Phosphorus WNL Meds reviewed: renal vite, vitamin D, renvela  Height: Ht Readings from Last 1  Encounters:  01/24/14 6\' 1"  (1.854 m)    Weight: Wt Readings from Last 1 Encounters:  01/24/14 414 lb 7.4 oz (188 kg)    Ideal Body Weight: 172 lb/78.2 kg (adjusted for BKA)  % Ideal Body Weight: 241%  Wt Readings from Last 10 Encounters:  01/24/14 414 lb 7.4 oz (188 kg)  01/11/14 490 lb (222.263 kg)    Usual Body Weight: n/a  % Usual Body Weight: n/a  BMI: 58.3 (adjusted for BKA) Obese Class III  Estimated Nutritional Needs: Kcal: 2200 - 2500 kcal Protein: 125 - 145 Fluid: 1.2 liters  Skin:  sDTI to R thigh Stage I to L and R buttocks   Diet Order: Diabetic  EDUCATION NEEDS: -Education not appropriate at this time   Intake/Output Summary (Last 24 hours) at 01/25/14 1430 Last data filed at 01/25/14 1135  Gross per 24 hour  Intake   1495 ml  Output   2275 ml  Net   -780 ml    Last BM: 3/8  Labs:   Recent Labs Lab 01/22/14 0005 01/22/14 0800 01/22/14 2130 01/23/14 0910  NA 136* 141 140 141  K 4.4 4.2 4.1 3.3*  CL 105 109 108 105  CO2 11* 12* 13* 17*  BUN 83* 81* 78* 54*  CREATININE 4.71* 4.56* 4.19* 3.17*  CALCIUM 8.7 8.5 8.4 8.4  PHOS  --  6.4*  --  4.0  GLUCOSE 119* 116* 115* 83    CBG (last 3)   Recent Labs  01/24/14 1731 01/25/14 0741 01/25/14 1137  GLUCAP 147* 136* 139*    Scheduled Meds: .  acetaminophen  650 mg Oral Once  . allopurinol  100 mg Oral Daily  . amLODipine  10 mg Oral Daily  . apixaban  5 mg Oral BID  . b complex-vitamin c-folic acid  1 tablet Oral QHS  . carvedilol  25 mg Oral BID WC  . cholecalciferol  5,000 Units Oral Once per day on Mon Tue Wed Thu Fri  . cloNIDine  0.2 mg Oral BID  . collagenase   Topical Daily  . [START ON 01/26/2014] darbepoetin (ARANESP) injection - DIALYSIS  100 mcg Intravenous Q Tue-HD  . fluticasone  2 spray Each Nare Daily  . heparin  3,000 Units Intracatheter Once  . hydrALAZINE  100 mg Oral TID  . insulin aspart  0-9 Units Subcutaneous TID WC  . loratadine  10 mg Oral Daily  .  pantoprazole  40 mg Oral Daily  . pentoxifylline  400 mg Oral TID WC  . piperacillin-tazobactam (ZOSYN)  IV  2.25 g Intravenous 3 times per day  . prednisoLONE acetate  1 drop Left Eye QID  . sevelamer carbonate  800 mg Oral TID WC  . sodium chloride  3 mL Intravenous Q12H    Continuous Infusions:   Past Medical History  Diagnosis Date  . Anemia   . Diabetes mellitus without complication   . GERD (gastroesophageal reflux disease)   . Hypertension   . Venous stasis ulcers   . Morbid obesity   . Renal disorder     Past Surgical History  Procedure Laterality Date  . Below knee leg amputation Left     Jarold MottoSamantha Valarie Farace MS, RD, LDN Inpatient Registered Dietitian Pager: [phone] After-hours pager: [phone]

## 2014-01-25 NOTE — Progress Notes (Signed)
Patient ID: Angel Mays, male   DOB: 1971-02-11, 43 y.o.   MRN: [id number] Wound care and plastic surgery evaluation noted. Plan for continued local wound care. Very difficult problem due to lymphedema, venous stasis disease and decubitus wound at this level. Has had marked improvement in his edema in this leg with elevation. Actually now easily palpable 2-3+ dorsalis pedis pulse. Continue local wound care and antibiotic treatment. Will not follow actively. Please call if we can assist.

## 2014-01-25 NOTE — Progress Notes (Signed)
S:Poor appetite. Able to keep fluids down.  CO being "poked and prodded" O:BP 150/70  Pulse 89  Temp(Src) 98.1 F (36.7 C) (Oral)  Resp 18  Ht 6\' 1"  (1.854 m)  Wt 188 kg (414 lb 7.4 oz)  BMI 54.69 kg/m2  SpO2 100%  Intake/Output Summary (Last 24 hours) at 01/25/14 1013 Last data filed at 01/25/14 0700  Gross per 24 hour  Intake   2215 ml  Output   2251 ml  Net    -36 ml   Weight change: -14 kg (-30 lb 13.8 oz) ZOX:WRUEA and alert CVS:RRR Resp:Clear ant Abd:+ BS NTND Ext:+ edema on Rt.  Bandage Rt LE. NEURO:Ox3 no asterixis   . acetaminophen  650 mg Oral Once  . allopurinol  100 mg Oral Daily  . amLODipine  10 mg Oral Daily  . apixaban  5 mg Oral BID  . b complex-vitamin c-folic acid  1 tablet Oral QHS  . carvedilol  25 mg Oral BID WC  . cholecalciferol  5,000 Units Oral Once per day on Mon Tue Wed Thu Fri  . cloNIDine  0.2 mg Oral BID  . collagenase   Topical Daily  . ferrous gluconate  324 mg Oral BID WC  . fluticasone  2 spray Each Nare Daily  . furosemide  20 mg Intravenous Once  . furosemide  20 mg Intravenous Once  . heparin  3,000 Units Intracatheter Once  . hydrALAZINE  100 mg Oral TID  . insulin aspart  0-9 Units Subcutaneous TID WC  . loratadine  10 mg Oral Daily  . pantoprazole  40 mg Oral Daily  . pentoxifylline  400 mg Oral TID WC  . piperacillin-tazobactam (ZOSYN)  IV  3.375 g Intravenous 3 times per day  . prednisoLONE acetate  1 drop Left Eye QID  . sevelamer carbonate  800 mg Oral TID WC  . sodium chloride  3 mL Intravenous Q12H   Mr Tibia Fibula Right Wo Contrast  01/24/2014   CLINICAL DATA:  Osteomyelitis.  EXAM: MRI OF LOWER RIGHT EXTREMITY WITHOUT CONTRAST  TECHNIQUE: Multiplanar, multisequence MR imaging was performed. No intravenous contrast was administered.  COMPARISON:  DG KNEE 1-2V*R* dated 01/12/2013  FINDINGS: After 3 coronal sequences extending from the proximal tibial diaphysis through the ankle joint, the patient refused further  imaging.  No significant abnormal osseous edema signal is noted in the visualized portion of the tibia and fibula on the T2 fat saturated images. There is muscular atrophy throughout the calf along with of muscular and mild subcutaneous edema. No compelling findings of gas within the soft tissues.  IMPRESSION: 1. Muscular and subcutaneous edema in the calf -myositis is not excluded. No obvious abscess. No findings of osteomyelitis. 2. The patient terminated the exam after we obtained 3 coronal sequences. Accordingly the full exam could not be obtained. This reduces diagnostic sensitivity and specificity.   Electronically Signed   By: Herbie Baltimore M.D.   On: 01/24/2014 14:43   BMET    Component Value Date/Time   NA 141 01/23/2014 0910   K 3.3* 01/23/2014 0910   CL 105 01/23/2014 0910   CO2 17* 01/23/2014 0910   GLUCOSE 83 01/23/2014 0910   BUN 54* 01/23/2014 0910   CREATININE 3.17* 01/23/2014 0910   CALCIUM 8.4 01/23/2014 0910   GFRNONAA 22* 01/23/2014 0910   GFRAA 26* 01/23/2014 0910   CBC    Component Value Date/Time   WBC 6.2 01/24/2014 0350   RBC 2.58* 01/24/2014 0350  RBC 2.70* 01/22/2014 2130   HGB 7.0* 01/24/2014 0350   HCT 21.8* 01/24/2014 0350   PLT 195 01/24/2014 0350   MCV 84.5 01/24/2014 0350   MCH 27.1 01/24/2014 0350   MCHC 32.1 01/24/2014 0350   RDW 16.9* 01/24/2014 0350   LYMPHSABS 1.0 01/22/2014 0800   MONOABS 0.8 01/22/2014 0800   EOSABS 0.1 01/22/2014 0800   BASOSABS 0.1 01/22/2014 0800     Assessment: 1. ESRD 2. Anemia 3. DM 4. Decubitus ulcers  Plan: 1 HD in AM 2. Check PTH 3. DC PO iron 4. Renal diet 5. Start aranesp,   If primary team wants to transfuse then would rec doing it tomorrow at HD 6.  DC IV fluids   Ky Moskowitz T

## 2014-01-26 LAB — TYPE AND SCREEN
ABO/RH(D): AB POS
Antibody Screen: NEGATIVE
Unit division: 0
Unit division: 0

## 2014-01-26 LAB — GLUCOSE, CAPILLARY
GLUCOSE-CAPILLARY: 178 mg/dL — AB (ref 70–99)
Glucose-Capillary: 137 mg/dL — ABNORMAL HIGH (ref 70–99)
Glucose-Capillary: 146 mg/dL — ABNORMAL HIGH (ref 70–99)
Glucose-Capillary: 174 mg/dL — ABNORMAL HIGH (ref 70–99)

## 2014-01-26 LAB — RENAL FUNCTION PANEL
Albumin: 2.6 g/dL — ABNORMAL LOW (ref 3.5–5.2)
BUN: 30 mg/dL — AB (ref 6–23)
CO2: 21 mEq/L (ref 19–32)
CREATININE: 2.27 mg/dL — AB (ref 0.50–1.35)
Calcium: 8.3 mg/dL — ABNORMAL LOW (ref 8.4–10.5)
Chloride: 105 mEq/L (ref 96–112)
GFR calc Af Amer: 39 mL/min — ABNORMAL LOW (ref >90)
GFR calc non Af Amer: 33 mL/min — ABNORMAL LOW (ref >90)
GLUCOSE: 148 mg/dL — AB (ref 70–99)
PHOSPHORUS: 2.7 mg/dL (ref 2.3–4.6)
Potassium: 3.2 mEq/L — ABNORMAL LOW (ref 3.7–5.3)
Sodium: 143 mEq/L (ref 137–147)

## 2014-01-26 LAB — CBC
HEMATOCRIT: 23.9 % — AB (ref 39.0–52.0)
HEMOGLOBIN: 8 g/dL — AB (ref 13.0–17.0)
MCH: 27.9 pg (ref 26.0–34.0)
MCHC: 33.5 g/dL (ref 30.0–36.0)
MCV: 83.3 fL (ref 78.0–100.0)
Platelets: 181 10*3/uL (ref 150–400)
RBC: 2.87 MIL/uL — ABNORMAL LOW (ref 4.22–5.81)
RDW: 16.2 % — ABNORMAL HIGH (ref 11.5–15.5)
WBC: 8 10*3/uL (ref 4.0–10.5)

## 2014-01-26 MED ORDER — METOCLOPRAMIDE HCL 5 MG PO TABS
5.0000 mg | ORAL_TABLET | Freq: Three times a day (TID) | ORAL | Status: DC
  Administered 2014-01-26 – 2014-01-28 (×7): 5 mg via ORAL
  Filled 2014-01-26 (×9): qty 1

## 2014-01-26 MED ORDER — DARBEPOETIN ALFA-POLYSORBATE 100 MCG/0.5ML IJ SOLN
INTRAMUSCULAR | Status: AC
  Filled 2014-01-26: qty 0.5

## 2014-01-26 MED ORDER — CLINDAMYCIN PHOSPHATE 300 MG/50ML IV SOLN
300.0000 mg | Freq: Three times a day (TID) | INTRAVENOUS | Status: DC
  Administered 2014-01-26 – 2014-01-28 (×7): 300 mg via INTRAVENOUS
  Filled 2014-01-26 (×10): qty 50

## 2014-01-26 MED ORDER — VANCOMYCIN HCL IN DEXTROSE 1-5 GM/200ML-% IV SOLN
1000.0000 mg | Freq: Once | INTRAVENOUS | Status: AC
  Administered 2014-01-26: 1000 mg via INTRAVENOUS
  Filled 2014-01-26: qty 200

## 2014-01-26 NOTE — Procedures (Signed)
Pt seen on HD. AP 120 Vp 110.  SBP 131. K 3.2 on 4K. Tolerating HD well so far.

## 2014-01-26 NOTE — Progress Notes (Signed)
Physical Therapy Wound Treatment Patient Details  Name: Angel Mays MRN: [id number] Date of Birth: [birth year]  Today's Date: 01/26/2014 Time: 4970-2637 Time Calculation (min): 35 min  Subjective  Subjective: Pt asking how wounds look.  Pain Score: Pain Score: Pt premedicated. Painful area in some parts of proximal wound.  Wound Assessment  Wound / Incision (Open or Dehisced) 01/22/14 Other (Comment) Leg Right;Proximal;Lateral;Lower (Active)  Dressing Type Gauze (Comment);Moist to dry 01/26/2014  4:08 PM  Dressing Changed Changed 01/26/2014  4:08 PM  Dressing Status Clean;Dry;Intact 01/26/2014  4:08 PM  Dressing Change Frequency Daily 01/26/2014  4:08 PM  Site / Wound Assessment Brown;Dusky;Granulation tissue;Painful;Pale;Pink;Yellow 01/26/2014  4:08 PM  % Wound base Red or Granulating 5% 01/26/2014  4:08 PM  % Wound base Yellow 30% 01/26/2014  4:08 PM  % Wound base Black 65% 01/26/2014  4:08 PM  Peri-wound Assessment Intact;Edema 01/26/2014  4:08 PM  Wound Length (cm) 10.8 cm 01/25/2014  9:45 AM  Wound Width (cm) 5 cm 01/25/2014  9:45 AM  Wound Depth (cm) 0.3 cm 01/25/2014  9:45 AM  Margins Unattached edges (unapproximated) 01/26/2014  4:08 PM  Closure None 01/23/2014  7:00 PM  Drainage Amount Minimal 01/26/2014  4:08 PM  Drainage Description Purulent 01/26/2014  4:08 PM  Treatment Debridement (Selective);Hydrotherapy (Ultrasonic mist);Packing (Saline gauze) 01/26/2014  4:08 PM     Wound / Incision (Open or Dehisced) 01/25/14 Rt lateral lower leg middle wound (Active)  Dressing Type Moist to dry;Gauze (Comment) 01/26/2014  4:08 PM  Dressing Changed Changed 01/26/2014  4:08 PM  Dressing Status Clean;Dry;Intact 01/26/2014  4:08 PM  Dressing Change Frequency Daily 01/26/2014  4:08 PM  Site / Wound Assessment Brown;Pale;Pink;Yellow 01/26/2014  4:08 PM  % Wound base Red or Granulating 40% 01/26/2014  4:08 PM  % Wound base Yellow 60% 01/26/2014  4:08 PM  % Wound base Black 0% 01/26/2014  4:08 PM  % Wound base  Other (Comment) 0% 01/26/2014  4:08 PM  Peri-wound Assessment Intact;Edema 01/26/2014  4:08 PM  Wound Length (cm) 4.6 cm 01/25/2014  9:45 AM  Wound Width (cm) 4.4 cm 01/25/2014  9:45 AM  Wound Depth (cm) 0.4 cm 01/25/2014  9:45 AM  Margins Unattached edges (unapproximated) 01/26/2014  4:08 PM  Drainage Amount Minimal 01/26/2014  4:08 PM  Drainage Description Serosanguineous 01/26/2014  4:08 PM  Treatment Debridement (Selective);Hydrotherapy (Ultrasonic mist);Packing (Saline gauze) 01/26/2014  4:08 PM     Wound / Incision (Open or Dehisced) 01/25/14 Rt lateral lower leg distal (Active)  Dressing Type Moist to dry;Gauze (Comment) 01/26/2014  4:08 PM  Dressing Changed Changed 01/26/2014  4:08 PM  Dressing Status Clean;Dry;Intact 01/26/2014  4:08 PM  Dressing Change Frequency Daily 01/26/2014  4:08 PM  Site / Wound Assessment Pink;Yellow 01/26/2014  4:08 PM  % Wound base Red or Granulating 90% 01/26/2014  4:08 PM  % Wound base Yellow 10% 01/26/2014  4:08 PM  % Wound base Black 0% 01/26/2014  4:08 PM  % Wound base Other (Comment) 0% 01/26/2014  4:08 PM  Peri-wound Assessment Intact;Edema 01/26/2014  4:08 PM  Wound Length (cm) 3.4 cm 01/25/2014  9:45 AM  Wound Width (cm) 3.7 cm 01/25/2014  9:45 AM  Wound Depth (cm) 0.1 cm 01/25/2014  9:45 AM  Margins Unattached edges (unapproximated) 01/26/2014  4:08 PM  Drainage Amount Minimal 01/26/2014  4:08 PM  Drainage Description Serous 01/26/2014  4:08 PM  Treatment Debridement (Selective);Hydrotherapy (Ultrasonic mist);Packing (Saline gauze) 01/26/2014  4:08 PM   Hydrotherapy Ultrasonic mist  - wound  location: Rt lateral lower leg, 3 wounds Ultrasonic mist at 35KHz (+/-3KHz) at ___ percent: 100 % Ultrasonic mist therapy minutes: 8 min Selective Debridement Selective Debridement - Location: Rt lateral lower leg Selective Debridement - Tools Used: Forceps;Scissors Selective Debridement - Tissue Removed: yellow, brown, black eschar   Wound Assessment and Plan  Wound Therapy -  Assess/Plan/Recommendations Wound Therapy - Clinical Statement: Steady progress. Hydrotherapy Plan: Debridement;Dressing change;Patient/family education;Pulsatile lavage with suction;Ultrasonic wound therapy @35  KHz (+/- 3 KHz) Wound Therapy - Frequency: 6X / week Wound Therapy - Current Recommendations: Case manager/social work Wound Therapy - Follow Up Recommendations: Other (comment) (LTACH) Wound Plan: see above  Wound Therapy Goals- Improve the function of patient's integumentary system by progressing the wound(s) through the phases of wound healing (inflammation - proliferation - remodeling) by: Decrease Necrotic Tissue to: 75 Decrease Necrotic Tissue - Progress: Progressing toward goal Increase Granulation Tissue to: 25 Increase Granulation Tissue - Progress: Progressing toward goal Improve Drainage Characteristics: Min;Serous Improve Drainage Characteristics - Progress: Progressing toward goal  Goals will be updated until maximal potential achieved or discharge criteria met.  Discharge criteria: when goals achieved, discharge from hospital, MD decision/surgical intervention, no progress towards goals, refusal/missing three consecutive treatments without notification or medical reason.  GP     Angel Mays 01/26/2014, 4:17 PM  [facility] PT [phone]

## 2014-01-26 NOTE — Progress Notes (Signed)
VASCULAR LAB PRELIMINARY  ARTERIAL  ABI completed:    RIGHT    LEFT    PRESSURE WAVEFORM  PRESSURE WAVEFORM  BRACHIAL 161 Triphasic BRACHIAL 162 Triphasic  DP * Triphasic DP  BKA  AT   AT    PT * Monophasic PT  BKA  PER   PER    GREAT TOE 125 NA GREAT TOE  NA    RIGHT LEFT  ABI * BKA   *Unable to obtain pressures due to bandage/wound location, therefore could not calculate ABI.  The right great toe pressure is suggestive of adequate perfusion for healing.   01/26/2014 1:11 PM Gertie FeyMichelle Timmie Calix, RVT, RDCS, RDMS

## 2014-01-26 NOTE — Progress Notes (Addendum)
Note: This document was prepared with digital dictation and possible smart phrase technology. Any transcriptional errors that result from this process are unintentional.   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 His R leg has been "dark " for several months. He was seen by a wound nurse for this.  He also has wounds on his bottom which have been persistent since Jan 2015 since ICU [facility] for 2 weeks for PNA and h. flu-Went in for routine cataract surgery-Had Htn crisis and noted LVH severe then  He has not really been himself as usual  Supposed to have dialysis shunt placed as an outpatient couple 02/04/14-Dr. [name] is as primary nephrologist   Labs on admit=  PH 7.116, PCO2 32.5, PO2 103 sodium 136, BUN 83, creatinine 4.7 White count 10.7, hemoglobin 8.5, INR 2.24 UA = ketones, hyaline casts Renal ultrasound-renal parenchymal thinning Chest x-ray = cardio megaly, central pulmonary vascular prominence CT head 3/6 = no abnormality   Past medical history-As per Problem list Chart reviewed as below-   Consultants:  Critical care made aware-patient stable however  Vascular surgery  Nephrology  Wound nurse  Procedures:  3/6 Triple lumen HD catheter placed left internal jugular vein  MRI 3/8  ABI attempted 3/10  Antibiotics:  Vancomycin 3/5-3/10  Zosyn 3/6-3/10  Clidamycin 3/10--->complete 2 weeks total 3/19   Subjective     About to undergo hydrotherapy with physical therapy Please see below picture for foot once  No fevers or chills Not at all hungry-feel somewhat nauseated and only able to take one can nepro  no family present   Objective    Interim History: none  Telemetry: Sinus   Objective: Filed Vitals:   01/26/14 1030 01/26/14 1100 01/26/14 1130 01/26/14 1214  BP: 128/66 125/67 132/72 128/62  Pulse: 80 81 78 76  Temp:    98.6 F (37 C)  TempSrc:    Oral  Resp: 11 11 11 19   Height:      Weight:    188 kg (414 lb 7.4 oz)  SpO2:    98%    Intake/Output Summary (Last 24 hours) at 01/26/14 1255 Last data filed at 01/26/14 1214  Gross per 24 hour  Intake    600 ml  Output   3806 ml  Net  -3206 ml    Exam:  HEENT-AO X3 CHEST-clear to auscultation  CARDIAC- S1-S2 no murmur rub or gallop  ABDOMEN-obese nontender nondistended  NEURO- no asterixis, much more oriented, moving all 4 limbs equally with intact left BKA SKIN/MUSCULAR-not examined today     Data Reviewed: Basic Metabolic Panel:  Recent Labs Lab 01/22/14 0005 01/22/14 0800 01/22/14 2130 01/23/14 0910 01/25/14 2253 01/26/14 0815  NA 136* 141 140 141 143 143  K 4.4 4.2 4.1 3.3* 3.1* 3.2*  CL 105 109 108 105 105 105  CO2 11* 12* 13* 17* 20 21  GLUCOSE 119* 116* 115* 83 155* 148*  BUN 83* 81* 78* 54* 33* 30*  CREATININE 4.71* 4.56*  4.19* 3.17* 2.41* 2.27*  CALCIUM 8.7 8.5 8.4 8.4 8.5 8.3*  MG  --   --   --   --  1.7  --   PHOS  --  6.4*  --  4.0 2.4 2.7   Liver Function Tests:  Recent Labs Lab 01/22/14 0005 01/22/14 0800 01/23/14 0910 01/25/14 2253 01/26/14 0815  AST 16 14  --  14  --   ALT <5 <5  --  6  --   ALKPHOS 105 105  --  81  --   BILITOT 0.3 0.3  --  0.3  --   PROT 7.9 8.0  --  7.2  --   ALBUMIN 3.2* 3.1* 2.7* 2.7* 2.6*   No results found for this basename: LIPASE, AMYLASE,  in the last 168 hours No results found for this basename: AMMONIA,  in the last 168 hours CBC:  Recent  Labs Lab 01/22/14 0005 01/22/14 0800 01/23/14 0110 01/24/14 0350 01/25/14 2253 01/26/14 0815  WBC 10.7* 10.1 8.3 6.2 8.0 8.0  NEUTROABS 8.8* 8.1*  --   --   --   --   HGB 8.5* 8.3* 7.6* 7.0* 8.2* 8.0*  HCT 27.4* 26.4* 23.7* 21.8* 25.1* 23.9*  MCV 91.6 90.4 86.8 84.5 83.1 83.3  PLT 203 200 225 195 186 181   Cardiac Enzymes:  Recent Labs Lab 01/22/14 0005  TROPONINI <0.30   BNP: No components found with this basename: POCBNP,  CBG:  Recent Labs Lab 01/25/14 0741 01/25/14 1137 01/25/14 1638 01/25/14 2153 01/26/14 0737  GLUCAP 136* 139* 140* 152* 146*    Recent Results (from the past 240 hour(s))  CULTURE, BLOOD (ROUTINE X 2)     Status: None   Collection Time    01/21/14 11:42 PM      Result Value Ref Range Status   Specimen Description BLOOD RIGHT ARM   Final   Special Requests BOTTLES DRAWN AEROBIC AND ANAEROBIC 10CC EACH   Final   Culture  Setup Time     Final   Value: 01/22/2014 05:19     Performed at [facility]   Culture     Final   Value:        BLOOD CULTURE RECEIVED NO GROWTH TO DATE CULTURE WILL BE HELD FOR 5 DAYS BEFORE ISSUING A FINAL NEGATIVE REPORT     Performed at [facility]   Report Status PENDING   Incomplete  CULTURE, BLOOD (ROUTINE X 2)     Status: None   Collection Time    01/22/14 12:05 AM      Result Value Ref Range Status   Specimen Description BLOOD LEFT ARM   Final   Special Requests BOTTLES DRAWN AEROBIC ONLY 10CC   Final   Culture  Setup Time     Final   Value: 01/22/2014 05:19     Performed at [facility]   Culture     Final   Value:        BLOOD CULTURE RECEIVED NO GROWTH TO DATE CULTURE WILL BE HELD FOR 5 DAYS BEFORE ISSUING A FINAL NEGATIVE REPORT     Performed at [facility]   Report Status PENDING   Incomplete  MRSA PCR SCREENING     Status: None   Collection Time    01/22/14  7:22 AM      Result Value Ref Range Status   MRSA by PCR NEGATIVE  NEGATIVE Final   Comment:  The  GeneXpert MRSA Assay (FDA     approved for NASAL specimens     only), is one component of a     comprehensive MRSA colonization     surveillance program. It is not     intended to diagnose MRSA     infection nor to guide or     monitor treatment for     MRSA infections.     Studies:              All Imaging reviewed and is as per above notation   Scheduled Meds: . acetaminophen  650 mg Oral Once  . allopurinol  100 mg Oral Daily  . amLODipine  10 mg Oral Daily  . apixaban  5 mg Oral BID  . b complex-vitamin c-folic acid  1 tablet Oral QHS  . carvedilol  25 mg Oral BID WC  . cholecalciferol  5,000 Units Oral Once per day on Mon Tue Wed Thu Fri  . cloNIDine  0.2 mg Oral BID  . collagenase   Topical Daily  . darbepoetin (ARANESP) injection - DIALYSIS  100 mcg Intravenous Q Tue-HD  . feeding supplement (NEPRO CARB STEADY)  237 mL Oral TID BM  . feeding supplement (PRO-STAT SUGAR FREE 64)  30 mL Oral BID  . fluticasone  2 spray Each Nare Daily  . heparin  3,000 Units Intracatheter Once  . hydrALAZINE  100 mg Oral TID  . insulin aspart  0-9 Units Subcutaneous TID WC  . loratadine  10 mg Oral Daily  . pantoprazole  40 mg Oral Daily  . pentoxifylline  400 mg Oral TID WC  . piperacillin-tazobactam (ZOSYN)  IV  2.25 g Intravenous 3 times per day  . prednisoLONE acetate  1 drop Left Eye QID  . sevelamer carbonate  800 mg Oral TID WC  . sodium chloride  3 mL Intravenous Q12H   Continuous Infusions:     Assessment/Plan:  1. Acute toxic metabolic encephalopathy-secondary to uremia-resolved 2. Acute metabolic acidosis, multifactorial, DDX = sepsis + Impending uremia-Unlikely Salicyclate tox-Continue broad-spectrum Abx. First dialysis = 3/7-much more oriented now-was on Vancomycin/Zosyn since 3/6.  Will taper to Clindamycin and monitor repsonse 3. Nausea-potentially related to uremia-will add Reglan 5 tid ac to see if this helps at all 4. ESRD -see above-Tunneled HD cath placed  3/6-defer to nephrology regarding continued dialysis need-he is not a candidate from my discussion with Dr. Scot Dock today for permanent access placement until wounds heal 5. Probable diabetic/Lymphedema wound right side --patient may eventually end up with another amputation unfortunately -vascular states pulses notable so not ischemia-Plastic surgery consulted 3/9 and appreciate their continued input [supposed to see wound 3/11].  MRI nonspecific, nonsensitive as patient couldn't complete study 3/8 6. Lymphedema -multifactorial , potentially secondary to post DVT swelling history   7. DVT , status post filter placement -since no imminent surgical procedure  Xarelto-->apixaban.   8. Diabetes mellitus-blood sugars -138-136. Not eating.   9. Hypertension-continue amlodipine 10 daily, Coreg 25 twice a day, clonidine 0.2 twice a day, hydralazine 100 3 times a day.   10. Metabolic bone disease -defer to nephrology 11. 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 defer ESA->Renal-TIBC 149.   12. Super Morbid obesity, Body mass index is 59.03 kg/(m^2).-life threatening 50. Debility-Patient bed bound at baseline-[place] decision making re: dialysis challenging.  Social worker/family have discussed that options and placement at a skilled nursing facility that might have dialysis capabilities-we still need  to determine what to do with his right lower extremity prior to making these decisions    Code Status: FUll Family Communication: none present.  Disposition Plan: SDU--> telemetry.  He will need to be clipped-eventually will need dialysis access once wounds are healed. He will also need a facility that can transport him back and forth to dialysis. At baseline he is wheelchair bound but has not been able to sit up out of the bed from admission. Physical therapy to see him to determine what his level of function is. He may benefit from skilled facility vs. LTACH  discharge, which I will discuss with CM today   Verneita Griffes, MD  [facility] Pager [phone] 01/26/2014, 12:55 PM    LOS: 5 days

## 2014-01-27 ENCOUNTER — Inpatient Hospital Stay (HOSPITAL_COMMUNITY): Payer: Medicare Other

## 2014-01-27 ENCOUNTER — Other Ambulatory Visit: Payer: Self-pay

## 2014-01-27 DIAGNOSIS — N186 End stage renal disease: Secondary | ICD-10-CM

## 2014-01-27 DIAGNOSIS — E872 Acidosis, unspecified: Secondary | ICD-10-CM

## 2014-01-27 DIAGNOSIS — G9340 Encephalopathy, unspecified: Secondary | ICD-10-CM

## 2014-01-27 DIAGNOSIS — E860 Dehydration: Secondary | ICD-10-CM

## 2014-01-27 DIAGNOSIS — I10 Essential (primary) hypertension: Secondary | ICD-10-CM

## 2014-01-27 DIAGNOSIS — L97909 Non-pressure chronic ulcer of unspecified part of unspecified lower leg with unspecified severity: Secondary | ICD-10-CM

## 2014-01-27 DIAGNOSIS — N189 Chronic kidney disease, unspecified: Secondary | ICD-10-CM

## 2014-01-27 DIAGNOSIS — I998 Other disorder of circulatory system: Secondary | ICD-10-CM

## 2014-01-27 DIAGNOSIS — Z86718 Personal history of other venous thrombosis and embolism: Secondary | ICD-10-CM

## 2014-01-27 DIAGNOSIS — E119 Type 2 diabetes mellitus without complications: Secondary | ICD-10-CM

## 2014-01-27 LAB — GLUCOSE, CAPILLARY
Glucose-Capillary: 193 mg/dL — ABNORMAL HIGH (ref 70–99)
Glucose-Capillary: 178 mg/dL — ABNORMAL HIGH (ref 70–99)
Glucose-Capillary: 206 mg/dL — ABNORMAL HIGH (ref 70–99)
Glucose-Capillary: 174 mg/dL — ABNORMAL HIGH (ref 70–99)

## 2014-01-27 LAB — PTH, INTACT AND CALCIUM
CALCIUM TOTAL (PTH): 7.6 mg/dL — AB (ref 8.4–10.5)
PTH: 96.2 pg/mL — AB (ref 14.0–72.0)

## 2014-01-27 MED ORDER — DIPHENHYDRAMINE HCL 25 MG PO CAPS
25.0000 mg | ORAL_CAPSULE | Freq: Once | ORAL | Status: AC
  Administered 2014-01-27: 25 mg via ORAL
  Filled 2014-01-27: qty 1

## 2014-01-27 NOTE — Progress Notes (Signed)
[facility] PROGRESS NOTE  Angel Mays ZOX:[id number] DOB: 1971/04/10 DOA: 01/21/2014 PCP: [name], MD  Assessment/Plan:  Acute toxic metabolic encephalopathy  -secondary to uremia-resolved  Acute metabolic acidosis -, multifactorial, DDX = sepsis + Impending uremia-Unlikely Salicyclate tox-Continue broad-spectrum Abx. First dialysis = 3/7-much more oriented now-was on Vancomycin/Zosyn since 3/6. Will taper to Clindamycin and monitor repsonse  Nausea -potentially related to uremia -Continue Reglan 5 tid ac to see if this helps at all  ESRD -Tunneled HD cath placed 3/6-defer to nephrology regarding continued dialysis need -Per Dr. Scot Dock today for permanent access placement until wounds heal  Probable diabetic/Lymphedema wound right side  --patient may eventually end up with another amputation unfortunately  -vascular states pulses notable so not ischemia-Plastic surgery consulted 3/9 and appreciate their continued input [supposed to see wound 3/11]. MRI nonspecific, nonsensitive as patient couldn't complete study 3/8  Lymphedema  -multifactorial , potentially secondary to post DVT swelling history   DVT  -, status post filter placement  -since no imminent surgical procedure Xarelto-->apixaban.  Diabetes mellitus -blood sugars -138-136. Not eating.  -Nutrition performing calorie count  Hypertension -continue amlodipine 10 daily, Coreg 25 twice a day, clonidine 0.2 twice a day, hydralazine 100 3 times a day.   Metabolic bone disease  -defer to nephrology  Anemia of renal disease, possible nutritional component-started on by mouth iron today.  defer ESA->Renal-TIBC 149.  Disability  -Patient bed bound at baseline-[place] decision making re: dialysis challenging. Social worker/family have discussed that options and placement at a skilled nursing facility that might have dialysis capabilities-we still need to determine what to do with his right lower extremity prior  to making these decisions    Right hand pain -Right third/fourth PCP joints tender to palpation and unable to be straightened fully x-ray pending of right hand     Code Status: Full Family Communication: Wife and daughter present Disposition Plan: Per nephrology  Consultants:    Procedures:    Antibiotics: Clindamycin 3/10>>    HPI/Subjective: 43 y/o BM PMHx S/P Lt BKA jul 2013-performed at [facility] [reconstructive surgery]-->Transferred to [facility] since September 2013, PMHx right leg ulcer, HTN, Hx DVT (on chronic anticoagulation currently Apixaban was on Xarelto prior to admission; started 1999)/IVC placed January 2012, diabetes mellitus type 2, anemia, acute encephalopathy CKD 4-5, Calculated eGFR ~15, Super Morbid obesity BMI 59.03 kg/(m^2). Known Diabetic foot ulcers Rt side followed by wound care, admitted early 01/22/2014 with increasing confusion in setting of hypoglycemia, and foul odour from RLE wound  Wound first noticed ~ February mid 2015. His R leg has been "dark " for several months. He was seen by a wound nurse for this.  He also has wounds on his bottom which have been persistent since Jan 2015 since ICU [facility] for 2 weeks for PNA and h. flu-Went in for routine cataract surgery-Had Htn crisis and noted LVH severe then He has not really been himself as usual Supposed to have dialysis shunt placed as an outpatient couple 02/04/14-Dr. [name] is as primary nephrologist . 3/11 patient complains of right hand pain which has worsened over last couple of weeks.   Objective: Filed Vitals:   01/26/14 2328 01/27/14 0448 01/27/14 0815 01/27/14 1123  BP: 162/69 136/67 154/61 170/63  Pulse: 89 88 82 81  Temp: 98.7 F (37.1 C) 99 F (37.2 C) 99 F (37.2 C) 98.9 F (37.2 C)  TempSrc: Oral Oral Oral Oral  Resp: _0 Height:  Weight:      SpO2: 99% 94% 99% 99%    Intake/Output Summary (Last 24 hours) at 01/27/14 1519 Last data  filed at 01/27/14 1300  Gross per 24 hour  Intake    360 ml  Output   1000 ml  Net   -640 ml   Filed Weights   01/26/14 0526 01/26/14 0802 01/26/14 1214  Weight: 200 kg (440 lb 14.7 oz) 190 kg (418 lb 14 oz) 188 kg (414 lb 7.4 oz)    Exam:   General:  A./O. x4, NAD  Cardiovascular: Regular rhythm and rate, negative murmurs rubs gallops, right DP/PT pulse one plus  Respiratory: Clear to auscultation bilateral  Abdomen: Morbidly obese, soft, nontender, plus bowel sound  Musculoskeletal: Left BKA well-healed, right lower extremity lymphedema/open ulcers covered and clean,  Right third/fourth PCP joints tender to palpation and unable to be straightened fully   Data Reviewed: Basic Metabolic Panel:  Recent Labs Lab 01/22/14 0005 01/22/14 0800 01/22/14 2130 01/23/14 0910 01/25/14 2253 01/26/14 0815 01/26/14 0821  NA 136* 141 140 141 143 143  --   K 4.4 4.2 4.1 3.3* 3.1* 3.2*  --   CL 105 109 108 105 105 105  --   CO2 11* 12* 13* 17* 20 21  --   GLUCOSE 119* 116* 115* 83 155* 148*  --   BUN 83* 81* 78* 54* 33* 30*  --   CREATININE 4.71* 4.56* 4.19* 3.17* 2.41* 2.27*  --   CALCIUM 8.7 8.5 8.4 8.4 8.5 8.3* 7.6*  MG  --   --   --   --  1.7  --   --   PHOS  --  6.4*  --  4.0 2.4 2.7  --    Liver Function Tests:  Recent Labs Lab 01/22/14 0005 01/22/14 0800 01/23/14 0910 01/25/14 2253 01/26/14 0815  AST 16 14  --  14  --   ALT <5 <5  --  6  --   ALKPHOS 105 105  --  81  --   BILITOT 0.3 0.3  --  0.3  --   PROT 7.9 8.0  --  7.2  --   ALBUMIN 3.2* 3.1* 2.7* 2.7* 2.6*   No results found for this basename: LIPASE, AMYLASE,  in the last 168 hours No results found for this basename: AMMONIA,  in the last 168 hours CBC:  Recent Labs Lab 01/22/14 0005 01/22/14 0800 01/23/14 0110 01/24/14 0350 01/25/14 2253 01/26/14 0815  WBC 10.7* 10.1 8.3 6.2 8.0 8.0  NEUTROABS 8.8* 8.1*  --   --   --   --   HGB 8.5* 8.3* 7.6* 7.0* 8.2* 8.0*  HCT 27.4* 26.4* 23.7* 21.8* 25.1*  23.9*  MCV 91.6 90.4 86.8 84.5 83.1 83.3  PLT 203 200 225 195 186 181   Cardiac Enzymes:  Recent Labs Lab 01/22/14 0005  TROPONINI <0.30   BNP (last 3 results) No results found for this basename: PROBNP,  in the last 8760 hours CBG:  Recent Labs Lab 01/26/14 1245 01/26/14 1655 01/26/14 2331 01/27/14 0814 01/27/14 1122  GLUCAP 137* 178* 174* 193* 178*    Recent Results (from the past 240 hour(s))  CULTURE, BLOOD (ROUTINE X 2)     Status: None   Collection Time    01/21/14 11:42 PM      Result Value Ref Range Status   Specimen Description BLOOD RIGHT ARM   Final   Special Requests BOTTLES DRAWN AEROBIC AND ANAEROBIC 10CC  EACH   Final   Culture  Setup Time     Final   Value: 01/22/2014 05:19     Performed at [facility]   Culture     Final   Value:        BLOOD CULTURE RECEIVED NO GROWTH TO DATE CULTURE WILL BE HELD FOR 5 DAYS BEFORE ISSUING A FINAL NEGATIVE REPORT     Performed at [facility]   Report Status PENDING   Incomplete  CULTURE, BLOOD (ROUTINE X 2)     Status: None   Collection Time    01/22/14 12:05 AM      Result Value Ref Range Status   Specimen Description BLOOD LEFT ARM   Final   Special Requests BOTTLES DRAWN AEROBIC ONLY 10CC   Final   Culture  Setup Time     Final   Value: 01/22/2014 05:19     Performed at [facility]   Culture     Final   Value:        BLOOD CULTURE RECEIVED NO GROWTH TO DATE CULTURE WILL BE HELD FOR 5 DAYS BEFORE ISSUING A FINAL NEGATIVE REPORT     Performed at [facility]   Report Status PENDING   Incomplete  MRSA PCR SCREENING     Status: None   Collection Time    01/22/14  7:22 AM      Result Value Ref Range Status   MRSA by PCR NEGATIVE  NEGATIVE Final   Comment:            The GeneXpert MRSA Assay (FDA     approved for NASAL specimens     only), is one component of a     comprehensive MRSA colonization     surveillance program. It is not     intended to diagnose MRSA      infection nor to guide or     monitor treatment for     MRSA infections.     Studies: No results found.  Scheduled Meds: . acetaminophen  650 mg Oral Once  . allopurinol  100 mg Oral Daily  . amLODipine  10 mg Oral Daily  . apixaban  5 mg Oral BID  . b complex-vitamin c-folic acid  1 tablet Oral QHS  . carvedilol  25 mg Oral BID WC  . cholecalciferol  5,000 Units Oral Once per day on Mon Tue Wed Thu Fri  . clindamycin (CLEOCIN) IV  300 mg Intravenous 3 times per day  . cloNIDine  0.2 mg Oral BID  . collagenase   Topical Daily  . darbepoetin (ARANESP) injection - DIALYSIS  100 mcg Intravenous Q Tue-HD  . feeding supplement (NEPRO CARB STEADY)  237 mL Oral TID BM  . feeding supplement (PRO-STAT SUGAR FREE 64)  30 mL Oral BID  . fluticasone  2 spray Each Nare Daily  . heparin  3,000 Units Intracatheter Once  . hydrALAZINE  100 mg Oral TID  . insulin aspart  0-9 Units Subcutaneous TID WC  . loratadine  10 mg Oral Daily  . metoCLOPramide  5 mg Oral TID AC  . pantoprazole  40 mg Oral Daily  . pentoxifylline  400 mg Oral TID WC  . prednisoLONE acetate  1 drop Left Eye QID  . sodium chloride  3 mL Intravenous Q12H   Continuous Infusions:   Principal Problem:   Acute encephalopathy Active Problems:   Metabolic acidosis   CKD (chronic kidney disease)  Leg ulcer   Diabetes mellitus   Anemia   Gout   HTN (hypertension)   History of DVT (deep vein thrombosis)   Poor venous access    Time spent: 40 minute   WOODS, CURTIS, J  [facility] Pager [phone]. If 7PM-7AM, please contact night-coverage at [email or web address], password [facility] 01/27/2014, 3:19 PM  LOS: 6 days

## 2014-01-27 NOTE — Progress Notes (Addendum)
Physical Therapy Wound Treatment Patient Details  Name: Angel Mays MRN: [id number] Date of Birth: 07/23/1971  Today's Date: 01/27/2014 Time: 7673-4193 Time Calculation (min): 50 min  Subjective  Subjective: Pt reports the pain was slighty better with pulsatile lavage than with ultrasonic mist.  Pain Score:  Pt premedicated. See above.  Wound Assessment  Wound / Incision (Open or Dehisced) 01/22/14 Other (Comment) Leg Right;Proximal;Lateral;Lower (Active)  Dressing Type Moist to dry;Gauze (Comment); santyl 01/27/2014  1:29 PM  Dressing Changed Changed 01/27/2014  1:29 PM  Dressing Status Clean;Dry 01/27/2014  1:29 PM  Dressing Change Frequency Daily 01/27/2014  1:29 PM  Site / Wound Assessment Yellow;Pink;Red;Brown;Granulation tissue;Painful;Dusky 01/27/2014  1:29 PM  % Wound base Red or Granulating 10% 01/27/2014  1:29 PM  % Wound base Yellow 85% 01/27/2014  1:29 PM  % Wound base Black 5% 01/27/2014  1:29 PM  Peri-wound Assessment Intact;Edema 01/27/2014  1:29 PM  Wound Length (cm) 10.8 cm 01/25/2014  9:45 AM  Wound Width (cm) 5 cm 01/25/2014  9:45 AM  Wound Depth (cm) 0.3 cm 01/25/2014  9:45 AM  Margins Unattached edges (unapproximated) 01/27/2014  1:29 PM  Closure None 01/27/2014  1:29 PM  Drainage Amount Minimal 01/27/2014  1:29 PM  Drainage Description Serous 01/27/2014  1:29 PM  Treatment Debridement (Selective);Hydrotherapy (Pulse lavage);Packing (Saline gauze) 01/27/2014  1:29 PM     Wound / Incision (Open or Dehisced) 01/25/14 Rt lateral lower leg middle wound (Active)  Dressing Type Moist to dry;Gauze (Comment);santyl 01/27/2014  1:29 PM  Dressing Changed Changed 01/27/2014  1:29 PM  Dressing Status Clean;Dry;Intact 01/27/2014  1:29 PM  Dressing Change Frequency Daily 01/27/2014  1:29 PM  Site / Wound Assessment Yellow;Pale;Pink 01/27/2014  1:29 PM  % Wound base Red or Granulating 40% 01/27/2014  1:29 PM  % Wound base Yellow 60% 01/27/2014  1:29 PM  % Wound base Black 0% 01/27/2014  1:29 PM  %  Wound base Other (Comment) 0% 01/27/2014  1:29 PM  Peri-wound Assessment Intact;Edema 01/27/2014  1:29 PM  Wound Length (cm) 4.6 cm 01/25/2014  9:45 AM  Wound Width (cm) 4.4 cm 01/25/2014  9:45 AM  Wound Depth (cm) 0.4 cm 01/25/2014  9:45 AM  Margins Unattached edges (unapproximated) 01/27/2014  1:29 PM  Drainage Amount Minimal 01/27/2014  1:29 PM  Drainage Description Serous 01/27/2014  1:29 PM  Treatment Debridement (Selective);Hydrotherapy (Pulse lavage);Packing (Saline gauze) 01/27/2014  1:29 PM     Wound / Incision (Open or Dehisced) 01/25/14 Rt lateral lower leg distal (Active)  Dressing Type Moist to dry;Gauze (Comment); santyl 01/27/2014  1:29 PM  Dressing Changed Changed 01/27/2014  1:29 PM  Dressing Status Clean;Dry;Intact 01/27/2014  1:29 PM  Dressing Change Frequency Daily 01/27/2014  1:29 PM  Site / Wound Assessment Red;Yellow 01/27/2014  1:29 PM  % Wound base Red or Granulating 90% 01/27/2014  1:29 PM  % Wound base Yellow 10% 01/27/2014  1:29 PM  % Wound base Black 0% 01/27/2014  1:29 PM  % Wound base Other (Comment) 0% 01/27/2014  1:29 PM  Peri-wound Assessment Intact;Edema 01/27/2014  1:29 PM  Wound Length (cm) 3.4 cm 01/25/2014  9:45 AM  Wound Width (cm) 3.7 cm 01/25/2014  9:45 AM  Wound Depth (cm) 0.1 cm 01/25/2014  9:45 AM  Margins Unattached edges (unapproximated) 01/27/2014  1:29 PM  Drainage Amount Minimal 01/27/2014  1:29 PM  Drainage Description Serosanguineous 01/27/2014  1:29 PM  Treatment Debridement (Selective);Hydrotherapy (Pulse lavage) 01/27/2014  1:29 PM   Hydrotherapy Pulsed lavage therapy -  wound location: Rt lateral lower leg, 3 wounds Pulsed Lavage with Suction (psi): 8 psi Pulsed Lavage with Suction - Normal Saline Used: 1000 mL Pulsed Lavage Tip: Tip with splash shield Selective Debridement Selective Debridement - Location: Rt lateral lower leg Selective Debridement - Tools Used: Forceps;Scissors Selective Debridement - Tissue Removed: yellow   Wound Assessment and Plan   Wound Therapy - Assess/Plan/Recommendations Wound Therapy - Clinical Statement: Steady progress. Pt seen with Plastic surgeon. Agree pt needs continued hydrotherapy. Hydrotherapy Plan: Debridement;Dressing change;Patient/family education;Pulsatile lavage with suction;Ultrasonic wound therapy _0  KHz (+/- 3 KHz) Wound Therapy - Frequency: 6X / week Wound Therapy - Current Recommendations: Case manager/social work Wound Therapy - Follow Up Recommendations: Other (comment) (LTACH) Wound Plan: see above  Wound Therapy Goals- Improve the function of patient's integumentary system by progressing the wound(s) through the phases of wound healing (inflammation - proliferation - remodeling) by: Decrease Necrotic Tissue to: 75 Decrease Necrotic Tissue - Progress: Progressing toward goal Increase Granulation Tissue to: 25 Increase Granulation Tissue - Progress: Progressing toward goal Improve Drainage Characteristics: Min;Serous Improve Drainage Characteristics - Progress: Met  Goals will be updated until maximal potential achieved or discharge criteria met.  Discharge criteria: when goals achieved, discharge from hospital, MD decision/surgical intervention, no progress towards goals, refusal/missing three consecutive treatments without notification or medical reason.  GP     Phelix Fudala 01/27/2014, 2:15 PM  [place]

## 2014-01-27 NOTE — Progress Notes (Signed)
Seen post hydrotherapy for examination wound. Wife at bedside. She expressed concern about the wound leading to another amputation. Also had questions about appearance of skin, when he will be able to use prosthesis. She confirms the events post cataract surgery that led to ICU stay.  PE  Increasing punctate bleeding over all wounds, no cellulitis, still eschar small amount proximal wound. This area appears full thickness while distally partial thickness.  A/P: Wound improved significantly with hydrotherapy (and dialysis which has improved edema). No current plan for surgical debridement.   If plan is to discharge in next few days, would rec facility that can complete hyrdrotherapy and offer lymphedema management with lymphedema wraps or at least order lymphedema pump for use in facility in addition to elastic compression garments (eg, Tubigrips).  Reviewed that key to halting progression of wound is edema control through garments and in this case dialysis has made a large difference acutely. Reviewed that the induration and hyperkeratotic nature of his skin over foot and left stump is due to edema chronic.   Angel FellowsBrinda Daysy Santini, MD [facility] Plastic & Reconstructive Surgery [phone]

## 2014-01-27 NOTE — Progress Notes (Signed)
Physical Therapy Treatment Patient Details Name: Angel Mays [place] MRN: [id number] DOB: 1971-10-22 Today's Date: 01/27/2014 Time: [id number] PT Time Calculation (min): 15 min  PT Assessment / Plan / Recommendation  History of Present Illness Angel Mays Ihrig is a 43 y.o. male with history of chronic kidney disease, DVT on xarelto, diabetes mellitus, hypertension, gout was brought from the patient's rehabilitation because patient was found to be increasingly confused and febrile illness the patient wife patient has been getting increasingly confused over last few days with recurrent episodes of hypoglycemia. Patient was found to be febrile today in addition to his confusion and was referred to the ER. Patient has over the last 2-3 weeks developed a wound on his right lower extremity which had started having discharge with foul odor   PT Comments   Tolerated up in chair >2 hours.  Needs at least 4 hours for dialysis.  Could have tolerated longer, but did not want to leave pt up for nursing staff to get back to bed.  Will follow acutely for activity tolerance.  Follow Up Recommendations  SNF           Equipment Recommendations  None recommended by PT    Recommendations for Other Services  None  Frequency Min 2X/week   Progress towards PT Goals Progress towards PT goals: Progressing toward goals  Plan Current plan remains appropriate    Precautions / Restrictions Precautions Precautions: Fall Precaution Comments: Left BKA   Pertinent Vitals/Pain Denies pain    Mobility  Bed Mobility Overal bed mobility: Needs Assistance Bed Mobility: Sit to Supine Sit to supine: Min guard General bed mobility comments: heavy use of rail and assist for safety Transfers Overall transfer level: Needs assistance Equipment used:  (transfer board) Transfers: Lateral/Scoot Transfers  Lateral/Scoot Transfers: +2 safety/equipment;Min assist General transfer comment: recliner to bed with air deflated from bed min  assist for safety +1 to guard chair    Exercises General Exercises - Lower Extremity Heel Slides: AROM;AAROM;Both;5 reps;Supine   PT Diagnosis: Generalized weakness  PT Problem List: Decreased strength;Decreased mobility;Decreased activity tolerance;Decreased knowledge of use of DME PT Treatment Interventions: DME instruction;Therapeutic exercise;Wheelchair mobility training;Functional mobility training;Therapeutic activities;Patient/family education   PT Goals (current goals can now be found in the care plan section) Acute Rehab PT Goals Patient Stated Goal: To return to rehab PT Goal Formulation: With patient Time For Goal Achievement: 02/10/14 Potential to Achieve Goals: Good  Visit Information  Last PT Received On: 01/27/14 Assistance Needed: +2 History of Present Illness: Angel Mays Schlageter is a 43 y.o. male with history of chronic kidney disease, DVT on xarelto, diabetes mellitus, hypertension, gout was brought from the patient's rehabilitation because patient was found to be increasingly confused and febrile illness the patient wife patient has been getting increasingly confused over last few days with recurrent episodes of hypoglycemia. Patient was found to be febrile today in addition to his confusion and was referred to the ER. Patient has over the last 2-3 weeks developed a wound on his right lower extremity which had started having discharge with foul odor    Subjective Data  Patient Stated Goal: To return to rehab   Cognition  Cognition Arousal/Alertness: Awake/alert Behavior During Therapy: Flat affect Overall Cognitive Status: Within Functional Limits for tasks assessed    Balance  Balance Overall balance assessment: Needs assistance Sitting-balance support: No upper extremity supported Sitting balance-Leahy Scale: Good Sitting balance - Comments: leans left and right with assist for moving pad under patient, able to recover to upright  with minguard, static sitting  independent  End of Session PT - End of Session Equipment Utilized During Treatment: Gait belt Activity Tolerance: Patient tolerated treatment well Patient left: in bed;with call bell/phone within reach;with family/visitor present   GP     [facility] 01/27/2014, 5:12 PM Sheran Lawless, PT [phone] 01/27/2014

## 2014-01-27 NOTE — Progress Notes (Signed)
Pt Receiving hydro therapy at bedside to right leg

## 2014-01-27 NOTE — Progress Notes (Signed)
S: no new CO O:BP 154/61  Pulse 82  Temp(Src) 99 F (37.2 C) (Oral)  Resp 18  Ht 6\' 1"  (1.854 m)  Wt 188 kg (414 lb 7.4 oz)  BMI 54.69 kg/m2  SpO2 99%  Intake/Output Summary (Last 24 hours) at 01/27/14 1024 Last data filed at 01/27/14 0220  Gross per 24 hour  Intake    240 ml  Output   4406 ml  Net  -4166 ml   Weight change: -10 kg (-22 lb 0.7 oz) AVW:UJWJXGen:awake and alert CVS:RRR Resp:Clear ant Abd:+ BS NTND Ext:+ edema on Rt.  Bandage Rt LE. NEURO:Ox3 no asterixis   . acetaminophen  650 mg Oral Once  . allopurinol  100 mg Oral Daily  . amLODipine  10 mg Oral Daily  . apixaban  5 mg Oral BID  . b complex-vitamin c-folic acid  1 tablet Oral QHS  . carvedilol  25 mg Oral BID WC  . cholecalciferol  5,000 Units Oral Once per day on Mon Tue Wed Thu Fri  . clindamycin (CLEOCIN) IV  300 mg Intravenous 3 times per day  . cloNIDine  0.2 mg Oral BID  . collagenase   Topical Daily  . darbepoetin (ARANESP) injection - DIALYSIS  100 mcg Intravenous Q Tue-HD  . feeding supplement (NEPRO CARB STEADY)  237 mL Oral TID BM  . feeding supplement (PRO-STAT SUGAR FREE 64)  30 mL Oral BID  . fluticasone  2 spray Each Nare Daily  . heparin  3,000 Units Intracatheter Once  . hydrALAZINE  100 mg Oral TID  . insulin aspart  0-9 Units Subcutaneous TID WC  . loratadine  10 mg Oral Daily  . metoCLOPramide  5 mg Oral TID AC  . pantoprazole  40 mg Oral Daily  . pentoxifylline  400 mg Oral TID WC  . prednisoLONE acetate  1 drop Left Eye QID  . sevelamer carbonate  800 mg Oral TID WC  . sodium chloride  3 mL Intravenous Q12H   No results found. BMET    Component Value Date/Time   NA 143 01/26/2014 0815   K 3.2* 01/26/2014 0815   CL 105 01/26/2014 0815   CO2 21 01/26/2014 0815   GLUCOSE 148* 01/26/2014 0815   BUN 30* 01/26/2014 0815   CREATININE 2.27* 01/26/2014 0815   CALCIUM 8.3* 01/26/2014 0815   GFRNONAA 33* 01/26/2014 0815   GFRAA 39* 01/26/2014 0815   CBC    Component Value Date/Time   WBC  8.0 01/26/2014 0815   RBC 2.87* 01/26/2014 0815   RBC 2.70* 01/22/2014 2130   HGB 8.0* 01/26/2014 0815   HCT 23.9* 01/26/2014 0815   PLT 181 01/26/2014 0815   MCV 83.3 01/26/2014 0815   MCH 27.9 01/26/2014 0815   MCHC 33.5 01/26/2014 0815   RDW 16.2* 01/26/2014 0815   LYMPHSABS 1.0 01/22/2014 0800   MONOABS 0.8 01/22/2014 0800   EOSABS 0.1 01/22/2014 0800   BASOSABS 0.1 01/22/2014 0800     Assessment: 1. ESRD.  He has good UO and Scr 2.27 pre HD yest 2. Anemia on aranesp 3. DM 4. Decubitus ulcers  Plan: 1 Recheck Scr in AM.  If has increased significantly, then will plan HD.  Will need permcath if needs continued HD 2. Await PTH 3. DC renvela as PO4 2.7  Cynde Menard T

## 2014-01-27 NOTE — Evaluation (Signed)
Physical Therapy Evaluation Patient Details Name: Angel Mays MRN: [id number] DOB: [birth year] Today's Date: 01/27/2014 Time: [id number] PT Time Calculation (min): 27 min  PT Assessment / Plan / Recommendation History of Present Illness  Angel Mays is a 43 y.o. male with history of chronic kidney disease, DVT on xarelto, diabetes mellitus, hypertension, gout was brought from the patient's rehabilitation because patient was found to be increasingly confused and febrile illness the patient wife patient has been getting increasingly confused over last few days with recurrent episodes of hypoglycemia. Patient was found to be febrile today in addition to his confusion and was referred to the ER. Patient has over the last 2-3 weeks developed a wound on his right lower extremity which had started having discharge with foul odor  Clinical Impression  Patient presents with decreased mobility due to deficits listed below (PT problem list).  He will benefit from skilled PT in the acute setting to allow decreased burden of care in next venue.  May need LTACH for wound care depending oon progress during acute stay.    PT Assessment  Patient needs continued PT services    Follow Up Recommendations  SNF    Does the patient have the potential to tolerate intense rehabilitation    N/A  Barriers to Discharge  needs to sit in chair for dialysis      Equipment Recommendations  None recommended by PT    Recommendations for Other Services     Frequency Min 2X/week    Precautions / Restrictions Precautions Precautions: Fall Precaution Comments: Left BKA   Pertinent Vitals/Pain No pain complaints      Mobility  Bed Mobility Overal bed mobility: Needs Assistance Bed Mobility: Rolling;Supine to Sit Rolling: Min assist Supine to sit: Min assist General bed mobility comments: heavy use of rail and assist for safety Transfers Overall transfer level: Needs assistance Equipment used:  (transfer  board) Transfers: Lateral/Scoot Transfers  Lateral/Scoot Transfers: +2 safety/equipment;Min assist General transfer comment: bed to recliner (higher to lower surface) with min assist for safety +2 to keep chair close    Exercises General Exercises - Lower Extremity Heel Slides: AROM;AAROM;Both;5 reps;Supine   PT Diagnosis: Generalized weakness  PT Problem List: Decreased strength;Decreased mobility;Decreased activity tolerance;Decreased knowledge of use of DME PT Treatment Interventions: DME instruction;Therapeutic exercise;Wheelchair mobility training;Functional mobility training;Therapeutic activities;Patient/family education     PT Goals(Current goals can be found in the care plan section) Acute Rehab PT Goals Patient Stated Goal: To return to rehab PT Goal Formulation: With patient Time For Goal Achievement: 02/10/14 Potential to Achieve Goals: Good  Visit Information  Last PT Received On: 01/27/14 Assistance Needed: +2 History of Present Illness: Angel Mays is a 43 y.o. male with history of chronic kidney disease, DVT on xarelto, diabetes mellitus, hypertension, gout was brought from the patient's rehabilitation because patient was found to be increasingly confused and febrile illness the patient wife patient has been getting increasingly confused over last few days with recurrent episodes of hypoglycemia. Patient was found to be febrile today in addition to his confusion and was referred to the ER. Patient has over the last 2-3 weeks developed a wound on his right lower extremity which had started having discharge with foul odor       Prior Functioning  Home Living Family/patient expects to be discharged to:: Skilled nursing facility Prior Function Level of Independence: Needs assistance Gait / Transfers Assistance Needed: sliding board transfers Communication Communication: No difficulties    Cognition  Cognition Arousal/Alertness: Awake/alert  Behavior During Therapy:  Flat affect Overall Cognitive Status: Within Functional Limits for tasks assessed    Extremity/Trunk Assessment Upper Extremity Assessment Upper Extremity Assessment: Overall WFL for tasks assessed Lower Extremity Assessment Lower Extremity Assessment: Generalized weakness   Balance Balance Overall balance assessment: Needs assistance Sitting-balance support: No upper extremity supported Sitting balance-Leahy Scale: Good Sitting balance - Comments: leans left and right with assist for moving pad under patient, able to recover to upright with minguard, static sitting independent  End of Session PT - End of Session Equipment Utilized During Treatment: Gait belt Activity Tolerance: Patient limited by fatigue Patient left: in chair;with call bell/phone within reach  GP     [facility] 01/27/2014, 4:17 PM  [place], PT [phone] 01/27/2014

## 2014-01-27 NOTE — Progress Notes (Signed)
ANTIBIOTIC AND ANTICOAGULATION CONSULT NOTE - FOLLOW UP  Pharmacy Consult for Vancomycin, Zosyn Indication: RLE cellulitis   Pharmacy Consult for Eliquis  Indication: H/o DVT on HD    Allergies  Allergen Reactions  . Omnipaque [Iohexol]     Patient Measurements: Height: 6\' 1"  (185.4 cm) Weight: 414 lb 7.4 oz (188 kg) IBW/kg (Calculated) : 79.9 Adjusted Body Weight: n/a  Vital Signs: Temp: 99 F (37.2 C) (03/11 0815) Temp src: Oral (03/11 0815) BP: 154/61 mmHg (03/11 0815) Pulse Rate: 82 (03/11 0815) Intake/Output from previous day: 03/10 0701 - 03/11 0700 In: 240 [P.O.:240] Out: 4406 [Urine:1500] Intake/Output from this shift:    Labs:  Recent Labs  01/25/14 2253 01/26/14 0815  WBC 8.0 8.0  HGB 8.2* 8.0*  PLT 186 181  CREATININE 2.41* 2.27*   Estimated Creatinine Clearance: 72.3 ml/min (by C-G formula based on Cr of 2.27). No results found for this basename: Rolm GalaVANCOTROUGH, VANCOPEAK, VANCORANDOM, GENTTROUGH, GENTPEAK, GENTRANDOM, TOBRATROUGH, TOBRAPEAK, TOBRARND, AMIKACINPEAK, AMIKACINTROU, AMIKACIN,  in the last 72 hours    Assessment: 43 y/o with h/o DVT, Xarelto PTA admitted 01/21/2014 with confusion and fever.   AC: Pharmacy dosing patient's Eliquis (changed from Xarelto due to pt starting HD). Hgb remains stable at 8 and Plt are wnl. He was educated on Eliquis and no s/s of bleeding noted.   ID: He is on D#5 of Zosyn and vancomycin for RLE cellulitis. WBC is currently wnl and blood cultures remain negative. He received HD yesterday but permanent HD schedule yet to be determined.    Goal of Therapy:  Pre-HD vancomycin leve 15-25 Eradication of infection  Plan:  1. Vancomycin 1000 mg IV Q HD. Likely HD tomorrow  2. Follow up plan for HD + Abx before implementing standing post HD regimen 3. Zosyn 2.25 gm IV Q 8 hours  4. Vanc pre-HD random tom AM  5. Continue Eliquis 5 mg twice daily  6. Monitor CBC and s/s of bleeding     Vinnie LevelBenjamin Anais Denslow, PharmD.   Clinical Pharmacist Pager [phone]

## 2014-01-27 NOTE — Progress Notes (Signed)
NUTRITION FOLLOW-UP  DOCUMENTATION CODES Per approved criteria  -Morbid Obesity -Severe malnutrition in the context of acute illness   INTERVENTION: Continue Nepro Shake po TID, each supplement provides 425 kcal and 19 grams protein. Continue 30 ml Prostat po BID, each supplement provides 100 kcal and 15 grams protein. Consider diet liberalization if oral intake does not improve. Discussed with Dr. Mercy Moore. Initiate 48-hour calorie count. Discussed with Dr. Sherral Hammers. RD to continue to follow nutrition care plan.  NUTRITION DIAGNOSIS: Inadequate oral intake related to poor appetite as evidenced by poor meal completion. Ongoing.  Goal: Intake to meet >90% of estimated nutrition needs. Unmet.  Monitor:  weight trends, lab trends, I/O's, PO intake, supplement tolerance  ASSESSMENT: PMHx significant for CKD, L BKA, DVT, DM, HTN, gout. Admitted with increased confusion, hypoglycemia and fever. Pt also with wound on RLE that has been worsening x 2-3 weeks. Admitted to [facility] month 2/2 flu and PNA. Work-up reveals uremia.  Underwent first HD treatment 3/7.  Continues to receive HD on a regular basis. RD consulted today for new HD education. RD had extensive discussion regarding nutrition with patient, wife, and dtr. Noted that wife is very concerned regarding patient's nutritional status. Pt is taking in only 1-2 cans Nepro daily. We reviewed diet education for renal and DM nutrition therapy, RD provided Choose A Meal Booklet. Wife is agreeable to calorie count, as she is also interested in short-term artificial nutrition. RD asked Dr. Mercy Moore if patient's diet could be liberalized. Dr. Mercy Moore reported that pt needs to maintain his renal restrictions, regardless of his intake. This RD also discussed loss of appetite with attending, Dr. Sherral Hammers. Per Dr. Sherral Hammers, the patient's nutritional issues are not a primary concern at this time. Discussed option of doing calorie count with Dr. Sherral Hammers, to  which he was agreeable. Pt asking for chocolate Ensure or Glucerna, however RD will not provide, given Dr. Etheleen Nicks request to continue renal restrictions. Discussed calorie count with patient, wife, dtr and Therapist, sports. RD hung up envelope for information to be collected. Pt stating, "I feel like I'm being threatened to eat with this calorie count." Pt appears to be very reluctant to the idea of short-term artificial nutrition.  WOC RN evaluated pt on 3/6 - pt with necrotic RLE lateral leg wounds with sDTI to R posterior thigh. VVS is not recommending any further vascular interventions. Pt will need debridement to have any changes of healing "if at all." Pt now receiving hydrotherapy.  Currently eating 0-25% of meals. Pt meets criteria for severe MALNUTRITION in the context of acute illness as evidenced by 7.5% wt loss x 1 week and intake of <50% x at least 5 days.  CBG's: 178,193, 174 Potassium remains low at 3.2 Phosphorus WNL Meds reviewed: renal vite, vitamin D, renvela  Height: Ht Readings from Last 1 Encounters:  01/24/14 6' 1"  (1.854 m)    Weight: Wt Readings from Last 1 Encounters:  01/26/14 414 lb 7.4 oz (188 kg)  Admit 447 lb - wt loss likely 2/2 fluid status and inadequate nutrition  BMI: 58.3 (adjusted for BKA) Obese Class III  Estimated Nutritional Needs: Kcal: 2200 - 2500 kcal Protein: 125 - 145 Fluid: 1.2 liters  Skin:  sDTI to R thigh Stage I to L and R buttocks   Diet Order: Diabetic and Renal diet  EDUCATION NEEDS: -Education needs met 3/11   Intake/Output Summary (Last 24 hours) at 01/27/14 1556 Last data filed at 01/27/14 1300  Gross per 24 hour  Intake    120 ml  Output   1000 ml  Net   -880 ml    Last BM: 3/9  Labs:   Recent Labs Lab 01/23/14 0910 01/25/14 2253 01/26/14 0815 01/26/14 0821  NA 141 143 143  --   K 3.3* 3.1* 3.2*  --   CL 105 105 105  --   CO2 17* 20 21  --   BUN 54* 33* 30*  --   CREATININE 3.17* 2.41* 2.27*  --   CALCIUM  8.4 8.5 8.3* 7.6*  MG  --  1.7  --   --   PHOS 4.0 2.4 2.7  --   GLUCOSE 83 155* 148*  --     CBG (last 3)   Recent Labs  01/26/14 2331 01/27/14 0814 01/27/14 1122  GLUCAP 174* 193* 178*    Scheduled Meds: . acetaminophen  650 mg Oral Once  . allopurinol  100 mg Oral Daily  . amLODipine  10 mg Oral Daily  . apixaban  5 mg Oral BID  . b complex-vitamin c-folic acid  1 tablet Oral QHS  . carvedilol  25 mg Oral BID WC  . cholecalciferol  5,000 Units Oral Once per day on Mon Tue Wed Thu Fri  . clindamycin (CLEOCIN) IV  300 mg Intravenous 3 times per day  . cloNIDine  0.2 mg Oral BID  . collagenase   Topical Daily  . darbepoetin (ARANESP) injection - DIALYSIS  100 mcg Intravenous Q Tue-HD  . feeding supplement (NEPRO CARB STEADY)  237 mL Oral TID BM  . feeding supplement (PRO-STAT SUGAR FREE 64)  30 mL Oral BID  . fluticasone  2 spray Each Nare Daily  . heparin  3,000 Units Intracatheter Once  . hydrALAZINE  100 mg Oral TID  . insulin aspart  0-9 Units Subcutaneous TID WC  . loratadine  10 mg Oral Daily  . metoCLOPramide  5 mg Oral TID AC  . pantoprazole  40 mg Oral Daily  . pentoxifylline  400 mg Oral TID WC  . prednisoLONE acetate  1 drop Left Eye QID  . sodium chloride  3 mL Intravenous Q12H    Continuous Infusions:   Inda Coke MS, RD, LDN Inpatient Registered Dietitian Pager: [phone] After-hours pager: [phone]

## 2014-01-27 NOTE — Clinical Social Work Note (Addendum)
CSW talked with patient's wife Angel Mays, daughter [name] and patient about discharge plans, her concerns about his current nursing facility being able to meet his needs and possible finding another facility that could better meet his medical needs. CSW talked with patient and he was able to respond appropriately to CSW's questions. Patient informed CSW that he did sit up today in the chair for approx 2 1/2 hrs.  Mrs. [name] was given bed offers to date and will follow-up with some of the facilities that responded considering. [place]  [name], MSW, LCSW [phone]

## 2014-01-28 ENCOUNTER — Inpatient Hospital Stay
Admission: AD | Admit: 2014-01-28 | Discharge: 2014-02-22 | Disposition: A | Discharge status: Skilled Nursing Facility | Payer: Self-pay | Source: Ambulatory Visit | Attending: Internal Medicine | Admitting: Internal Medicine

## 2014-01-28 DIAGNOSIS — L089 Local infection of the skin and subcutaneous tissue, unspecified: Secondary | ICD-10-CM

## 2014-01-28 DIAGNOSIS — T148XXA Other injury of unspecified body region, initial encounter: Secondary | ICD-10-CM

## 2014-01-28 LAB — RENAL FUNCTION PANEL
Albumin: 2.6 g/dL — ABNORMAL LOW (ref 3.5–5.2)
BUN: 22 mg/dL (ref 6–23)
CALCIUM: 8.6 mg/dL (ref 8.4–10.5)
CO2: 21 mEq/L (ref 19–32)
Chloride: 100 mEq/L (ref 96–112)
Creatinine, Ser: 2.11 mg/dL — ABNORMAL HIGH (ref 0.50–1.35)
GFR calc Af Amer: 42 mL/min — ABNORMAL LOW (ref >90)
GFR calc non Af Amer: 36 mL/min — ABNORMAL LOW (ref >90)
GLUCOSE: 213 mg/dL — AB (ref 70–99)
PHOSPHORUS: 2.1 mg/dL — AB (ref 2.3–4.6)
POTASSIUM: 3.2 meq/L — AB (ref 3.7–5.3)
Sodium: 137 mEq/L (ref 137–147)

## 2014-01-28 LAB — CULTURE, BLOOD (ROUTINE X 2)
CULTURE: NO GROWTH
Culture: NO GROWTH

## 2014-01-28 LAB — VANCOMYCIN, RANDOM: VANCOMYCIN RM: 7.8 ug/mL

## 2014-01-28 LAB — GLUCOSE, CAPILLARY
GLUCOSE-CAPILLARY: 204 mg/dL — AB (ref 70–99)
Glucose-Capillary: 198 mg/dL — ABNORMAL HIGH (ref 70–99)
Glucose-Capillary: 186 mg/dL — ABNORMAL HIGH (ref 70–99)

## 2014-01-28 MED ORDER — VANCOMYCIN HCL 1000 MG IV SOLR
1500.0000 mg | INTRAVENOUS | Status: DC
  Filled 2014-01-28: qty 1500

## 2014-01-28 MED ORDER — ALBUTEROL SULFATE (2.5 MG/3ML) 0.083% IN NEBU: 2.5000 mg | INHALATION_SOLUTION | Freq: Four times a day (QID) | RESPIRATORY_TRACT | Status: DC | PRN

## 2014-01-28 MED ORDER — CLINDAMYCIN HCL 300 MG PO CAPS: 300.0000 mg | ORAL_CAPSULE | Freq: Three times a day (TID) | ORAL | Status: DC

## 2014-01-28 MED ORDER — LIDOCAINE HCL (PF) 1 % IJ SOLN: 5.0000 mL | INTRAMUSCULAR | Status: DC | PRN

## 2014-01-28 MED ORDER — LIDOCAINE-PRILOCAINE 2.5-2.5 % EX CREA: 1.0000 "application " | TOPICAL_CREAM | CUTANEOUS | Status: DC | PRN

## 2014-01-28 MED ORDER — PRO-STAT SUGAR FREE PO LIQD: 30.0000 mL | Freq: Two times a day (BID) | ORAL | Status: DC

## 2014-01-28 MED ORDER — COLLAGENASE 250 UNIT/GM EX OINT: TOPICAL_OINTMENT | Freq: Every day | CUTANEOUS | Status: DC

## 2014-01-28 MED ORDER — METOCLOPRAMIDE HCL 5 MG PO TABS: 5.0000 mg | ORAL_TABLET | Freq: Three times a day (TID) | ORAL | Status: DC

## 2014-01-28 MED ORDER — VANCOMYCIN HCL 10 G IV SOLR
1500.0000 mg | INTRAVENOUS | Status: DC
  Administered 2014-01-28: 1500 mg via INTRAVENOUS
  Filled 2014-01-28: qty 1500

## 2014-01-28 MED ORDER — PENTAFLUOROPROP-TETRAFLUOROETH EX AERO: 1.0000 "application " | INHALATION_SPRAY | CUTANEOUS | Status: DC | PRN

## 2014-01-28 MED ORDER — NEPRO/CARBSTEADY PO LIQD: 237.0000 mL | Freq: Three times a day (TID) | ORAL | Status: DC

## 2014-01-28 MED ORDER — POTASSIUM CHLORIDE 20 MEQ/15ML (10%) PO LIQD
40.0000 meq | Freq: Once | ORAL | Status: AC
  Administered 2014-01-28: 40 meq via ORAL
  Filled 2014-01-28: qty 30

## 2014-01-28 MED ORDER — PANTOPRAZOLE SODIUM 40 MG PO TBEC: 40.0000 mg | DELAYED_RELEASE_TABLET | Freq: Every day | ORAL | Status: DC

## 2014-01-28 MED ORDER — APIXABAN 5 MG PO TABS: 5.0000 mg | ORAL_TABLET | Freq: Two times a day (BID) | ORAL | Status: DC

## 2014-01-28 MED ORDER — INSULIN ASPART 100 UNIT/ML ~~LOC~~ SOLN: 0.0000 [IU] | Freq: Three times a day (TID) | SUBCUTANEOUS | Status: DC

## 2014-01-28 MED ORDER — HEPARIN SODIUM (PORCINE) 1000 UNIT/ML DIALYSIS: 20.0000 [IU]/kg | INTRAMUSCULAR | Status: DC | PRN

## 2014-01-28 MED ORDER — OXYCODONE HCL 10 MG PO TABS: 10.0000 mg | ORAL_TABLET | Freq: Four times a day (QID) | ORAL | Status: DC | PRN

## 2014-01-28 NOTE — Progress Notes (Signed)
Physical Therapy Wound Treatment Patient Details  Name: Angel Mays MRN: [id number] Date of Birth: 18-Nov-1971  Today's Date: 01/28/2014 Time: 9629-5284 Time Calculation (min): 33 min  Subjective  Date of Onset:  (unknown (~3 weeks PTA per pt)) Prior Treatments: unknown  Pain Score: Pain Score: 9/10 after hydrotherapy treatment; pre-medicated for pain  Wound Assessment  Wound / Incision (Open or Dehisced) 01/22/14 Other (Comment) Leg Right;Proximal;Lateral;Lower (Active)  Dressing Type Moist to dry;Gauze (Comment) 01/28/2014 10:00 AM  Dressing Changed New 01/28/2014 10:00 AM  Dressing Status Clean;Dry;Intact 01/28/2014 10:00 AM  Dressing Change Frequency Daily 01/28/2014  9:28 AM  Site / Wound Assessment Yellow;Pink;Red;Granulation tissue;Painful;Pale 01/28/2014  9:28 AM  % Wound base Red or Granulating 10% 01/28/2014  9:28 AM  % Wound base Yellow 90% 01/28/2014  9:28 AM  % Wound base Black 0% 01/28/2014  9:28 AM  Peri-wound Assessment Intact;Edema 01/28/2014  9:28 AM  Wound Length (cm) 10.8 cm 01/25/2014  9:45 AM  Wound Width (cm) 5 cm 01/25/2014  9:45 AM  Wound Depth (cm) 0.3 cm 01/25/2014  9:45 AM  Margins Unattached edges (unapproximated) 01/28/2014  9:28 AM  Closure None 01/28/2014  9:28 AM  Drainage Amount Minimal 01/28/2014  9:28 AM  Drainage Description Serous 01/28/2014  9:28 AM  Treatment Debridement (Selective);Hydrotherapy (Pulse lavage) 01/28/2014  9:28 AM     Wound / Incision (Open or Dehisced) 01/25/14 Rt lateral lower leg middle wound (Active)  Dressing Type Moist to dry;Gauze (Comment) 01/28/2014  9:28 AM  Dressing Changed Changed 01/28/2014  9:28 AM  Dressing Status Clean;Dry;Intact 01/28/2014  9:28 AM  Dressing Change Frequency Daily 01/28/2014  9:28 AM  Site / Wound Assessment Yellow;Pale;Pink 01/28/2014  9:28 AM  % Wound base Red or Granulating 40% 01/28/2014  9:28 AM  % Wound base Yellow 60% 01/28/2014  9:28 AM  % Wound base Black 0% 01/28/2014  9:28 AM  % Wound base Other  (Comment) 0% 01/28/2014  9:28 AM  Peri-wound Assessment Intact;Edema 01/28/2014  9:28 AM  Wound Length (cm) 4.6 cm 01/25/2014  9:45 AM  Wound Width (cm) 4.4 cm 01/25/2014  9:45 AM  Wound Depth (cm) 0.4 cm 01/25/2014  9:45 AM  Margins Unattached edges (unapproximated) 01/28/2014  9:28 AM  Drainage Amount Minimal 01/28/2014  9:28 AM  Drainage Description Serous 01/28/2014  9:28 AM  Treatment Debridement (Selective);Hydrotherapy (Pulse lavage) 01/28/2014  9:28 AM  Note: Right lateral distal wound is now epithelialized/healed. Documented in wound care flowsheet and completed wound.  Hydrotherapy Pulsed lavage therapy - wound location: Rt lateral lower leg, 3 wounds Pulsed Lavage with Suction (psi): 12 psi Pulsed Lavage with Suction - Normal Saline Used: 1000 mL Pulsed Lavage Tip: Tip with splash shield Selective Debridement Selective Debridement - Location: Rt lateral lower leg Selective Debridement - Tools Used: Forceps;Scissors;Scalpel Selective Debridement - Tissue Removed: yellow   Wound Assessment and Plan  Wound Therapy - Assess/Plan/Recommendations Wound Therapy - Clinical Statement: Steady progress. Most distal wound epithelialized/healed. Wound Therapy - Functional Problem List: integumentary defect puts pt at continued incr risk of infection Factors Delaying/Impairing Wound Healing: Altered sensation;Diabetes Mellitus;Infection - systemic/local;Immobility;Multiple medical problems Hydrotherapy Plan: Debridement;Dressing change;Patient/family education;Pulsatile lavage with suction;Ultrasonic wound therapy _0  KHz (+/- 3 KHz) Wound Therapy - Frequency: 6X / week Wound Therapy - Current Recommendations: Case manager/social work Wound Therapy - Follow Up Recommendations: Other (comment) (LTACH) Wound Plan: see above  Wound Therapy Goals- Improve the function of patient's integumentary system by progressing the wound(s) through the phases of wound healing (inflammation -  proliferation -  remodeling) by: Decrease Necrotic Tissue to: 75 Decrease Necrotic Tissue - Progress: Progressing toward goal Increase Granulation Tissue to: 25 Increase Granulation Tissue - Progress: Progressing toward goal Improve Drainage Characteristics: Min;Serous Improve Drainage Characteristics - Progress: Met  Goals will be updated until maximal potential achieved or discharge criteria met.  Discharge criteria: when goals achieved, discharge from hospital, MD decision/surgical intervention, no progress towards goals, refusal/missing three consecutive treatments without notification or medical reason.  GP     Spiro Ausborn 01/28/2014, 11:07 AM Pager [phone]

## 2014-01-28 NOTE — Progress Notes (Signed)
[facility] Report called to Jomarie LongsJoseph, RN. Patient remains stable; no signs or symptoms of distress. Patient transported with Right anterior FA peripheral IV in place and belongings at his side.

## 2014-01-28 NOTE — Progress Notes (Signed)
ANTIBIOTIC CONSULT NOTE - FOLLOW UP  Pharmacy Consult for Vancomycin Indication: RLE cellulitis   Allergies  Allergen Reactions  . Omnipaque [Iohexol]     Patient Measurements: Height: 6\' 1"  (185.4 cm) Weight: 414 lb 7.4 oz (188 kg) IBW/kg (Calculated) : 79.9 Adjusted Body Weight: n/a  Vital Signs: Temp: 98.2 F (36.8 C) (03/12 0949) Temp src: Oral (03/12 0949) BP: 109/63 mmHg (03/12 0949) Pulse Rate: 100 (03/12 0949) Intake/Output from previous day: 03/11 0701 - 03/12 0700 In: 360 [P.O.:360] Out: 1125 [Urine:1125] Intake/Output from this shift: Total I/O In: 540 [P.O.:240; Other:300] Out: 500 [Urine:500]  Labs:  Recent Labs  01/25/14 2253 01/26/14 0815 01/28/14 0425  WBC 8.0 8.0  --   HGB 8.2* 8.0*  --   PLT 186 181  --   CREATININE 2.41* 2.27* 2.11*   Estimated Creatinine Clearance: 77.8 ml/min (by C-G formula based on Cr of 2.11).  Recent Labs  01/28/14 0425  VANCORANDOM 7.8      Assessment: 43 y/o with h/o DVT, Xarelto PTA admitted 01/21/2014 with confusion and fever. He is on D#6 of Zosyn and vancomycin for RLE cellulitis. WBC is currently wnl and blood cultures remain negative. Per nephrology, not convinced of ESRD since SCr is 2.1 and pt has good UOP. Planning to hold off on HD and monitor SCr. A vancomycin random level drawn today resulted at 7.8 which is subtherapeutic despite getting Vanc 1 gm every day since 3/6 except on 3/9. Will start patient on scheduled vancomycin and increase dose.    3/6 zosyn >>3/10 3/6 vanc (1500 mg + 1000mg  load given 3/6) 3/10 Clindamycin >>   3/6 blood x2 ngtd   Goal of Therapy:  Vanc goal 10-15  Eradication of infection  Plan:  1. Start Vancomycin 1500 mg IV Q 24 hours. Monitor SCr closely  2. Follow up plan for HD  3. Clindamycin 300 mg three times a day per MD  4. Monitor cultures, renal fx and patient clinical progress      Vinnie LevelBenjamin Lucile Didonato, PharmD.  Clinical Pharmacist Pager [phone]

## 2014-01-28 NOTE — Discharge Summary (Signed)
Physician Discharge Summary  Angel Mays FOY:[id number] DOB: June 18, 1971 DOA: 01/21/2014  PCP: [name], MD  Admit date: 01/21/2014 Discharge date: 01/28/2014  Time spent: 40 minutes  Recommendations for Outpatient Follow-up:  Acute toxic metabolic encephalopathy  -secondary to uremia-resolved   Acute metabolic acidosis; secondary to cellulitis?  -, multifactorial, DDX = sepsis + Impending uremia -Unlikely Salicyclate tox -Patient initially started on vancomycin/Zosyn, st dialysis = 3/7-much more oriented now-was on Vancomycin/Zosyn x4 days, then started on clindamycin. -Discharge on clindamycin stop date 3/14   Nausea  -potentially related to uremia vs diabetic gastroparesis  -Continue Reglan 5 tid ac    ESRD  -Tunneled HD cath placed 3/6-defer to nephrology regarding continued dialysis need. - Per Dr. Fleet Contras (nephrology) note from 3/12 UO good. Thus I am not convinced yet that he is ESRD. Dr. Mercy Moore to contact nephrologist at [facility]  Probable diabetic PVD/Lymphedema wound right lower extremity   -patient may eventually end up with another amputation unfortunately -LTAC we'll continue wound care; -Plastic surgery/vascular surgery I both evaluated wounds and are currently happy with the healing process.   DVT  -, status post filter placement  -since no imminent surgical procedure Xarelto-->apixaban.   Diabetes mellitus  -blood sugars -138-136. Not eating.  -Nutrition performing calorie count; will have to be completed at the LTAC   Hypertension -Within AHA/ADA guidelines  -continue amlodipine 10 daily, Coreg 25 twice a day, clonidine 0.2 twice a day, hydralazine 100 3 times a day.   Metabolic bone disease  -defer to nephrology  Anemia of renal disease, possible nutritional component -Continue PO iron supplements .   Disability  -[facility] has [facility]  Right hand pain  -X-ray shows no acute fracture; see results below    Discharge  Diagnoses:  Principal Problem:   Acute encephalopathy Active Problems:   Metabolic acidosis   CKD (chronic kidney disease)   Leg ulcer   Diabetes mellitus   Anemia   Gout   HTN (hypertension)   History of DVT (deep vein thrombosis)   Poor venous access   Discharge Condition: Stable  Diet recommendation: Diabetic diet Filed Weights   01/26/14 0526 01/26/14 0802 01/26/14 1214  Weight: 200 kg (440 lb 14.7 oz) 190 kg (418 lb 14 oz) 188 kg (414 lb 7.4 oz)    History of present illness:  43 y/o BM PMHx S/P Lt BKA jul 2013-performed at [facility] [reconstructive surgery]-->Transferred to [facility] since September 2013, PMHx right leg ulcer, HTN, Hx DVT (on chronic anticoagulation currently Apixaban was on Xarelto prior to admission; started 1999)/IVC placed January 2012, diabetes mellitus type 2, anemia, acute encephalopathy CKD 4-5, Calculated eGFR ~15, Super Morbid obesity BMI 59.03 kg/(m^2). Known Diabetic foot ulcers Rt side followed by wound care, admitted early 01/22/2014 with increasing confusion in setting of hypoglycemia, and foul odour from RLE wound  Wound first noticed ~ February mid 2015. His R leg has been "dark " for several months. He was seen by a wound nurse for this.  He also has wounds on his bottom which have been persistent since Jan 2015 since ICU [facility] for 2 weeks for PNA and h. flu-Went in for routine cataract surgery-Had Htn crisis and noted LVH severe then He has not really been himself as usual Supposed to have dialysis shunt placed as an outpatient couple 02/04/14-Dr. [name] is as primary nephrologist . 3/11 patient complains of right hand pain which has worsened over last couple of weeks. 3/12 patient alert and  oriented and looking forward to transferring to LTAC    Consultants:  Dr. Fleet Contras (nephrology) Dr Irene Limbo (plastic surgery)   Procedures:  Right hand x-ray 01/27/2014 There is a fracture at the base of the  fifth proximal phalanx which  appears chronic. The margins are well corticated. Correlate with  history.  Negative for acute fracture. No significant arthropathy.  Tunneled HD cath placed 3/6   Antibiotics:  Clindamycin 3/10>> stop date 3/14    Discharge Exam: Filed Vitals:   01/27/14 2100 01/28/14 0500 01/28/14 0935 01/28/14 0949  BP: 139/72 153/66 119/66 109/63  Pulse: 85 79 81 100  Temp: 98.9 F (37.2 C) 98.6 F (37 C) 98.2 F (36.8 C) 98.2 F (36.8 C)  TempSrc: Oral Oral Oral Oral  Resp: 18 18 19 23   Height:      Weight:      SpO2: 97% 100% 98% 99%   General: A./O. x4, NAD  Cardiovascular: Regular rhythm and rate, negative murmurs rubs gallops, right DP/PT pulse one plus  Respiratory: Clear to auscultation bilateral  Abdomen: Morbidly obese, soft, nontender, plus bowel sound  Musculoskeletal: Left BKA well-healed, right lower extremity lymphedema/open ulcers covered and clean, Right third/fourth PCP joints tender to palpation and unable to be straightened fully   Discharge Instructions     Medication List    STOP taking these medications       acetaZOLAMIDE 500 MG capsule  Commonly known as:  DIAMOX     insulin glargine 100 UNIT/ML injection  Commonly known as:  LANTUS     LORazepam 0.5 MG tablet  Commonly known as:  ATIVAN     omeprazole 20 MG capsule  Commonly known as:  PRILOSEC  Replaced by:  pantoprazole 40 MG tablet     OXYCONTIN 10 mg T12a 12 hr tablet  Generic drug:  OxyCODONE  Replaced by:  Oxycodone HCl 10 MG Tabs     traZODone 100 MG tablet  Commonly known as:  DESYREL     XARELTO 20 MG Tabs tablet  Generic drug:  Rivaroxaban      TAKE these medications       albuterol 0.63 MG/3ML nebulizer solution  Commonly known as:  ACCUNEB  Take 1 ampule by nebulization every 6 (six) hours as needed for wheezing.     albuterol (2.5 MG/3ML) 0.083% nebulizer solution  Commonly known as:  PROVENTIL  Take 3 mLs (2.5 mg total) by nebulization  every 6 (six) hours as needed for wheezing or shortness of breath.     allopurinol 100 MG tablet  Commonly known as:  ZYLOPRIM  Take 100 mg by mouth daily.     amLODipine 10 MG tablet  Commonly known as:  NORVASC  Take 10 mg by mouth daily.     apixaban 5 MG Tabs tablet  Commonly known as:  ELIQUIS  Take 1 tablet (5 mg total) by mouth 2 (two) times daily.     carvedilol 25 MG tablet  Commonly known as:  COREG  Take 25 mg by mouth 2 (two) times daily with a meal.     clindamycin 300 MG capsule  Commonly known as:  CLEOCIN  Take 1 capsule (300 mg total) by mouth 3 (three) times daily.     cloNIDine 0.2 MG tablet  Commonly known as:  CATAPRES  Take 0.2 mg by mouth 2 (two) times daily.     collagenase ointment  Commonly known as:  SANTYL  Apply topically daily.  [employer] BETA CAROTENE 62035 UNIT capsule  Generic drug:  beta carotene  Take 25,000 Units by mouth daily.     diphenhydrAMINE 25 MG tablet  Commonly known as:  SOMINEX  Take 25 mg by mouth at bedtime as needed for sleep.     feeding supplement (NEPRO CARB STEADY) Liqd  Take 237 mLs by mouth 3 (three) times daily between meals.     feeding supplement (PRO-STAT SUGAR FREE 64) Liqd  Take 30 mLs by mouth 2 (two) times daily.     ferrous gluconate 324 MG tablet  Commonly known as:  FERGON  Take 324 mg by mouth 2 (two) times daily with a meal.     fexofenadine 180 MG tablet  Commonly known as:  ALLEGRA  Take 180 mg by mouth daily.     fluticasone 50 MCG/ACT nasal spray  Commonly known as:  FLONASE  Place 2 sprays into both nostrils daily.     gabapentin 300 MG capsule  Commonly known as:  NEURONTIN  Take 300 mg by mouth 3 (three) times daily.     heparin 1000 unit/mL Soln injection  4.1 mLs (4,100 Units total) by Dialysis route as needed (in dialysis).     hydrALAZINE 100 MG tablet  Commonly known as:  APRESOLINE  Take 100 mg by mouth 3 (three) times daily.     insulin aspart 100 UNIT/ML injection    Commonly known as:  novoLOG  Inject 0-9 Units into the skin 3 (three) times daily with meals.     lidocaine (PF) 1 % Soln injection  Commonly known as:  XYLOCAINE  Inject 5 mLs into the skin as needed (topical anesthesia for hemodialysis ifGEBAUERS is ineffective.).     lidocaine-prilocaine cream  Commonly known as:  EMLA  Apply 1 application topically as needed (topical anesthesia for hemodialysis if Gebauers and Lidocaine injection are ineffective.).     metoCLOPramide 5 MG tablet  Commonly known as:  REGLAN  Take 1 tablet (5 mg total) by mouth 3 (three) times daily before meals.     NEPHRO-VITE PO  Take by mouth daily.     ondansetron 4 MG tablet  Commonly known as:  ZOFRAN  Take 4 mg by mouth every 8 (eight) hours as needed for nausea or vomiting.     Oxycodone HCl 10 MG Tabs  Take 1 tablet (10 mg total) by mouth every 6 (six) hours as needed for severe pain.     pantoprazole 40 MG tablet  Commonly known as:  PROTONIX  Take 1 tablet (40 mg total) by mouth daily.     pentafluoroprop-tetrafluoroeth Aero  Commonly known as:  GEBAUERS  Apply 1 application topically as needed for mild pain (topical anesthesia for hemodialysis).     pentoxifylline 400 MG CR tablet  Commonly known as:  TRENTAL  Take 400 mg by mouth 3 (three) times daily with meals.     polyethylene glycol packet  Commonly known as:  MIRALAX / GLYCOLAX  Take 17 g by mouth daily.     prednisoLONE acetate 1 % ophthalmic suspension  Commonly known as:  PRED FORTE  Place 1 drop into the left eye 4 (four) times daily.     promethazine 25 MG suppository  Commonly known as:  PHENERGAN  Place 25 mg rectally every 8 (eight) hours as needed for nausea or vomiting.     senna 8.6 MG tablet  Commonly known as:  SENOKOT  Take 2 tablets by mouth daily.  sertraline 25 MG tablet  Commonly known as:  ZOLOFT  Take 75 mg by mouth daily.     simethicone 80 MG chewable tablet  Commonly known as:  MYLICON  Chew  80 mg by mouth every 6 (six) hours as needed for flatulence.     Vitamin D-3 5000 UNITS Tabs  Take 1 tablet by mouth. Monday-Friday       Allergies  Allergen Reactions  . Omnipaque [Iohexol]    Follow-up Information   Follow up with HODGES,BETH, MD. Schedule an appointment as soon as possible for a visit in 2 weeks. [facility] followup)    Specialty:  Family Medicine   Contact information:   [place] [place] [address] [phone]       Follow up In 1 month.      Follow up with MATTINGLY,MICHAEL T, MD. Schedule an appointment as soon as possible for a visit in 3 weeks. (Followup; schedule HD after discharge from [facility])    Specialty:  Nephrology   Contact information:   [facility]  [phone]        The results of significant diagnostics from this hospitalization (including imaging, microbiology, ancillary and laboratory) are listed below for reference.    Significant Diagnostic Studies: Ct Head Wo Contrast  01/22/2014   CLINICAL DATA:  Altered mental status.  EXAM: CT HEAD WITHOUT CONTRAST  TECHNIQUE: Contiguous axial images were obtained from the base of the skull through the vertex without intravenous contrast.  COMPARISON:  None.  FINDINGS: No acute intracranial abnormality. Specifically, no hemorrhage, hydrocephalus, mass lesion, acute infarction, or significant intracranial injury. No acute calvarial abnormality.  Mild mucosal thickening in the paranasal sinuses. Mastoid air cells are clear.  IMPRESSION: No acute intracranial abnormality.  Mild chronic sinusitis.   Electronically Signed   By: Rolm Baptise M.D.   On: 01/22/2014 20:22   US Renal  01/22/2014   CLINICAL DATA:  Renal failure.  Diabetic.  Hypertension.  EXAM: RENAL/URINARY TRACT ULTRASOUND COMPLETE  COMPARISON:  None.  FINDINGS: Right Kidney:  Length: 12.3 cm. No hydronephrosis or mass identified. Renal parenchymal thinning  Left Kidney:  Length: 13.5 cm. No hydronephrosis or mass  identified. Renal parenchymal thinning.  Bladder:  Bladder is full without mass identified.  Prevoid volume of 1072 cc.  IMPRESSION: No hydronephrosis.  Renal parenchymal thinning bilaterally.  Bladder is full with prevoid volume of 1072 cc.   Electronically Signed   By: Chauncey Cruel M.D.   On: 01/22/2014 06:07   Dg Hand 2 View Right  01/28/2014   CLINICAL DATA Hand pain, rule out fracture  EXAM RIGHT HAND - 2 VIEW  COMPARISON None.  FINDINGS There is a fracture at the base of the fifth proximal phalanx which appears chronic. The margins are well corticated. Correlate with history.  Negative for acute fracture.  No significant arthropathy.  IMPRESSION Chronic fracture base of the fifth proximal phalanx.  SIGNATURE  Electronically Signed   By: Franchot Gallo M.D.   On: 01/28/2014 07:52   Mr Tibia Fibula Right Wo Contrast  01/24/2014   CLINICAL DATA:  Osteomyelitis.  EXAM: MRI OF LOWER RIGHT EXTREMITY WITHOUT CONTRAST  TECHNIQUE: Multiplanar, multisequence MR imaging was performed. No intravenous contrast was administered.  COMPARISON:  DG KNEE 1-2V*R* dated 01/12/2013  FINDINGS: After 3 coronal sequences extending from the proximal tibial diaphysis through the ankle joint, the patient refused further imaging.  No significant abnormal osseous edema signal is noted in the visualized portion of the  tibia and fibula on the T2 fat saturated images. There is muscular atrophy throughout the calf along with of muscular and mild subcutaneous edema. No compelling findings of gas within the soft tissues.  IMPRESSION: 1. Muscular and subcutaneous edema in the calf -myositis is not excluded. No obvious abscess. No findings of osteomyelitis. 2. The patient terminated the exam after we obtained 3 coronal sequences. Accordingly the full exam could not be obtained. This reduces diagnostic sensitivity and specificity.   Electronically Signed   By: Sherryl Barters M.D.   On: 01/24/2014 14:43   Dg Chest Port 1 View  01/22/2014    CLINICAL DATA:  Central line placement.  EXAM: PORTABLE CHEST - 1 VIEW  COMPARISON:  DG CHEST 1V PORT dated 01/22/2014  FINDINGS: Left IJ central line tip projects over the SVC.  No pneumothorax.  Mild cardiomegaly noted with pulmonary venous hypertension and interstitial accentuation favoring interstitial edema.  IMPRESSION: 1. Central line tip:  SVC.  No pneumothorax. 2. Cardiomegaly with interstitial accentuation favoring interstitial edema.   Electronically Signed   By: Sherryl Barters M.D.   On: 01/22/2014 17:16   Dg Chest Port 1 View  01/22/2014   CLINICAL DATA:  Line placement  EXAM: PORTABLE CHEST - 1 VIEW  COMPARISON:  Insert scratch pad earlier film of the same day  FINDINGS: Left IJ central line extends to the cavoatrial junction. No pneumothorax evident. Prominent perihilar interstitial markings. No effusion. Heart size upper limits normal. .  IMPRESSION: 1. Central line to cavoatrial junction without pneumothorax.   Electronically Signed   By: Arne Cleveland M.D.   On: 01/22/2014 13:42   Dg Chest Port 1 View  01/22/2014   CLINICAL DATA:  Altered mental status. Fever. Diabetic. High blood pressure  EXAM: PORTABLE CHEST - 1 VIEW  COMPARISON:  05/23/2013.  FINDINGS: Cardiomegaly.  Central pulmonary vascular prominence.  No gross pneumothorax or segmental consolidation.  The patient would eventually benefit from two view chest.  IMPRESSION: Cardiomegaly.  Central pulmonary vascular prominence.   Electronically Signed   By: Chauncey Cruel M.D.   On: 01/22/2014 01:00    Microbiology: Recent Results (from the past 240 hour(s))  CULTURE, BLOOD (ROUTINE X 2)     Status: None   Collection Time    01/21/14 11:42 PM      Result Value Ref Range Status   Specimen Description BLOOD RIGHT ARM   Final   Special Requests BOTTLES DRAWN AEROBIC AND ANAEROBIC 10CC EACH   Final   Culture  Setup Time     Final   Value: 01/22/2014 05:19     Performed at [facility]   Culture     Final   Value: NO  GROWTH 5 DAYS     Performed at [facility]   Report Status 01/28/2014 FINAL   Final  CULTURE, BLOOD (ROUTINE X 2)     Status: None   Collection Time    01/22/14 12:05 AM      Result Value Ref Range Status   Specimen Description BLOOD LEFT ARM   Final   Special Requests BOTTLES DRAWN AEROBIC ONLY 10CC   Final   Culture  Setup Time     Final   Value: 01/22/2014 05:19     Performed at [facility]   Culture     Final   Value: NO GROWTH 5 DAYS     Performed at [facility]   Report Status 01/28/2014 FINAL   Final  MRSA PCR SCREENING     Status: None   Collection Time    01/22/14  7:22 AM      Result Value Ref Range Status   MRSA by PCR NEGATIVE  NEGATIVE Final   Comment:            The GeneXpert MRSA Assay (FDA     approved for NASAL specimens     only), is one component of a     comprehensive MRSA colonization     surveillance program. It is not     intended to diagnose MRSA     infection nor to guide or     monitor treatment for     MRSA infections.     Labs: Basic Metabolic Panel:  Recent Labs Lab 01/22/14 0800 01/22/14 2130 01/23/14 0910 01/25/14 2253 01/26/14 0815 01/26/14 0821 01/28/14 0425  NA 141 140 141 143 143  --  137  K 4.2 4.1 3.3* 3.1* 3.2*  --  3.2*  CL 109 108 105 105 105  --  100  CO2 12* 13* 17* 20 21  --  21  GLUCOSE 116* 115* 83 155* 148*  --  213*  BUN 81* 78* 54* 33* 30*  --  22  CREATININE 4.56* 4.19* 3.17* 2.41* 2.27*  --  2.11*  CALCIUM 8.5 8.4 8.4 8.5 8.3* 7.6* 8.6  MG  --   --   --  1.7  --   --   --   PHOS 6.4*  --  4.0 2.4 2.7  --  2.1*   Liver Function Tests:  Recent Labs Lab 01/22/14 0005 01/22/14 0800 01/23/14 0910 01/25/14 2253 01/26/14 0815 01/28/14 0425  AST 16 14  --  14  --   --   ALT <5 <5  --  6  --   --   ALKPHOS 105 105  --  81  --   --   BILITOT 0.3 0.3  --  0.3  --   --   PROT 7.9 8.0  --  7.2  --   --   ALBUMIN 3.2* 3.1* 2.7* 2.7* 2.6* 2.6*   No results found for this basename:  LIPASE, AMYLASE,  in the last 168 hours No results found for this basename: AMMONIA,  in the last 168 hours CBC:  Recent Labs Lab 01/22/14 0005 01/22/14 0800 01/23/14 0110 01/24/14 0350 01/25/14 2253 01/26/14 0815  WBC 10.7* 10.1 8.3 6.2 8.0 8.0  NEUTROABS 8.8* 8.1*  --   --   --   --   HGB 8.5* 8.3* 7.6* 7.0* 8.2* 8.0*  HCT 27.4* 26.4* 23.7* 21.8* 25.1* 23.9*  MCV 91.6 90.4 86.8 84.5 83.1 83.3  PLT 203 200 225 195 186 181   Cardiac Enzymes:  Recent Labs Lab 01/22/14 0005  TROPONINI <0.30   BNP: BNP (last 3 results) No results found for this basename: PROBNP,  in the last 8760 hours CBG:  Recent Labs Lab 01/27/14 1122 01/27/14 1648 01/27/14 2136 01/28/14 0800 01/28/14 1206  GLUCAP 178* 206* 174* 198* 186*       Signed:  Dia Crawford, MD [facility] [phone] pager

## 2014-01-28 NOTE — Progress Notes (Signed)
S: trying to eat O:BP 109/63  Pulse 100  Temp(Src) 98.2 F (36.8 C) (Oral)  Resp 23  Ht 6\' 1"  (1.854 m)  Wt 188 kg (414 lb 7.4 oz)  BMI 54.69 kg/m2  SpO2 99%  Intake/Output Summary (Last 24 hours) at 01/28/14 0955 Last data filed at 01/28/14 0900  Gross per 24 hour  Intake    900 ml  Output   1625 ml  Net   -725 ml   Weight change:  ZOX:WRUEA and alert CVS:RRR Resp:Clear ant Abd:+ BS NTND Ext:+ edema on Rt.  Bandage Rt LE. NEURO:Ox3 no asterixis   . acetaminophen  650 mg Oral Once  . allopurinol  100 mg Oral Daily  . amLODipine  10 mg Oral Daily  . apixaban  5 mg Oral BID  . b complex-vitamin c-folic acid  1 tablet Oral QHS  . carvedilol  25 mg Oral BID WC  . cholecalciferol  5,000 Units Oral Once per day on Mon Tue Wed Thu Fri  . clindamycin (CLEOCIN) IV  300 mg Intravenous 3 times per day  . cloNIDine  0.2 mg Oral BID  . collagenase   Topical Daily  . darbepoetin (ARANESP) injection - DIALYSIS  100 mcg Intravenous Q Tue-HD  . feeding supplement (NEPRO CARB STEADY)  237 mL Oral TID BM  . feeding supplement (PRO-STAT SUGAR FREE 64)  30 mL Oral BID  . fluticasone  2 spray Each Nare Daily  . heparin  3,000 Units Intracatheter Once  . hydrALAZINE  100 mg Oral TID  . insulin aspart  0-9 Units Subcutaneous TID WC  . loratadine  10 mg Oral Daily  . metoCLOPramide  5 mg Oral TID AC  . pantoprazole  40 mg Oral Daily  . pentoxifylline  400 mg Oral TID WC  . prednisoLONE acetate  1 drop Left Eye QID  . sodium chloride  3 mL Intravenous Q12H  . vancomycin  1,500 mg Intravenous Q T,Th,Sa-HD   Dg Hand 2 View Right  01/28/2014   CLINICAL DATA Hand pain, rule out fracture  EXAM RIGHT HAND - 2 VIEW  COMPARISON None.  FINDINGS There is a fracture at the base of the fifth proximal phalanx which appears chronic. The margins are well corticated. Correlate with history.  Negative for acute fracture.  No significant arthropathy.  IMPRESSION Chronic fracture base of the fifth proximal  phalanx.  SIGNATURE  Electronically Signed   By: Marlan Palau M.D.   On: 01/28/2014 07:52   BMET    Component Value Date/Time   NA 137 01/28/2014 0425   K 3.2* 01/28/2014 0425   CL 100 01/28/2014 0425   CO2 21 01/28/2014 0425   GLUCOSE 213* 01/28/2014 0425   BUN 22 01/28/2014 0425   CREATININE 2.11* 01/28/2014 0425   CALCIUM 8.6 01/28/2014 0425   CALCIUM 7.6* 01/26/2014 0821   GFRNONAA 36* 01/28/2014 0425   GFRAA 42* 01/28/2014 0425   CBC    Component Value Date/Time   WBC 8.0 01/26/2014 0815   RBC 2.87* 01/26/2014 0815   RBC 2.70* 01/22/2014 2130   HGB 8.0* 01/26/2014 0815   HCT 23.9* 01/26/2014 0815   PLT 181 01/26/2014 0815   MCV 83.3 01/26/2014 0815   MCH 27.9 01/26/2014 0815   MCHC 33.5 01/26/2014 0815   RDW 16.2* 01/26/2014 0815   LYMPHSABS 1.0 01/22/2014 0800   MONOABS 0.8 01/22/2014 0800   EOSABS 0.1 01/22/2014 0800   BASOSABS 0.1 01/22/2014 0800     Assessment:  1. ESRD?? His Scr is 2.1 and has not had HD since 01/26/14.  UO good.  Thus I am not convinced yet that he is ESRD 2. Anemia on aranesp 3. DM 4. Decubitus ulcers 5. Hypokalemia  Plan: 1. Hold off on HD for now and follow Scr for now.  ? If placement of foley has help with UO and improving GFR 2.  Replace K 3. Recheck Scr in AM   Meade Hogeland T

## 2014-01-28 NOTE — Plan of Care (Signed)
Problem: Discharge Progression Outcomes Goal: Tolerating diet Outcome: Not Progressing Patient continues to have nausea despite medicating with Zofran and Reglan. PO food intake is limited due to this issue.

## 2014-01-28 NOTE — Progress Notes (Signed)
Brief Nutrition Note  Calorie Count  01/27/2014 Dinner - Not documented  01/28/2014 Breakfast - 60 calories, 3.7g protein Lunch- 33 calories, 3g PRO  Supplements- 850 calories, 38g protein  24 hour calorie count results: 950 calories, 45g protein   Majority of calories coming from Nepro supplements. Patient eating very little at meals. [facility]   Intervention:  Recommend close follow up with PO intake.  Continue to encourage oral supplements (Nepro, Prostat).  MD consider appetite stimulant.   Carloyn Mannerebekah Matznick, [facility]  Nutrition Intern    Intern note/chart reviewed. Revisions made.  Kendell BaneHeather Mayleen Borrero RD, LDN, CNSC [phone] Pager [phone] After Hours Pager

## 2014-01-29 ENCOUNTER — Other Ambulatory Visit (HOSPITAL_COMMUNITY): Payer: Medicare Other

## 2014-01-29 LAB — CBC WITH DIFFERENTIAL/PLATELET
Basophils Absolute: 0.1 10*3/uL (ref 0.0–0.1)
Basophils Relative: 1 % (ref 0–1)
Eosinophils Absolute: 0.3 10*3/uL (ref 0.0–0.7)
Eosinophils Relative: 3 % (ref 0–5)
HCT: 26.3 % — ABNORMAL LOW (ref 39.0–52.0)
HEMOGLOBIN: 8.7 g/dL — AB (ref 13.0–17.0)
LYMPHS PCT: 20 % (ref 12–46)
Lymphs Abs: 1.5 10*3/uL (ref 0.7–4.0)
MCH: 27.6 pg (ref 26.0–34.0)
MCHC: 33.1 g/dL (ref 30.0–36.0)
MCV: 83.5 fL (ref 78.0–100.0)
MONOS PCT: 12 % (ref 3–12)
Monocytes Absolute: 0.9 10*3/uL (ref 0.1–1.0)
NEUTROS PCT: 64 % (ref 43–77)
Neutro Abs: 4.9 10*3/uL (ref 1.7–7.7)
Platelets: 161 10*3/uL (ref 150–400)
RBC: 3.15 MIL/uL — AB (ref 4.22–5.81)
RDW: 16.6 % — ABNORMAL HIGH (ref 11.5–15.5)
WBC: 7.7 10*3/uL (ref 4.0–10.5)

## 2014-01-29 LAB — COMPREHENSIVE METABOLIC PANEL
ALT: 12 U/L (ref 0–53)
AST: 18 U/L (ref 0–37)
Albumin: 2.9 g/dL — ABNORMAL LOW (ref 3.5–5.2)
Alkaline Phosphatase: 74 U/L (ref 39–117)
BILIRUBIN TOTAL: 0.4 mg/dL (ref 0.3–1.2)
BUN: 22 mg/dL (ref 6–23)
CHLORIDE: 101 meq/L (ref 96–112)
CO2: 22 mEq/L (ref 19–32)
Calcium: 8.6 mg/dL (ref 8.4–10.5)
Creatinine, Ser: 2.33 mg/dL — ABNORMAL HIGH (ref 0.50–1.35)
GFR calc non Af Amer: 32 mL/min — ABNORMAL LOW (ref >90)
GFR, EST AFRICAN AMERICAN: 37 mL/min — AB (ref >90)
Glucose, Bld: 175 mg/dL — ABNORMAL HIGH (ref 70–99)
Potassium: 3.3 mEq/L — ABNORMAL LOW (ref 3.7–5.3)
Sodium: 139 mEq/L (ref 137–147)
Total Protein: 7 g/dL (ref 6.0–8.3)

## 2014-01-29 LAB — FOLATE RBC: RBC FOLATE: 501 ng/mL (ref >280)

## 2014-01-29 LAB — PREALBUMIN: Prealbumin: 19 mg/dL (ref 17.0–34.0)

## 2014-01-29 LAB — TSH: TSH: 1.865 u[IU]/mL (ref 0.350–4.500)

## 2014-01-29 LAB — PROTIME-INR
INR: 1.49 (ref 0.00–1.49)
Prothrombin Time: 17.6 seconds — ABNORMAL HIGH (ref 11.6–15.2)

## 2014-01-29 LAB — URIC ACID: Uric Acid, Serum: 4 mg/dL (ref 4.0–7.8)

## 2014-01-29 LAB — HEMOGLOBIN A1C
Hgb A1c MFr Bld: 5.7 % — ABNORMAL HIGH (ref <5.7)
Mean Plasma Glucose: 117 mg/dL — ABNORMAL HIGH (ref <117)

## 2014-01-29 LAB — MAGNESIUM: MAGNESIUM: 1.5 mg/dL (ref 1.5–2.5)

## 2014-01-29 LAB — PHOSPHORUS: Phosphorus: 2.6 mg/dL (ref 2.3–4.6)

## 2014-01-29 LAB — APTT: APTT: 38 s — AB (ref 24–37)

## 2014-01-29 LAB — VITAMIN B12: Vitamin B-12: 492 pg/mL (ref 211–911)

## 2014-01-29 LAB — T4, FREE: Free T4: 1.39 ng/dL (ref 0.80–1.80)

## 2014-01-29 LAB — FERRITIN: FERRITIN: 428 ng/mL — AB (ref 22–322)

## 2014-01-29 LAB — PROCALCITONIN: Procalcitonin: <0.1 ng/mL

## 2014-01-30 LAB — BASIC METABOLIC PANEL
BUN: 25 mg/dL — AB (ref 6–23)
CHLORIDE: 100 meq/L (ref 96–112)
CO2: 22 mEq/L (ref 19–32)
Calcium: 8.8 mg/dL (ref 8.4–10.5)
Creatinine, Ser: 2.24 mg/dL — ABNORMAL HIGH (ref 0.50–1.35)
GFR calc Af Amer: 39 mL/min — ABNORMAL LOW (ref >90)
GFR calc non Af Amer: 34 mL/min — ABNORMAL LOW (ref >90)
GLUCOSE: 204 mg/dL — AB (ref 70–99)
POTASSIUM: 3.3 meq/L — AB (ref 3.7–5.3)
Sodium: 137 mEq/L (ref 137–147)

## 2014-01-30 LAB — CBC
HEMATOCRIT: 27.8 % — AB (ref 39.0–52.0)
Hemoglobin: 9.1 g/dL — ABNORMAL LOW (ref 13.0–17.0)
MCH: 27.4 pg (ref 26.0–34.0)
MCHC: 32.7 g/dL (ref 30.0–36.0)
MCV: 83.7 fL (ref 78.0–100.0)
Platelets: 178 10*3/uL (ref 150–400)
RBC: 3.32 MIL/uL — ABNORMAL LOW (ref 4.22–5.81)
RDW: 16.7 % — AB (ref 11.5–15.5)
WBC: 8.9 10*3/uL (ref 4.0–10.5)

## 2014-01-30 LAB — CLOSTRIDIUM DIFFICILE BY PCR: Toxigenic C. Difficile by PCR: POSITIVE — AB

## 2014-01-31 LAB — CBC
HEMATOCRIT: 27.7 % — AB (ref 39.0–52.0)
HEMOGLOBIN: 9.2 g/dL — AB (ref 13.0–17.0)
MCH: 28 pg (ref 26.0–34.0)
MCHC: 33.2 g/dL (ref 30.0–36.0)
MCV: 84.5 fL (ref 78.0–100.0)
Platelets: 193 10*3/uL (ref 150–400)
RBC: 3.28 MIL/uL — AB (ref 4.22–5.81)
RDW: 17.3 % — ABNORMAL HIGH (ref 11.5–15.5)
WBC: 9.3 10*3/uL (ref 4.0–10.5)

## 2014-01-31 LAB — BASIC METABOLIC PANEL
BUN: 27 mg/dL — AB (ref 6–23)
CALCIUM: 8.7 mg/dL (ref 8.4–10.5)
CO2: 20 meq/L (ref 19–32)
CREATININE: 2.22 mg/dL — AB (ref 0.50–1.35)
Chloride: 100 mEq/L (ref 96–112)
GFR calc Af Amer: 40 mL/min — ABNORMAL LOW (ref >90)
GFR, EST NON AFRICAN AMERICAN: 34 mL/min — AB (ref >90)
GLUCOSE: 190 mg/dL — AB (ref 70–99)
Potassium: 3.5 mEq/L — ABNORMAL LOW (ref 3.7–5.3)
Sodium: 137 mEq/L (ref 137–147)

## 2014-02-01 ENCOUNTER — Telehealth: Payer: Self-pay | Admitting: *Deleted

## 2014-02-01 LAB — COMPREHENSIVE METABOLIC PANEL
ALBUMIN: 3 g/dL — AB (ref 3.5–5.2)
ALK PHOS: 65 U/L (ref 39–117)
ALT: 16 U/L (ref 0–53)
AST: 17 U/L (ref 0–37)
BUN: 29 mg/dL — AB (ref 6–23)
CO2: 22 mEq/L (ref 19–32)
Calcium: 9 mg/dL (ref 8.4–10.5)
Chloride: 102 mEq/L (ref 96–112)
Creatinine, Ser: 2.2 mg/dL — ABNORMAL HIGH (ref 0.50–1.35)
GFR calc non Af Amer: 35 mL/min — ABNORMAL LOW (ref >90)
GFR, EST AFRICAN AMERICAN: 40 mL/min — AB (ref >90)
Glucose, Bld: 184 mg/dL — ABNORMAL HIGH (ref 70–99)
Potassium: 3.6 mEq/L — ABNORMAL LOW (ref 3.7–5.3)
Sodium: 137 mEq/L (ref 137–147)
Total Bilirubin: 0.4 mg/dL (ref 0.3–1.2)
Total Protein: 7 g/dL (ref 6.0–8.3)

## 2014-02-01 LAB — CBC WITH DIFFERENTIAL/PLATELET
Basophils Absolute: 0.1 10*3/uL (ref 0.0–0.1)
Basophils Relative: 1 % (ref 0–1)
Eosinophils Absolute: 0.2 10*3/uL (ref 0.0–0.7)
Eosinophils Relative: 3 % (ref 0–5)
HCT: 27.7 % — ABNORMAL LOW (ref 39.0–52.0)
HEMOGLOBIN: 9 g/dL — AB (ref 13.0–17.0)
LYMPHS ABS: 1.5 10*3/uL (ref 0.7–4.0)
LYMPHS PCT: 16 % (ref 12–46)
MCH: 27.5 pg (ref 26.0–34.0)
MCHC: 32.5 g/dL (ref 30.0–36.0)
MCV: 84.7 fL (ref 78.0–100.0)
MONOS PCT: 11 % (ref 3–12)
Monocytes Absolute: 1 10*3/uL (ref 0.1–1.0)
NEUTROS PCT: 69 % (ref 43–77)
Neutro Abs: 6.3 10*3/uL (ref 1.7–7.7)
Platelets: 209 10*3/uL (ref 150–400)
RBC: 3.27 MIL/uL — AB (ref 4.22–5.81)
RDW: 17.2 % — ABNORMAL HIGH (ref 11.5–15.5)
WBC: 9 10*3/uL (ref 4.0–10.5)

## 2014-02-01 LAB — PHOSPHORUS: Phosphorus: 3.4 mg/dL (ref 2.3–4.6)

## 2014-02-01 LAB — MAGNESIUM: MAGNESIUM: 1.6 mg/dL (ref 1.5–2.5)

## 2014-02-01 NOTE — Telephone Encounter (Signed)
Patient was schedule for Left brachiocephalic AVF insertion on 02-11-14; which was scheduled prior to patient's recent admission for septicemia and leg ulcers. [facility]. I spoke to the PA there ( Ms.Chun) who told me to cancel this surgery for right now. She said that the patient was to be re-evaluated by Dr. Hyman HopesWebb on 02-04-14 and if needed we could reschedule this surgery for a later date. Dr. Myra GianottiBrabham was informed of this and agreed that the patient needs to be re-evaluated prior to surgery because of Dr. Edd ArbourMattingly's 01-28-14 note.

## 2014-02-02 LAB — BASIC METABOLIC PANEL
BUN: 30 mg/dL — ABNORMAL HIGH (ref 6–23)
CO2: 21 mEq/L (ref 19–32)
Calcium: 9 mg/dL (ref 8.4–10.5)
Chloride: 102 mEq/L (ref 96–112)
Creatinine, Ser: 2.09 mg/dL — ABNORMAL HIGH (ref 0.50–1.35)
GFR calc Af Amer: 43 mL/min — ABNORMAL LOW (ref >90)
GFR calc non Af Amer: 37 mL/min — ABNORMAL LOW (ref >90)
Glucose, Bld: 212 mg/dL — ABNORMAL HIGH (ref 70–99)
Potassium: 4 mEq/L (ref 3.7–5.3)
Sodium: 138 mEq/L (ref 137–147)

## 2014-02-03 LAB — BASIC METABOLIC PANEL
BUN: 31 mg/dL — AB (ref 6–23)
CALCIUM: 9 mg/dL (ref 8.4–10.5)
CO2: 20 mEq/L (ref 19–32)
CREATININE: 2.24 mg/dL — AB (ref 0.50–1.35)
Chloride: 103 mEq/L (ref 96–112)
GFR, EST AFRICAN AMERICAN: 39 mL/min — AB (ref >90)
GFR, EST NON AFRICAN AMERICAN: 34 mL/min — AB (ref >90)
GLUCOSE: 171 mg/dL — AB (ref 70–99)
POTASSIUM: 3.8 meq/L (ref 3.7–5.3)
Sodium: 138 mEq/L (ref 137–147)

## 2014-02-03 LAB — CBC
HEMATOCRIT: 28.3 % — AB (ref 39.0–52.0)
Hemoglobin: 9.3 g/dL — ABNORMAL LOW (ref 13.0–17.0)
MCH: 28.1 pg (ref 26.0–34.0)
MCHC: 32.9 g/dL (ref 30.0–36.0)
MCV: 85.5 fL (ref 78.0–100.0)
Platelets: 218 10*3/uL (ref 150–400)
RBC: 3.31 MIL/uL — ABNORMAL LOW (ref 4.22–5.81)
RDW: 17.3 % — AB (ref 11.5–15.5)
WBC: 8.6 10*3/uL (ref 4.0–10.5)

## 2014-02-04 LAB — BASIC METABOLIC PANEL
BUN: 29 mg/dL — AB (ref 6–23)
CO2: 17 mEq/L — ABNORMAL LOW (ref 19–32)
CREATININE: 2.08 mg/dL — AB (ref 0.50–1.35)
Calcium: 8.1 mg/dL — ABNORMAL LOW (ref 8.4–10.5)
Chloride: 94 mEq/L — ABNORMAL LOW (ref 96–112)
GFR, EST AFRICAN AMERICAN: 43 mL/min — AB (ref >90)
GFR, EST NON AFRICAN AMERICAN: 37 mL/min — AB (ref >90)
Glucose, Bld: 481 mg/dL — ABNORMAL HIGH (ref 70–99)
POTASSIUM: 3.5 meq/L — AB (ref 3.7–5.3)
Sodium: 129 mEq/L — ABNORMAL LOW (ref 137–147)

## 2014-02-04 LAB — CBC WITH DIFFERENTIAL/PLATELET
BASOS PCT: 1 % (ref 0–1)
Basophils Absolute: 0.1 10*3/uL (ref 0.0–0.1)
EOS ABS: 0.3 10*3/uL (ref 0.0–0.7)
EOS PCT: 3 % (ref 0–5)
HEMATOCRIT: 26.7 % — AB (ref 39.0–52.0)
Hemoglobin: 8.6 g/dL — ABNORMAL LOW (ref 13.0–17.0)
Lymphocytes Relative: 15 % (ref 12–46)
Lymphs Abs: 1.4 10*3/uL (ref 0.7–4.0)
MCH: 28.1 pg (ref 26.0–34.0)
MCHC: 32.2 g/dL (ref 30.0–36.0)
MCV: 87.3 fL (ref 78.0–100.0)
MONO ABS: 1.2 10*3/uL — AB (ref 0.1–1.0)
Monocytes Relative: 12 % (ref 3–12)
Neutro Abs: 6.7 10*3/uL (ref 1.7–7.7)
Neutrophils Relative %: 70 % (ref 43–77)
Platelets: 234 10*3/uL (ref 150–400)
RBC: 3.06 MIL/uL — AB (ref 4.22–5.81)
RDW: 17.6 % — ABNORMAL HIGH (ref 11.5–15.5)
WBC: 9.6 10*3/uL (ref 4.0–10.5)

## 2014-02-04 LAB — RENAL FUNCTION PANEL
Albumin: 2.8 g/dL — ABNORMAL LOW (ref 3.5–5.2)
BUN: 29 mg/dL — ABNORMAL HIGH (ref 6–23)
CO2: 17 meq/L — AB (ref 19–32)
Calcium: 8.2 mg/dL — ABNORMAL LOW (ref 8.4–10.5)
Chloride: 94 mEq/L — ABNORMAL LOW (ref 96–112)
Creatinine, Ser: 2.02 mg/dL — ABNORMAL HIGH (ref 0.50–1.35)
GFR calc Af Amer: 44 mL/min — ABNORMAL LOW (ref >90)
GFR calc non Af Amer: 38 mL/min — ABNORMAL LOW (ref >90)
GLUCOSE: 478 mg/dL — AB (ref 70–99)
PHOSPHORUS: 4.2 mg/dL (ref 2.3–4.6)
Potassium: 3.5 mEq/L — ABNORMAL LOW (ref 3.7–5.3)
Sodium: 128 mEq/L — ABNORMAL LOW (ref 137–147)

## 2014-02-04 LAB — HEPATITIS B SURFACE ANTIBODY,QUALITATIVE: Hep B S Ab: NEGATIVE

## 2014-02-04 LAB — PTH, INTACT AND CALCIUM
Calcium, Total (PTH): 8.6 mg/dL (ref 8.4–10.5)
PTH: 51.1 pg/mL (ref 14.0–72.0)

## 2014-02-04 LAB — HEPATITIS B SURFACE ANTIGEN: HEP B S AG: NEGATIVE

## 2014-02-05 LAB — RENAL FUNCTION PANEL
Albumin: 3 g/dL — ABNORMAL LOW (ref 3.5–5.2)
BUN: 35 mg/dL — ABNORMAL HIGH (ref 6–23)
CO2: 19 meq/L (ref 19–32)
Calcium: 9 mg/dL (ref 8.4–10.5)
Chloride: 105 mEq/L (ref 96–112)
Creatinine, Ser: 2.27 mg/dL — ABNORMAL HIGH (ref 0.50–1.35)
GFR calc non Af Amer: 33 mL/min — ABNORMAL LOW (ref >90)
GFR, EST AFRICAN AMERICAN: 39 mL/min — AB (ref >90)
Glucose, Bld: 122 mg/dL — ABNORMAL HIGH (ref 70–99)
PHOSPHORUS: 4.1 mg/dL (ref 2.3–4.6)
Potassium: 3.7 mEq/L (ref 3.7–5.3)
Sodium: 141 mEq/L (ref 137–147)

## 2014-02-05 LAB — CBC
HCT: 27.6 % — ABNORMAL LOW (ref 39.0–52.0)
HEMOGLOBIN: 9.2 g/dL — AB (ref 13.0–17.0)
MCH: 28.8 pg (ref 26.0–34.0)
MCHC: 33.3 g/dL (ref 30.0–36.0)
MCV: 86.5 fL (ref 78.0–100.0)
PLATELETS: 234 10*3/uL (ref 150–400)
RBC: 3.19 MIL/uL — ABNORMAL LOW (ref 4.22–5.81)
RDW: 17.3 % — AB (ref 11.5–15.5)
WBC: 8.9 10*3/uL (ref 4.0–10.5)

## 2014-02-07 LAB — RENAL FUNCTION PANEL
ALBUMIN: 3 g/dL — AB (ref 3.5–5.2)
BUN: 38 mg/dL — ABNORMAL HIGH (ref 6–23)
CO2: 21 mEq/L (ref 19–32)
Calcium: 9 mg/dL (ref 8.4–10.5)
Chloride: 107 mEq/L (ref 96–112)
Creatinine, Ser: 2.2 mg/dL — ABNORMAL HIGH (ref 0.50–1.35)
GFR, EST AFRICAN AMERICAN: 40 mL/min — AB (ref >90)
GFR, EST NON AFRICAN AMERICAN: 35 mL/min — AB (ref >90)
Glucose, Bld: 122 mg/dL — ABNORMAL HIGH (ref 70–99)
PHOSPHORUS: 3.8 mg/dL (ref 2.3–4.6)
POTASSIUM: 3.8 meq/L (ref 3.7–5.3)
Sodium: 142 mEq/L (ref 137–147)

## 2014-02-08 LAB — CBC WITH DIFFERENTIAL/PLATELET
BASOS ABS: 0.1 10*3/uL (ref 0.0–0.1)
BASOS PCT: 1 % (ref 0–1)
Eosinophils Absolute: 0.2 10*3/uL (ref 0.0–0.7)
Eosinophils Relative: 3 % (ref 0–5)
HCT: 28.2 % — ABNORMAL LOW (ref 39.0–52.0)
Hemoglobin: 8.9 g/dL — ABNORMAL LOW (ref 13.0–17.0)
Lymphocytes Relative: 17 % (ref 12–46)
Lymphs Abs: 1.3 10*3/uL (ref 0.7–4.0)
MCH: 27.9 pg (ref 26.0–34.0)
MCHC: 31.6 g/dL (ref 30.0–36.0)
MCV: 88.4 fL (ref 78.0–100.0)
Monocytes Absolute: 0.7 10*3/uL (ref 0.1–1.0)
Monocytes Relative: 9 % (ref 3–12)
NEUTROS ABS: 5.4 10*3/uL (ref 1.7–7.7)
Neutrophils Relative %: 70 % (ref 43–77)
PLATELETS: 229 10*3/uL (ref 150–400)
RBC: 3.19 MIL/uL — ABNORMAL LOW (ref 4.22–5.81)
RDW: 16.9 % — AB (ref 11.5–15.5)
WBC: 7.7 10*3/uL (ref 4.0–10.5)

## 2014-02-08 LAB — COMPREHENSIVE METABOLIC PANEL
ALBUMIN: 3 g/dL — AB (ref 3.5–5.2)
ALT: 13 U/L (ref 0–53)
AST: 16 U/L (ref 0–37)
Alkaline Phosphatase: 63 U/L (ref 39–117)
BUN: 39 mg/dL — ABNORMAL HIGH (ref 6–23)
CALCIUM: 9.1 mg/dL (ref 8.4–10.5)
CO2: 21 meq/L (ref 19–32)
Chloride: 106 mEq/L (ref 96–112)
Creatinine, Ser: 2.18 mg/dL — ABNORMAL HIGH (ref 0.50–1.35)
GFR calc Af Amer: 41 mL/min — ABNORMAL LOW (ref >90)
GFR, EST NON AFRICAN AMERICAN: 35 mL/min — AB (ref >90)
Glucose, Bld: 144 mg/dL — ABNORMAL HIGH (ref 70–99)
Potassium: 3.8 mEq/L (ref 3.7–5.3)
SODIUM: 141 meq/L (ref 137–147)
Total Bilirubin: 0.5 mg/dL (ref 0.3–1.2)
Total Protein: 7 g/dL (ref 6.0–8.3)

## 2014-02-09 LAB — RENAL FUNCTION PANEL
Albumin: 3.1 g/dL — ABNORMAL LOW (ref 3.5–5.2)
BUN: 41 mg/dL — AB (ref 6–23)
CO2: 21 mEq/L (ref 19–32)
Calcium: 9.3 mg/dL (ref 8.4–10.5)
Chloride: 106 mEq/L (ref 96–112)
Creatinine, Ser: 2.16 mg/dL — ABNORMAL HIGH (ref 0.50–1.35)
GFR calc non Af Amer: 35 mL/min — ABNORMAL LOW (ref >90)
GFR, EST AFRICAN AMERICAN: 41 mL/min — AB (ref >90)
GLUCOSE: 110 mg/dL — AB (ref 70–99)
PHOSPHORUS: 4.2 mg/dL (ref 2.3–4.6)
POTASSIUM: 4 meq/L (ref 3.7–5.3)
SODIUM: 142 meq/L (ref 137–147)

## 2014-02-10 LAB — RENAL FUNCTION PANEL
Albumin: 3 g/dL — ABNORMAL LOW (ref 3.5–5.2)
BUN: 44 mg/dL — ABNORMAL HIGH (ref 6–23)
CALCIUM: 9.3 mg/dL (ref 8.4–10.5)
CHLORIDE: 105 meq/L (ref 96–112)
CO2: 21 meq/L (ref 19–32)
Creatinine, Ser: 2.2 mg/dL — ABNORMAL HIGH (ref 0.50–1.35)
GFR calc non Af Amer: 35 mL/min — ABNORMAL LOW (ref >90)
GFR, EST AFRICAN AMERICAN: 40 mL/min — AB (ref >90)
GLUCOSE: 127 mg/dL — AB (ref 70–99)
Phosphorus: 4.4 mg/dL (ref 2.3–4.6)
Potassium: 4 mEq/L (ref 3.7–5.3)
SODIUM: 141 meq/L (ref 137–147)

## 2014-02-10 LAB — PREALBUMIN: Prealbumin: 25.4 mg/dL (ref 17.0–34.0)

## 2014-02-11 ENCOUNTER — Ambulatory Visit (HOSPITAL_COMMUNITY): Admission: RE | Admit: 2014-02-11 | Payer: Medicare Other | Source: Ambulatory Visit | Admitting: Surgery

## 2014-02-11 ENCOUNTER — Encounter (HOSPITAL_COMMUNITY): Admission: RE | Payer: Self-pay | Source: Ambulatory Visit

## 2014-02-11 SURGERY — ARTERIOVENOUS (AV) FISTULA CREATION
Anesthesia: Monitor Anesthesia Care | Laterality: Left

## 2014-02-12 LAB — CBC
HCT: 26.3 % — ABNORMAL LOW (ref 39.0–52.0)
HEMOGLOBIN: 8.5 g/dL — AB (ref 13.0–17.0)
MCH: 28.4 pg (ref 26.0–34.0)
MCHC: 32.3 g/dL (ref 30.0–36.0)
MCV: 88 fL (ref 78.0–100.0)
Platelets: 241 10*3/uL (ref 150–400)
RBC: 2.99 MIL/uL — ABNORMAL LOW (ref 4.22–5.81)
RDW: 15.8 % — ABNORMAL HIGH (ref 11.5–15.5)
WBC: 7 10*3/uL (ref 4.0–10.5)

## 2014-02-12 LAB — BASIC METABOLIC PANEL
BUN: 48 mg/dL — AB (ref 6–23)
CO2: 20 meq/L (ref 19–32)
Calcium: 9.3 mg/dL (ref 8.4–10.5)
Chloride: 104 mEq/L (ref 96–112)
Creatinine, Ser: 2.38 mg/dL — ABNORMAL HIGH (ref 0.50–1.35)
GFR calc Af Amer: 36 mL/min — ABNORMAL LOW (ref >90)
GFR, EST NON AFRICAN AMERICAN: 31 mL/min — AB (ref >90)
Glucose, Bld: 125 mg/dL — ABNORMAL HIGH (ref 70–99)
POTASSIUM: 4.1 meq/L (ref 3.7–5.3)
SODIUM: 138 meq/L (ref 137–147)

## 2014-02-15 LAB — BASIC METABOLIC PANEL
BUN: 37 mg/dL — ABNORMAL HIGH (ref 6–23)
CO2: 21 mEq/L (ref 19–32)
Calcium: 9.2 mg/dL (ref 8.4–10.5)
Chloride: 104 mEq/L (ref 96–112)
Creatinine, Ser: 2.33 mg/dL — ABNORMAL HIGH (ref 0.50–1.35)
GFR, EST AFRICAN AMERICAN: 37 mL/min — AB (ref >90)
GFR, EST NON AFRICAN AMERICAN: 32 mL/min — AB (ref >90)
Glucose, Bld: 136 mg/dL — ABNORMAL HIGH (ref 70–99)
POTASSIUM: 3.9 meq/L (ref 3.7–5.3)
SODIUM: 140 meq/L (ref 137–147)

## 2014-02-15 LAB — CBC WITH DIFFERENTIAL/PLATELET
BASOS PCT: 1 % (ref 0–1)
Basophils Absolute: 0.1 10*3/uL (ref 0.0–0.1)
Eosinophils Absolute: 0.2 10*3/uL (ref 0.0–0.7)
Eosinophils Relative: 3 % (ref 0–5)
HCT: 27.3 % — ABNORMAL LOW (ref 39.0–52.0)
Hemoglobin: 9.1 g/dL — ABNORMAL LOW (ref 13.0–17.0)
LYMPHS ABS: 1.9 10*3/uL (ref 0.7–4.0)
Lymphocytes Relative: 27 % (ref 12–46)
MCH: 29.2 pg (ref 26.0–34.0)
MCHC: 33.3 g/dL (ref 30.0–36.0)
MCV: 87.5 fL (ref 78.0–100.0)
Monocytes Absolute: 0.6 10*3/uL (ref 0.1–1.0)
Monocytes Relative: 8 % (ref 3–12)
NEUTROS PCT: 61 % (ref 43–77)
Neutro Abs: 4.3 10*3/uL (ref 1.7–7.7)
PLATELETS: 247 10*3/uL (ref 150–400)
RBC: 3.12 MIL/uL — AB (ref 4.22–5.81)
RDW: 15.7 % — ABNORMAL HIGH (ref 11.5–15.5)
WBC: 7.1 10*3/uL (ref 4.0–10.5)

## 2014-02-15 LAB — PREALBUMIN: PREALBUMIN: 22 mg/dL (ref 17.0–34.0)

## 2014-02-18 LAB — CLOSTRIDIUM DIFFICILE BY PCR: CDIFFPCR: POSITIVE — AB

## 2014-02-19 LAB — BASIC METABOLIC PANEL
BUN: 41 mg/dL — ABNORMAL HIGH (ref 6–23)
CHLORIDE: 104 meq/L (ref 96–112)
CO2: 20 mEq/L (ref 19–32)
CREATININE: 2.5 mg/dL — AB (ref 0.50–1.35)
Calcium: 9.2 mg/dL (ref 8.4–10.5)
GFR calc non Af Amer: 30 mL/min — ABNORMAL LOW (ref >90)
GFR, EST AFRICAN AMERICAN: 34 mL/min — AB (ref >90)
Glucose, Bld: 135 mg/dL — ABNORMAL HIGH (ref 70–99)
Potassium: 4 mEq/L (ref 3.7–5.3)
SODIUM: 138 meq/L (ref 137–147)

## 2014-02-19 LAB — CBC WITH DIFFERENTIAL/PLATELET
BASOS PCT: 1 % (ref 0–1)
Basophils Absolute: 0.1 10*3/uL (ref 0.0–0.1)
Eosinophils Absolute: 0.3 10*3/uL (ref 0.0–0.7)
Eosinophils Relative: 4 % (ref 0–5)
HEMATOCRIT: 27.1 % — AB (ref 39.0–52.0)
Hemoglobin: 8.9 g/dL — ABNORMAL LOW (ref 13.0–17.0)
LYMPHS PCT: 22 % (ref 12–46)
Lymphs Abs: 1.5 10*3/uL (ref 0.7–4.0)
MCH: 29.3 pg (ref 26.0–34.0)
MCHC: 32.8 g/dL (ref 30.0–36.0)
MCV: 89.1 fL (ref 78.0–100.0)
MONO ABS: 0.6 10*3/uL (ref 0.1–1.0)
Monocytes Relative: 9 % (ref 3–12)
NEUTROS PCT: 64 % (ref 43–77)
Neutro Abs: 4.6 10*3/uL (ref 1.7–7.7)
PLATELETS: 243 10*3/uL (ref 150–400)
RBC: 3.04 MIL/uL — ABNORMAL LOW (ref 4.22–5.81)
RDW: 16 % — AB (ref 11.5–15.5)
WBC: 7.1 10*3/uL (ref 4.0–10.5)

## 2014-02-20 LAB — BASIC METABOLIC PANEL
BUN: 42 mg/dL — ABNORMAL HIGH (ref 6–23)
CALCIUM: 8.7 mg/dL (ref 8.4–10.5)
CO2: 19 mEq/L (ref 19–32)
Chloride: 103 mEq/L (ref 96–112)
Creatinine, Ser: 2.6 mg/dL — ABNORMAL HIGH (ref 0.50–1.35)
GFR calc Af Amer: 33 mL/min — ABNORMAL LOW (ref >90)
GFR, EST NON AFRICAN AMERICAN: 28 mL/min — AB (ref >90)
Glucose, Bld: 106 mg/dL — ABNORMAL HIGH (ref 70–99)
POTASSIUM: 3.9 meq/L (ref 3.7–5.3)
Sodium: 139 mEq/L (ref 137–147)

## 2014-02-21 LAB — CBC WITH DIFFERENTIAL/PLATELET
Basophils Absolute: 0 10*3/uL (ref 0.0–0.1)
Basophils Relative: 1 % (ref 0–1)
Eosinophils Absolute: 0.3 10*3/uL (ref 0.0–0.7)
Eosinophils Relative: 4 % (ref 0–5)
HCT: 27 % — ABNORMAL LOW (ref 39.0–52.0)
HEMOGLOBIN: 8.9 g/dL — AB (ref 13.0–17.0)
LYMPHS PCT: 22 % (ref 12–46)
Lymphs Abs: 1.3 10*3/uL (ref 0.7–4.0)
MCH: 29.3 pg (ref 26.0–34.0)
MCHC: 33 g/dL (ref 30.0–36.0)
MCV: 88.8 fL (ref 78.0–100.0)
MONOS PCT: 13 % — AB (ref 3–12)
Monocytes Absolute: 0.8 10*3/uL (ref 0.1–1.0)
NEUTROS ABS: 3.7 10*3/uL (ref 1.7–7.7)
NEUTROS PCT: 60 % (ref 43–77)
Platelets: 226 10*3/uL (ref 150–400)
RBC: 3.04 MIL/uL — AB (ref 4.22–5.81)
RDW: 15.7 % — ABNORMAL HIGH (ref 11.5–15.5)
WBC: 6.1 10*3/uL (ref 4.0–10.5)

## 2014-02-21 LAB — BASIC METABOLIC PANEL
BUN: 43 mg/dL — ABNORMAL HIGH (ref 6–23)
CHLORIDE: 102 meq/L (ref 96–112)
CO2: 20 mEq/L (ref 19–32)
Calcium: 8.9 mg/dL (ref 8.4–10.5)
Creatinine, Ser: 2.54 mg/dL — ABNORMAL HIGH (ref 0.50–1.35)
GFR, EST AFRICAN AMERICAN: 34 mL/min — AB (ref >90)
GFR, EST NON AFRICAN AMERICAN: 29 mL/min — AB (ref >90)
Glucose, Bld: 114 mg/dL — ABNORMAL HIGH (ref 70–99)
POTASSIUM: 4.1 meq/L (ref 3.7–5.3)
Sodium: 138 mEq/L (ref 137–147)

## 2014-04-08 ENCOUNTER — Ambulatory Visit: Payer: Self-pay | Admitting: Internal Medicine

## 2014-04-21 ENCOUNTER — Ambulatory Visit: Payer: Self-pay | Admitting: Internal Medicine

## 2014-04-21 LAB — IRON AND TIBC
IRON BIND. CAP.(TOTAL): 224 ug/dL — AB (ref 250–450)
Iron Saturation: 20 %
Iron: 44 ug/dL — ABNORMAL LOW (ref 65–175)
Unbound Iron-Bind.Cap.: 180 ug/dL

## 2014-04-21 LAB — CBC CANCER CENTER
Basophil #: 0.1 "x10 3/mm "
Basophil %: 1.3 %
Eosinophil #: 0.4 "x10 3/mm "
Eosinophil %: 5.6 %
HCT: 32.4 % — ABNORMAL LOW
HGB: 10.6 g/dL — ABNORMAL LOW
Lymphocyte %: 27.4 %
Lymphs Abs: 1.7 "x10 3/mm "
MCH: 29.5 pg
MCHC: 32.7 g/dL
MCV: 90 fL
Monocyte #: 0.5 "x10 3/mm "
Monocyte %: 8 %
Neutrophil #: 3.6 "x10 3/mm "
Neutrophil %: 57.7 %
Platelet: 248 "x10 3/mm "
RBC: 3.6 "x10 6/mm " — ABNORMAL LOW
RDW: 13.4 %
WBC: 6.3 "x10 3/mm "

## 2014-04-21 LAB — FOLATE: Folic Acid: 26.4 ng/mL (ref 3.1–100.0)

## 2014-05-06 LAB — CANCER CENTER HEMOGLOBIN: HGB: 9.8 g/dL — AB (ref 13.0–18.0)

## 2014-05-19 ENCOUNTER — Ambulatory Visit: Payer: Self-pay | Admitting: Internal Medicine

## 2014-05-19 LAB — CANCER CENTER HEMOGLOBIN: HGB: 11.1 g/dL — ABNORMAL LOW (ref 13.0–18.0)

## 2014-06-02 LAB — CBC CANCER CENTER
Basophil #: 0.1 x10 3/mm (ref 0.0–0.1)
Basophil %: 1.3 %
EOS ABS: 0.3 x10 3/mm (ref 0.0–0.7)
EOS PCT: 3.8 %
HCT: 33.5 % — ABNORMAL LOW (ref 40.0–52.0)
HGB: 11 g/dL — ABNORMAL LOW (ref 13.0–18.0)
Lymphocyte #: 1.9 x10 3/mm (ref 1.0–3.6)
Lymphocyte %: 22.4 %
MCH: 28.8 pg (ref 26.0–34.0)
MCHC: 32.8 g/dL (ref 32.0–36.0)
MCV: 88 fL (ref 80–100)
Monocyte #: 0.7 x10 3/mm (ref 0.2–1.0)
Monocyte %: 8.6 %
NEUTROS PCT: 63.9 %
Neutrophil #: 5.3 x10 3/mm (ref 1.4–6.5)
PLATELETS: 244 x10 3/mm (ref 150–440)
RBC: 3.81 10*6/uL — ABNORMAL LOW (ref 4.40–5.90)
RDW: 13.8 % (ref 11.5–14.5)
WBC: 8.4 x10 3/mm (ref 3.8–10.6)

## 2014-06-19 ENCOUNTER — Ambulatory Visit: Payer: Self-pay | Admitting: Internal Medicine

## 2014-06-22 ENCOUNTER — Inpatient Hospital Stay: Payer: Self-pay | Admitting: Internal Medicine

## 2014-06-22 LAB — CBC WITH DIFFERENTIAL/PLATELET
BASOS ABS: 0 10*3/uL (ref 0.0–0.1)
Basophil %: 0.3 %
Eosinophil #: 0 10*3/uL (ref 0.0–0.7)
Eosinophil %: 0 %
HCT: 30.7 % — AB (ref 40.0–52.0)
HGB: 10.4 g/dL — AB (ref 13.0–18.0)
LYMPHS ABS: 0.5 10*3/uL — AB (ref 1.0–3.6)
LYMPHS PCT: 4.5 %
MCH: 29.4 pg (ref 26.0–34.0)
MCHC: 33.8 g/dL (ref 32.0–36.0)
MCV: 87 fL (ref 80–100)
MONO ABS: 0.6 x10 3/mm (ref 0.2–1.0)
Monocyte %: 5.6 %
NEUTROS PCT: 89.6 %
Neutrophil #: 10.2 10*3/uL — ABNORMAL HIGH (ref 1.4–6.5)
Platelet: 143 10*3/uL — ABNORMAL LOW (ref 150–440)
RBC: 3.53 10*6/uL — AB (ref 4.40–5.90)
RDW: 13.5 % (ref 11.5–14.5)
WBC: 11.4 10*3/uL — ABNORMAL HIGH (ref 3.8–10.6)

## 2014-06-22 LAB — COMPREHENSIVE METABOLIC PANEL
ALBUMIN: 2.7 g/dL — AB (ref 3.4–5.0)
ANION GAP: 11 (ref 7–16)
Alkaline Phosphatase: 54 U/L
BILIRUBIN TOTAL: 1.1 mg/dL — AB (ref 0.2–1.0)
BUN: 54 mg/dL — AB (ref 7–18)
CHLORIDE: 102 mmol/L (ref 98–107)
CREATININE: 4.3 mg/dL — AB (ref 0.60–1.30)
Calcium, Total: 8.6 mg/dL (ref 8.5–10.1)
Co2: 23 mmol/L (ref 21–32)
EGFR (African American): 18 — ABNORMAL LOW
EGFR (Non-African Amer.): 16 — ABNORMAL LOW
Glucose: 154 mg/dL — ABNORMAL HIGH (ref 65–99)
OSMOLALITY: 290 (ref 275–301)
POTASSIUM: 4.3 mmol/L (ref 3.5–5.1)
SGOT(AST): 17 U/L (ref 15–37)
SGPT (ALT): 19 U/L
Sodium: 136 mmol/L (ref 136–145)
TOTAL PROTEIN: 7.3 g/dL (ref 6.4–8.2)

## 2014-06-22 LAB — URINALYSIS, COMPLETE
Bacteria: NONE SEEN
GLUCOSE, UR: NEGATIVE mg/dL (ref 0–75)
KETONE: NEGATIVE
Leukocyte Esterase: NEGATIVE
Nitrite: NEGATIVE
PH: 5 (ref 4.5–8.0)
SPECIFIC GRAVITY: 1.019 (ref 1.003–1.030)
Squamous Epithelial: NONE SEEN
WBC UR: 2 /HPF (ref 0–5)

## 2014-06-22 LAB — TROPONIN I
Troponin-I: 0.07 ng/mL — ABNORMAL HIGH
Troponin-I: 0.08 ng/mL — ABNORMAL HIGH

## 2014-06-22 LAB — PROTIME-INR
INR: 1.9
PROTHROMBIN TIME: 21 s — AB (ref 11.5–14.7)

## 2014-06-22 LAB — LIPASE, BLOOD: Lipase: 185 U/L (ref 73–393)

## 2014-06-22 LAB — MAGNESIUM: MAGNESIUM: 1.1 mg/dL — AB

## 2014-06-22 LAB — PHOSPHORUS: PHOSPHORUS: 2 mg/dL — AB (ref 2.5–4.9)

## 2014-06-23 LAB — CBC WITH DIFFERENTIAL/PLATELET
Basophil #: 0 10*3/uL (ref 0.0–0.1)
Basophil %: 0.3 %
Eosinophil #: 0 10*3/uL (ref 0.0–0.7)
Eosinophil %: 0 %
HCT: 25.3 % — ABNORMAL LOW (ref 40.0–52.0)
HGB: 8.5 g/dL — ABNORMAL LOW (ref 13.0–18.0)
LYMPHS ABS: 0.8 10*3/uL — AB (ref 1.0–3.6)
Lymphocyte %: 6.5 %
MCH: 29.3 pg (ref 26.0–34.0)
MCHC: 33.7 g/dL (ref 32.0–36.0)
MCV: 87 fL (ref 80–100)
MONOS PCT: 10.4 %
Monocyte #: 1.3 x10 3/mm — ABNORMAL HIGH (ref 0.2–1.0)
NEUTROS ABS: 10.3 10*3/uL — AB (ref 1.4–6.5)
Neutrophil %: 82.8 %
Platelet: 128 10*3/uL — ABNORMAL LOW (ref 150–440)
RBC: 2.91 10*6/uL — ABNORMAL LOW (ref 4.40–5.90)
RDW: 13.4 % (ref 11.5–14.5)
WBC: 12.4 10*3/uL — AB (ref 3.8–10.6)

## 2014-06-23 LAB — COMPREHENSIVE METABOLIC PANEL
ALK PHOS: 47 U/L
ANION GAP: 9 (ref 7–16)
AST: 20 U/L (ref 15–37)
Albumin: 2.3 g/dL — ABNORMAL LOW (ref 3.4–5.0)
BILIRUBIN TOTAL: 1 mg/dL (ref 0.2–1.0)
BUN: 53 mg/dL — ABNORMAL HIGH (ref 7–18)
Calcium, Total: 7.6 mg/dL — ABNORMAL LOW (ref 8.5–10.1)
Chloride: 107 mmol/L (ref 98–107)
Co2: 22 mmol/L (ref 21–32)
Creatinine: 4.35 mg/dL — ABNORMAL HIGH (ref 0.60–1.30)
EGFR (African American): 18 — ABNORMAL LOW
GFR CALC NON AF AMER: 15 — AB
GLUCOSE: 145 mg/dL — AB (ref 65–99)
Osmolality: 293 (ref 275–301)
Potassium: 4.3 mmol/L (ref 3.5–5.1)
SGPT (ALT): 17 U/L
SODIUM: 138 mmol/L (ref 136–145)
TOTAL PROTEIN: 6.5 g/dL (ref 6.4–8.2)

## 2014-06-23 LAB — TROPONIN I: TROPONIN-I: 0.07 ng/mL — AB

## 2014-06-23 LAB — TSH: Thyroid Stimulating Horm: 1.27 u[IU]/mL

## 2014-06-24 LAB — BASIC METABOLIC PANEL
Anion Gap: 9 (ref 7–16)
BUN: 57 mg/dL — ABNORMAL HIGH (ref 7–18)
CALCIUM: 7.5 mg/dL — AB (ref 8.5–10.1)
CHLORIDE: 106 mmol/L (ref 98–107)
CREATININE: 3.67 mg/dL — AB (ref 0.60–1.30)
Co2: 23 mmol/L (ref 21–32)
GFR CALC AF AMER: 22 — AB
GFR CALC NON AF AMER: 19 — AB
Glucose: 117 mg/dL — ABNORMAL HIGH (ref 65–99)
Osmolality: 293 (ref 275–301)
Potassium: 4.2 mmol/L (ref 3.5–5.1)
Sodium: 138 mmol/L (ref 136–145)

## 2014-06-24 LAB — CBC WITH DIFFERENTIAL/PLATELET
BASOS ABS: 0 10*3/uL (ref 0.0–0.1)
BASOS PCT: 0.2 %
EOS PCT: 0.5 %
Eosinophil #: 0.1 10*3/uL (ref 0.0–0.7)
HCT: 25.1 % — ABNORMAL LOW (ref 40.0–52.0)
HGB: 8.5 g/dL — AB (ref 13.0–18.0)
LYMPHS PCT: 7.9 %
Lymphocyte #: 0.8 10*3/uL — ABNORMAL LOW (ref 1.0–3.6)
MCH: 29.4 pg (ref 26.0–34.0)
MCHC: 34.1 g/dL (ref 32.0–36.0)
MCV: 86 fL (ref 80–100)
Monocyte #: 1.3 x10 3/mm — ABNORMAL HIGH (ref 0.2–1.0)
Monocyte %: 12 %
Neutrophil #: 8.4 10*3/uL — ABNORMAL HIGH (ref 1.4–6.5)
Neutrophil %: 79.4 %
Platelet: 114 10*3/uL — ABNORMAL LOW (ref 150–440)
RBC: 2.91 10*6/uL — AB (ref 4.40–5.90)
RDW: 14.1 % (ref 11.5–14.5)
WBC: 10.6 10*3/uL (ref 3.8–10.6)

## 2014-06-24 LAB — PHOSPHORUS: Phosphorus: 1.9 mg/dL — ABNORMAL LOW (ref 2.5–4.9)

## 2014-06-24 LAB — MAGNESIUM: MAGNESIUM: 1.5 mg/dL — AB

## 2014-06-24 LAB — URINE CULTURE

## 2014-06-25 DIAGNOSIS — I359 Nonrheumatic aortic valve disorder, unspecified: Secondary | ICD-10-CM

## 2014-06-25 LAB — BASIC METABOLIC PANEL
Anion Gap: 8 (ref 7–16)
BUN: 52 mg/dL — ABNORMAL HIGH (ref 7–18)
Calcium, Total: 7.8 mg/dL — ABNORMAL LOW (ref 8.5–10.1)
Chloride: 108 mmol/L — ABNORMAL HIGH (ref 98–107)
Co2: 22 mmol/L (ref 21–32)
Creatinine: 3.22 mg/dL — ABNORMAL HIGH (ref 0.60–1.30)
EGFR (Non-African Amer.): 22 — ABNORMAL LOW
GFR CALC AF AMER: 26 — AB
GLUCOSE: 96 mg/dL (ref 65–99)
Osmolality: 290 (ref 275–301)
POTASSIUM: 4 mmol/L (ref 3.5–5.1)
SODIUM: 138 mmol/L (ref 136–145)

## 2014-06-25 LAB — CBC WITH DIFFERENTIAL/PLATELET
Basophil #: 0 10*3/uL (ref 0.0–0.1)
Basophil %: 0.5 %
EOS PCT: 3.2 %
Eosinophil #: 0.3 10*3/uL (ref 0.0–0.7)
HCT: 25 % — AB (ref 40.0–52.0)
HGB: 8.4 g/dL — ABNORMAL LOW (ref 13.0–18.0)
Lymphocyte #: 1.3 10*3/uL (ref 1.0–3.6)
Lymphocyte %: 15.4 %
MCH: 29.1 pg (ref 26.0–34.0)
MCHC: 33.8 g/dL (ref 32.0–36.0)
MCV: 86 fL (ref 80–100)
MONOS PCT: 12.8 %
Monocyte #: 1.1 x10 3/mm — ABNORMAL HIGH (ref 0.2–1.0)
Neutrophil #: 5.8 10*3/uL (ref 1.4–6.5)
Neutrophil %: 68.1 %
Platelet: 118 10*3/uL — ABNORMAL LOW (ref 150–440)
RBC: 2.9 10*6/uL — ABNORMAL LOW (ref 4.40–5.90)
RDW: 14.2 % (ref 11.5–14.5)
WBC: 8.6 10*3/uL (ref 3.8–10.6)

## 2014-06-25 LAB — CULTURE, BLOOD (SINGLE)

## 2014-06-26 LAB — BASIC METABOLIC PANEL
Anion Gap: 9 (ref 7–16)
BUN: 46 mg/dL — ABNORMAL HIGH (ref 7–18)
CALCIUM: 8.3 mg/dL — AB (ref 8.5–10.1)
Chloride: 109 mmol/L — ABNORMAL HIGH (ref 98–107)
Co2: 20 mmol/L — ABNORMAL LOW (ref 21–32)
Creatinine: 2.68 mg/dL — ABNORMAL HIGH (ref 0.60–1.30)
EGFR (African American): 32 — ABNORMAL LOW
EGFR (Non-African Amer.): 27 — ABNORMAL LOW
GLUCOSE: 123 mg/dL — AB (ref 65–99)
Osmolality: 289 (ref 275–301)
POTASSIUM: 4.2 mmol/L (ref 3.5–5.1)
SODIUM: 138 mmol/L (ref 136–145)

## 2014-06-27 LAB — BASIC METABOLIC PANEL
Anion Gap: 7 (ref 7–16)
BUN: 42 mg/dL — ABNORMAL HIGH (ref 7–18)
CREATININE: 2.5 mg/dL — AB (ref 0.60–1.30)
Calcium, Total: 8.1 mg/dL — ABNORMAL LOW (ref 8.5–10.1)
Chloride: 110 mmol/L — ABNORMAL HIGH (ref 98–107)
Co2: 23 mmol/L (ref 21–32)
EGFR (Non-African Amer.): 30 — ABNORMAL LOW
GFR CALC AF AMER: 35 — AB
Glucose: 116 mg/dL — ABNORMAL HIGH (ref 65–99)
OSMOLALITY: 291 (ref 275–301)
Potassium: 3.8 mmol/L (ref 3.5–5.1)
SODIUM: 140 mmol/L (ref 136–145)

## 2014-06-27 LAB — HEMOGLOBIN: HGB: 8.6 g/dL — ABNORMAL LOW (ref 13.0–18.0)

## 2014-06-28 LAB — VANCOMYCIN, TROUGH: Vancomycin, Trough: 26 ug/mL (ref 10–20)

## 2014-06-28 LAB — BASIC METABOLIC PANEL
Anion Gap: 8 (ref 7–16)
BUN: 36 mg/dL — ABNORMAL HIGH (ref 7–18)
Calcium, Total: 7.9 mg/dL — ABNORMAL LOW (ref 8.5–10.1)
Chloride: 109 mmol/L — ABNORMAL HIGH (ref 98–107)
Co2: 22 mmol/L (ref 21–32)
Creatinine: 2.24 mg/dL — ABNORMAL HIGH (ref 0.60–1.30)
EGFR (African American): 40 — ABNORMAL LOW
GFR CALC NON AF AMER: 34 — AB
GLUCOSE: 131 mg/dL — AB (ref 65–99)
Osmolality: 288 (ref 275–301)
Potassium: 3.8 mmol/L (ref 3.5–5.1)
Sodium: 139 mmol/L (ref 136–145)

## 2014-06-28 LAB — CULTURE, BLOOD (SINGLE)

## 2014-06-29 LAB — CBC WITH DIFFERENTIAL/PLATELET
Comment - H1-Com2: NORMAL
Eosinophil: 6 %
HCT: 25.1 % — ABNORMAL LOW (ref 40.0–52.0)
HGB: 8.4 g/dL — ABNORMAL LOW (ref 13.0–18.0)
LYMPHS PCT: 16 %
MCH: 28.8 pg (ref 26.0–34.0)
MCHC: 33.3 g/dL (ref 32.0–36.0)
MCV: 86 fL (ref 80–100)
Metamyelocyte: 1 %
Monocytes: 4 %
Platelet: 153 10*3/uL (ref 150–440)
RBC: 2.91 10*6/uL — ABNORMAL LOW (ref 4.40–5.90)
RDW: 13.8 % (ref 11.5–14.5)
Segmented Neutrophils: 73 %
WBC: 11.2 10*3/uL — ABNORMAL HIGH (ref 3.8–10.6)

## 2014-06-29 LAB — BASIC METABOLIC PANEL
ANION GAP: 10 (ref 7–16)
BUN: 31 mg/dL — ABNORMAL HIGH (ref 7–18)
CALCIUM: 7.9 mg/dL — AB (ref 8.5–10.1)
CHLORIDE: 109 mmol/L — AB (ref 98–107)
Co2: 21 mmol/L (ref 21–32)
Creatinine: 2.08 mg/dL — ABNORMAL HIGH (ref 0.60–1.30)
GFR CALC AF AMER: 43 — AB
GFR CALC NON AF AMER: 37 — AB
Glucose: 101 mg/dL — ABNORMAL HIGH (ref 65–99)
Osmolality: 286 (ref 275–301)
Potassium: 3.7 mmol/L (ref 3.5–5.1)
Sodium: 140 mmol/L (ref 136–145)

## 2014-06-29 LAB — WOUND CULTURE

## 2014-06-29 LAB — VANCOMYCIN, TROUGH
VANCOMYCIN, TROUGH: 22 ug/mL — AB (ref 10–20)
Vancomycin, Trough: 17 ug/mL (ref 10–20)

## 2014-06-30 LAB — BASIC METABOLIC PANEL
ANION GAP: 10 (ref 7–16)
BUN: 26 mg/dL — ABNORMAL HIGH (ref 7–18)
CREATININE: 2.12 mg/dL — AB (ref 0.60–1.30)
Calcium, Total: 7.7 mg/dL — ABNORMAL LOW (ref 8.5–10.1)
Chloride: 110 mmol/L — ABNORMAL HIGH (ref 98–107)
Co2: 21 mmol/L (ref 21–32)
EGFR (African American): 42 — ABNORMAL LOW
EGFR (Non-African Amer.): 36 — ABNORMAL LOW
GLUCOSE: 94 mg/dL (ref 65–99)
Osmolality: 286 (ref 275–301)
Potassium: 3.7 mmol/L (ref 3.5–5.1)
SODIUM: 141 mmol/L (ref 136–145)

## 2014-07-03 LAB — CULTURE, BLOOD (SINGLE)

## 2014-07-20 ENCOUNTER — Ambulatory Visit: Payer: Self-pay | Admitting: Internal Medicine

## 2014-07-20 DIAGNOSIS — I38 Endocarditis, valve unspecified: Secondary | ICD-10-CM

## 2014-07-20 HISTORY — DX: Endocarditis, valve unspecified: I38

## 2014-08-04 ENCOUNTER — Inpatient Hospital Stay: Payer: Self-pay | Admitting: Internal Medicine

## 2014-08-04 LAB — COMPREHENSIVE METABOLIC PANEL
Albumin: 2.1 g/dL — ABNORMAL LOW (ref 3.4–5.0)
Alkaline Phosphatase: 111 U/L
Anion Gap: 12 (ref 7–16)
BILIRUBIN TOTAL: 1.3 mg/dL — AB (ref 0.2–1.0)
BUN: 87 mg/dL — ABNORMAL HIGH (ref 7–18)
CREATININE: 5.71 mg/dL — AB (ref 0.60–1.30)
Calcium, Total: 8 mg/dL — ABNORMAL LOW (ref 8.5–10.1)
Chloride: 110 mmol/L — ABNORMAL HIGH (ref 98–107)
Co2: 17 mmol/L — ABNORMAL LOW (ref 21–32)
EGFR (Non-African Amer.): 11 — ABNORMAL LOW
GFR CALC AF AMER: 13 — AB
Glucose: 167 mg/dL — ABNORMAL HIGH (ref 65–99)
OSMOLALITY: 308 (ref 275–301)
Potassium: 4.4 mmol/L (ref 3.5–5.1)
SGOT(AST): 27 U/L (ref 15–37)
SGPT (ALT): 14 U/L
Sodium: 139 mmol/L (ref 136–145)
Total Protein: 6.2 g/dL — ABNORMAL LOW (ref 6.4–8.2)

## 2014-08-04 LAB — CBC WITH DIFFERENTIAL/PLATELET
BASOS ABS: 0.1 10*3/uL (ref 0.0–0.1)
Basophil %: 0.4 %
Eosinophil #: 0 10*3/uL (ref 0.0–0.7)
Eosinophil %: 0 %
HCT: 19.1 % — AB (ref 40.0–52.0)
HGB: 6.1 g/dL — AB (ref 13.0–18.0)
LYMPHS PCT: 2.7 %
Lymphocyte #: 0.6 10*3/uL — ABNORMAL LOW (ref 1.0–3.6)
MCH: 26.8 pg (ref 26.0–34.0)
MCHC: 32 g/dL (ref 32.0–36.0)
MCV: 84 fL (ref 80–100)
MONOS PCT: 2.8 %
Monocyte #: 0.6 x10 3/mm (ref 0.2–1.0)
NEUTROS ABS: 20.6 10*3/uL — AB (ref 1.4–6.5)
Neutrophil %: 94.1 %
Platelet: 139 10*3/uL — ABNORMAL LOW (ref 150–440)
RBC: 2.28 10*6/uL — ABNORMAL LOW (ref 4.40–5.90)
RDW: 15.4 % — ABNORMAL HIGH (ref 11.5–14.5)
WBC: 21.9 10*3/uL — AB (ref 3.8–10.6)

## 2014-08-04 LAB — IRON AND TIBC
IRON SATURATION: 12 %
Iron Bind.Cap.(Total): 119 ug/dL — ABNORMAL LOW (ref 250–450)
Iron: 14 ug/dL — ABNORMAL LOW (ref 65–175)
Unbound Iron-Bind.Cap.: 105 ug/dL

## 2014-08-04 LAB — SEDIMENTATION RATE: ERYTHROCYTE SED RATE: 29 mm/h — AB (ref 0–15)

## 2014-08-04 LAB — CK TOTAL AND CKMB (NOT AT ARMC)
CK, TOTAL: 31 U/L — AB
CK-MB: 1 ng/mL (ref 0.5–3.6)

## 2014-08-04 LAB — TROPONIN I
TROPONIN-I: 0.89 ng/mL — AB
Troponin-I: 1.1 ng/mL — ABNORMAL HIGH
Troponin-I: 1.3 ng/mL — ABNORMAL HIGH

## 2014-08-04 LAB — FERRITIN: Ferritin (ARMC): 1564 ng/mL — ABNORMAL HIGH (ref 8–388)

## 2014-08-05 ENCOUNTER — Encounter: Payer: Self-pay | Admitting: Nurse Practitioner

## 2014-08-05 ENCOUNTER — Other Ambulatory Visit: Payer: Self-pay | Admitting: Nurse Practitioner

## 2014-08-05 DIAGNOSIS — R7881 Bacteremia: Secondary | ICD-10-CM

## 2014-08-05 DIAGNOSIS — R7989 Other specified abnormal findings of blood chemistry: Secondary | ICD-10-CM

## 2014-08-05 DIAGNOSIS — I82409 Acute embolism and thrombosis of unspecified deep veins of unspecified lower extremity: Secondary | ICD-10-CM

## 2014-08-05 LAB — CBC WITH DIFFERENTIAL/PLATELET
BASOS ABS: 0 10*3/uL (ref 0.0–0.1)
Basophil #: 0.1 10*3/uL (ref 0.0–0.1)
Basophil %: 0.2 %
Basophil %: 0.2 %
Eosinophil #: 0 10*3/uL (ref 0.0–0.7)
Eosinophil #: 0.1 10*3/uL (ref 0.0–0.7)
Eosinophil %: 0.2 %
Eosinophil %: 0.4 %
HCT: 19.4 % — ABNORMAL LOW (ref 40.0–52.0)
HCT: 22.3 % — ABNORMAL LOW (ref 40.0–52.0)
HGB: 6.1 g/dL — AB (ref 13.0–18.0)
HGB: 6.9 g/dL — ABNORMAL LOW (ref 13.0–18.0)
LYMPHS ABS: 1.1 10*3/uL (ref 1.0–3.6)
LYMPHS PCT: 5 %
Lymphocyte #: 0.9 10*3/uL — ABNORMAL LOW (ref 1.0–3.6)
Lymphocyte %: 4 %
MCH: 26.5 pg (ref 26.0–34.0)
MCH: 26.6 pg (ref 26.0–34.0)
MCHC: 31.4 g/dL — ABNORMAL LOW (ref 32.0–36.0)
MCHC: 31.1 g/dL — ABNORMAL LOW (ref 32.0–36.0)
MCV: 84 fL (ref 80–100)
MCV: 86 fL (ref 80–100)
MONOS PCT: 6.5 %
Monocyte #: 1.3 x10 3/mm — ABNORMAL HIGH (ref 0.2–1.0)
Monocyte #: 1.4 x10 3/mm — ABNORMAL HIGH (ref 0.2–1.0)
Monocyte %: 6.1 %
Neutrophil #: 18.6 10*3/uL — ABNORMAL HIGH (ref 1.4–6.5)
Neutrophil #: 19.1 10*3/uL — ABNORMAL HIGH (ref 1.4–6.5)
Neutrophil %: 88.5 %
Neutrophil %: 88.9 %
Platelet: 109 10*3/uL — ABNORMAL LOW (ref 150–440)
Platelet: 112 10*3/uL — ABNORMAL LOW (ref 150–440)
RBC: 2.29 10*6/uL — ABNORMAL LOW (ref 4.40–5.90)
RBC: 2.61 10*6/uL — AB (ref 4.40–5.90)
RDW: 15.6 % — AB (ref 11.5–14.5)
RDW: 16 % — AB (ref 11.5–14.5)
WBC: 21 10*3/uL — ABNORMAL HIGH (ref 3.8–10.6)
WBC: 21.6 10*3/uL — ABNORMAL HIGH (ref 3.8–10.6)

## 2014-08-05 LAB — BASIC METABOLIC PANEL
ANION GAP: 12 (ref 7–16)
BUN: 96 mg/dL — ABNORMAL HIGH (ref 7–18)
CREATININE: 5.82 mg/dL — AB (ref 0.60–1.30)
Calcium, Total: 8.1 mg/dL — ABNORMAL LOW (ref 8.5–10.1)
Chloride: 113 mmol/L — ABNORMAL HIGH (ref 98–107)
Co2: 15 mmol/L — ABNORMAL LOW (ref 21–32)
EGFR (African American): 12 — ABNORMAL LOW
EGFR (Non-African Amer.): 11 — ABNORMAL LOW
Glucose: 149 mg/dL — ABNORMAL HIGH (ref 65–99)
Osmolality: 312 (ref 275–301)
Potassium: 4.3 mmol/L (ref 3.5–5.1)
SODIUM: 140 mmol/L (ref 136–145)

## 2014-08-05 LAB — URINALYSIS, COMPLETE
Bilirubin,UR: NEGATIVE
Glucose,UR: NEGATIVE mg/dL (ref 0–75)
Ketone: NEGATIVE
Nitrite: NEGATIVE
Ph: 5 (ref 4.5–8.0)
Protein: 30
Specific Gravity: 1.017 (ref 1.003–1.030)
Squamous Epithelial: <1
WBC UR: 22 /HPF (ref 0–5)

## 2014-08-05 LAB — LIPID PANEL
Cholesterol: 67 mg/dL (ref 0–200)
HDL: 5 mg/dL — AB (ref 40–60)
Ldl Cholesterol, Calc: 28 mg/dL (ref 0–100)
TRIGLYCERIDES: 170 mg/dL (ref 0–200)
VLDL Cholesterol, Calc: 34 mg/dL (ref 5–40)

## 2014-08-05 LAB — CLOSTRIDIUM DIFFICILE(ARMC)

## 2014-08-05 LAB — OCCULT BLOOD X 1 CARD TO LAB, STOOL: Occult Blood, Feces: NEGATIVE

## 2014-08-06 LAB — BASIC METABOLIC PANEL
Anion Gap: 9 (ref 7–16)
BUN: 108 mg/dL — AB (ref 7–18)
Calcium, Total: 8 mg/dL — ABNORMAL LOW (ref 8.5–10.1)
Chloride: 110 mmol/L — ABNORMAL HIGH (ref 98–107)
Co2: 19 mmol/L — ABNORMAL LOW (ref 21–32)
Creatinine: 6.14 mg/dL — ABNORMAL HIGH (ref 0.60–1.30)
EGFR (African American): 12 — ABNORMAL LOW
EGFR (Non-African Amer.): 10 — ABNORMAL LOW
Glucose: 159 mg/dL — ABNORMAL HIGH (ref 65–99)
OSMOLALITY: 313 (ref 275–301)
Potassium: 4.5 mmol/L (ref 3.5–5.1)
Sodium: 138 mmol/L (ref 136–145)

## 2014-08-06 LAB — CBC WITH DIFFERENTIAL/PLATELET
BASOS ABS: 0 10*3/uL (ref 0.0–0.1)
Basophil %: 0.1 %
Eosinophil #: 0.1 10*3/uL (ref 0.0–0.7)
Eosinophil %: 0.4 %
HCT: 20.5 % — ABNORMAL LOW (ref 40.0–52.0)
HGB: 6.7 g/dL — AB (ref 13.0–18.0)
LYMPHS ABS: 0.9 10*3/uL — AB (ref 1.0–3.6)
LYMPHS PCT: 4.2 %
MCH: 27.4 pg (ref 26.0–34.0)
MCHC: 32.4 g/dL (ref 32.0–36.0)
MCV: 84 fL (ref 80–100)
MONO ABS: 1.4 x10 3/mm — AB (ref 0.2–1.0)
MONOS PCT: 6.3 %
NEUTROS PCT: 89 %
Neutrophil #: 19.6 10*3/uL — ABNORMAL HIGH (ref 1.4–6.5)
Platelet: 110 10*3/uL — ABNORMAL LOW (ref 150–440)
RBC: 2.43 10*6/uL — ABNORMAL LOW (ref 4.40–5.90)
RDW: 15.5 % — ABNORMAL HIGH (ref 11.5–14.5)
WBC: 22.1 10*3/uL — AB (ref 3.8–10.6)

## 2014-08-06 LAB — HEMOGLOBIN: HGB: 7.5 g/dL — ABNORMAL LOW (ref 13.0–18.0)

## 2014-08-06 LAB — HEMATOCRIT: HCT: 22.8 % — AB (ref 40.0–52.0)

## 2014-08-06 LAB — URINE CULTURE

## 2014-08-06 LAB — LACTATE DEHYDROGENASE: LDH: 279 U/L — ABNORMAL HIGH (ref 85–241)

## 2014-08-06 LAB — CK: CK, Total: 15 U/L — ABNORMAL LOW

## 2014-08-07 LAB — CBC WITH DIFFERENTIAL/PLATELET
Basophil: 1 %
Eosinophil: 2 %
HCT: 21.9 % — ABNORMAL LOW (ref 40.0–52.0)
HGB: 7.1 g/dL — AB (ref 13.0–18.0)
LYMPHS PCT: 6 %
MCH: 27.5 pg (ref 26.0–34.0)
MCHC: 32.7 g/dL (ref 32.0–36.0)
MCV: 84 fL (ref 80–100)
Metamyelocyte: 1 %
Monocytes: 3 %
Myelocyte: 1 %
NRBC/100 WBC: 1 /
PLATELETS: 131 10*3/uL — AB (ref 150–440)
RBC: 2.6 10*6/uL — ABNORMAL LOW (ref 4.40–5.90)
RDW: 15.7 % — ABNORMAL HIGH (ref 11.5–14.5)
Segmented Neutrophils: 86 %
WBC: 23.8 10*3/uL — ABNORMAL HIGH (ref 3.8–10.6)

## 2014-08-07 LAB — BASIC METABOLIC PANEL
Anion Gap: 13 (ref 7–16)
BUN: 123 mg/dL — ABNORMAL HIGH (ref 7–18)
CHLORIDE: 111 mmol/L — AB (ref 98–107)
CREATININE: 6.34 mg/dL — AB (ref 0.60–1.30)
Calcium, Total: 8.3 mg/dL — ABNORMAL LOW (ref 8.5–10.1)
Co2: 15 mmol/L — ABNORMAL LOW (ref 21–32)
EGFR (African American): 11 — ABNORMAL LOW
GFR CALC NON AF AMER: 10 — AB
Glucose: 188 mg/dL — ABNORMAL HIGH (ref 65–99)
OSMOLALITY: 322 (ref 275–301)
Potassium: 4.8 mmol/L (ref 3.5–5.1)
Sodium: 139 mmol/L (ref 136–145)

## 2014-08-07 LAB — RETICULOCYTES
ABSOLUTE RETIC COUNT: 0.0568 10*6/uL (ref 0.019–0.186)
Reticulocyte: 2.17 % (ref 0.4–3.1)

## 2014-08-07 LAB — CULTURE, BLOOD (SINGLE)

## 2014-08-08 LAB — BASIC METABOLIC PANEL
Anion Gap: 10 (ref 7–16)
BUN: 130 mg/dL — AB (ref 7–18)
CREATININE: 7.02 mg/dL — AB (ref 0.60–1.30)
Calcium, Total: 8.1 mg/dL — ABNORMAL LOW (ref 8.5–10.1)
Chloride: 109 mmol/L — ABNORMAL HIGH (ref 98–107)
Co2: 17 mmol/L — ABNORMAL LOW (ref 21–32)
EGFR (African American): 10 — ABNORMAL LOW
EGFR (Non-African Amer.): 9 — ABNORMAL LOW
Glucose: 197 mg/dL — ABNORMAL HIGH (ref 65–99)
OSMOLALITY: 319 (ref 275–301)
POTASSIUM: 5.1 mmol/L (ref 3.5–5.1)
Sodium: 136 mmol/L (ref 136–145)

## 2014-08-08 LAB — CULTURE, BLOOD (SINGLE)

## 2014-08-08 LAB — HEMOGLOBIN: HGB: 6.8 g/dL — AB (ref 13.0–18.0)

## 2014-08-09 LAB — BASIC METABOLIC PANEL
Anion Gap: 9 (ref 7–16)
BUN: 126 mg/dL — ABNORMAL HIGH (ref 7–18)
CALCIUM: 8.3 mg/dL — AB (ref 8.5–10.1)
CO2: 19 mmol/L — AB (ref 21–32)
Chloride: 109 mmol/L — ABNORMAL HIGH (ref 98–107)
Creatinine: 7.16 mg/dL — ABNORMAL HIGH (ref 0.60–1.30)
EGFR (Non-African Amer.): 8 — ABNORMAL LOW
GFR CALC AF AMER: 10 — AB
Glucose: 157 mg/dL — ABNORMAL HIGH (ref 65–99)
Osmolality: 318 (ref 275–301)
Potassium: 5.2 mmol/L — ABNORMAL HIGH (ref 3.5–5.1)
Sodium: 137 mmol/L (ref 136–145)

## 2014-08-09 LAB — CBC WITH DIFFERENTIAL/PLATELET
BASOS ABS: 0 10*3/uL (ref 0.0–0.1)
BASOS PCT: 0.2 %
EOS PCT: 0.4 %
Eosinophil #: 0.1 10*3/uL (ref 0.0–0.7)
HCT: 21 % — ABNORMAL LOW (ref 40.0–52.0)
HGB: 6.5 g/dL — AB (ref 13.0–18.0)
LYMPHS ABS: 1.2 10*3/uL (ref 1.0–3.6)
Lymphocyte %: 4 %
Lymphocytes: 6 %
MCH: 26.9 pg (ref 26.0–34.0)
MCHC: 31.1 g/dL — AB (ref 32.0–36.0)
MCV: 87 fL (ref 80–100)
MONO ABS: 0.8 x10 3/mm (ref 0.2–1.0)
Monocyte %: 2.8 %
Monocytes: 2 %
Neutrophil #: 27.9 10*3/uL — ABNORMAL HIGH (ref 1.4–6.5)
Neutrophil %: 92.6 %
Platelet: 149 10*3/uL — ABNORMAL LOW (ref 150–440)
RBC: 2.42 10*6/uL — AB (ref 4.40–5.90)
RDW: 16.2 % — AB (ref 11.5–14.5)
Segmented Neutrophils: 92 %
WBC: 30.1 10*3/uL — ABNORMAL HIGH (ref 3.8–10.6)

## 2014-08-09 LAB — PHOSPHORUS: PHOSPHORUS: 5.2 mg/dL — AB (ref 2.5–4.9)

## 2014-08-10 LAB — CBC WITH DIFFERENTIAL/PLATELET
Basophil #: 0.1 10*3/uL (ref 0.0–0.1)
Basophil %: 0.4 %
Eosinophil #: 0.2 10*3/uL (ref 0.0–0.7)
Eosinophil %: 1.1 %
HCT: 22.3 % — ABNORMAL LOW (ref 40.0–52.0)
HGB: 7 g/dL — AB (ref 13.0–18.0)
Lymphocyte #: 1.5 10*3/uL (ref 1.0–3.6)
Lymphocyte %: 6.8 %
MCH: 27.3 pg (ref 26.0–34.0)
MCHC: 31.4 g/dL — AB (ref 32.0–36.0)
MCV: 87 fL (ref 80–100)
Monocyte #: 1 x10 3/mm (ref 0.2–1.0)
Monocyte %: 4.5 %
Neutrophil #: 19.1 10*3/uL — ABNORMAL HIGH (ref 1.4–6.5)
Neutrophil %: 87.2 %
PLATELETS: 160 10*3/uL (ref 150–440)
RBC: 2.56 10*6/uL — ABNORMAL LOW (ref 4.40–5.90)
RDW: 16.2 % — ABNORMAL HIGH (ref 11.5–14.5)
WBC: 22 10*3/uL — AB (ref 3.8–10.6)

## 2014-08-10 LAB — BASIC METABOLIC PANEL
ANION GAP: 9 (ref 7–16)
BUN: 101 mg/dL — ABNORMAL HIGH (ref 7–18)
Calcium, Total: 8 mg/dL — ABNORMAL LOW (ref 8.5–10.1)
Chloride: 106 mmol/L (ref 98–107)
Co2: 22 mmol/L (ref 21–32)
Creatinine: 6.24 mg/dL — ABNORMAL HIGH (ref 0.60–1.30)
EGFR (Non-African Amer.): 10 — ABNORMAL LOW
GFR CALC AF AMER: 11 — AB
GLUCOSE: 206 mg/dL — AB (ref 65–99)
OSMOLALITY: 311 (ref 275–301)
Potassium: 4.8 mmol/L (ref 3.5–5.1)
SODIUM: 137 mmol/L (ref 136–145)

## 2014-08-10 LAB — PHOSPHORUS: PHOSPHORUS: 4 mg/dL (ref 2.5–4.9)

## 2014-08-10 LAB — FOLATE: FOLIC ACID: 27.7 ng/mL (ref 3.1–100.0)

## 2014-08-11 ENCOUNTER — Inpatient Hospital Stay (HOSPITAL_COMMUNITY)
Admission: AD | Admit: 2014-08-11 | Discharge: 2014-08-20 | DRG: 288 | Disposition: A | Discharge status: Skilled Nursing Facility | Payer: Medicare Other | Source: Other Acute Inpatient Hospital | Attending: Internal Medicine | Admitting: Internal Medicine

## 2014-08-11 DIAGNOSIS — Z6841 Body Mass Index (BMI) 40.0 and over, adult: Secondary | ICD-10-CM

## 2014-08-11 DIAGNOSIS — I83009 Varicose veins of unspecified lower extremity with ulcer of unspecified site: Secondary | ICD-10-CM | POA: Diagnosis present

## 2014-08-11 DIAGNOSIS — I998 Other disorder of circulatory system: Secondary | ICD-10-CM | POA: Diagnosis not present

## 2014-08-11 DIAGNOSIS — R609 Edema, unspecified: Secondary | ICD-10-CM | POA: Diagnosis not present

## 2014-08-11 DIAGNOSIS — Z8614 Personal history of Methicillin resistant Staphylococcus aureus infection: Secondary | ICD-10-CM | POA: Diagnosis not present

## 2014-08-11 DIAGNOSIS — E119 Type 2 diabetes mellitus without complications: Secondary | ICD-10-CM | POA: Diagnosis present

## 2014-08-11 DIAGNOSIS — R34 Anuria and oliguria: Secondary | ICD-10-CM | POA: Diagnosis present

## 2014-08-11 DIAGNOSIS — I351 Nonrheumatic aortic (valve) insufficiency: Secondary | ICD-10-CM

## 2014-08-11 DIAGNOSIS — E875 Hyperkalemia: Secondary | ICD-10-CM | POA: Diagnosis present

## 2014-08-11 DIAGNOSIS — Z515 Encounter for palliative care: Secondary | ICD-10-CM

## 2014-08-11 DIAGNOSIS — E872 Acidosis, unspecified: Secondary | ICD-10-CM

## 2014-08-11 DIAGNOSIS — Z7401 Bed confinement status: Secondary | ICD-10-CM | POA: Diagnosis not present

## 2014-08-11 DIAGNOSIS — L03115 Cellulitis of right lower limb: Secondary | ICD-10-CM | POA: Diagnosis present

## 2014-08-11 DIAGNOSIS — Z86718 Personal history of other venous thrombosis and embolism: Secondary | ICD-10-CM

## 2014-08-11 DIAGNOSIS — Z89512 Acquired absence of left leg below knee: Secondary | ICD-10-CM | POA: Diagnosis not present

## 2014-08-11 DIAGNOSIS — M109 Gout, unspecified: Secondary | ICD-10-CM | POA: Diagnosis present

## 2014-08-11 DIAGNOSIS — I330 Acute and subacute infective endocarditis: Principal | ICD-10-CM

## 2014-08-11 DIAGNOSIS — Z794 Long term (current) use of insulin: Secondary | ICD-10-CM

## 2014-08-11 DIAGNOSIS — L02419 Cutaneous abscess of limb, unspecified: Secondary | ICD-10-CM | POA: Diagnosis not present

## 2014-08-11 DIAGNOSIS — I872 Venous insufficiency (chronic) (peripheral): Secondary | ICD-10-CM | POA: Diagnosis present

## 2014-08-11 DIAGNOSIS — M9080 Osteopathy in diseases classified elsewhere, unspecified site: Secondary | ICD-10-CM | POA: Diagnosis not present

## 2014-08-11 DIAGNOSIS — I38 Endocarditis, valve unspecified: Secondary | ICD-10-CM | POA: Insufficient documentation

## 2014-08-11 DIAGNOSIS — L97919 Non-pressure chronic ulcer of unspecified part of right lower leg with unspecified severity: Secondary | ICD-10-CM

## 2014-08-11 DIAGNOSIS — L97909 Non-pressure chronic ulcer of unspecified part of unspecified lower leg with unspecified severity: Secondary | ICD-10-CM | POA: Diagnosis present

## 2014-08-11 DIAGNOSIS — L0889 Other specified local infections of the skin and subcutaneous tissue: Secondary | ICD-10-CM | POA: Diagnosis present

## 2014-08-11 DIAGNOSIS — D649 Anemia, unspecified: Secondary | ICD-10-CM | POA: Diagnosis present

## 2014-08-11 DIAGNOSIS — I878 Other specified disorders of veins: Secondary | ICD-10-CM

## 2014-08-11 DIAGNOSIS — I120 Hypertensive chronic kidney disease with stage 5 chronic kidney disease or end stage renal disease: Secondary | ICD-10-CM | POA: Diagnosis present

## 2014-08-11 DIAGNOSIS — Z79899 Other long term (current) drug therapy: Secondary | ICD-10-CM | POA: Diagnosis not present

## 2014-08-11 DIAGNOSIS — G629 Polyneuropathy, unspecified: Secondary | ICD-10-CM | POA: Diagnosis present

## 2014-08-11 DIAGNOSIS — E1169 Type 2 diabetes mellitus with other specified complication: Secondary | ICD-10-CM | POA: Diagnosis not present

## 2014-08-11 DIAGNOSIS — N184 Chronic kidney disease, stage 4 (severe): Secondary | ICD-10-CM

## 2014-08-11 DIAGNOSIS — N189 Chronic kidney disease, unspecified: Secondary | ICD-10-CM | POA: Diagnosis present

## 2014-08-11 DIAGNOSIS — G9340 Encephalopathy, unspecified: Secondary | ICD-10-CM

## 2014-08-11 DIAGNOSIS — K219 Gastro-esophageal reflux disease without esophagitis: Secondary | ICD-10-CM | POA: Diagnosis present

## 2014-08-11 DIAGNOSIS — A4102 Sepsis due to Methicillin resistant Staphylococcus aureus: Secondary | ICD-10-CM | POA: Diagnosis present

## 2014-08-11 DIAGNOSIS — I739 Peripheral vascular disease, unspecified: Secondary | ICD-10-CM | POA: Diagnosis present

## 2014-08-11 DIAGNOSIS — E11628 Type 2 diabetes mellitus with other skin complications: Secondary | ICD-10-CM

## 2014-08-11 DIAGNOSIS — Z7901 Long term (current) use of anticoagulants: Secondary | ICD-10-CM

## 2014-08-11 DIAGNOSIS — B9562 Methicillin resistant Staphylococcus aureus infection as the cause of diseases classified elsewhere: Secondary | ICD-10-CM

## 2014-08-11 DIAGNOSIS — L97921 Non-pressure chronic ulcer of unspecified part of left lower leg limited to breakdown of skin: Secondary | ICD-10-CM

## 2014-08-11 DIAGNOSIS — S88119A Complete traumatic amputation at level between knee and ankle, unspecified lower leg, initial encounter: Secondary | ICD-10-CM | POA: Diagnosis not present

## 2014-08-11 DIAGNOSIS — F3289 Other specified depressive episodes: Secondary | ICD-10-CM | POA: Diagnosis not present

## 2014-08-11 DIAGNOSIS — R7881 Bacteremia: Secondary | ICD-10-CM

## 2014-08-11 DIAGNOSIS — E1141 Type 2 diabetes mellitus with diabetic mononeuropathy: Secondary | ICD-10-CM

## 2014-08-11 DIAGNOSIS — Z7189 Other specified counseling: Secondary | ICD-10-CM

## 2014-08-11 DIAGNOSIS — F329 Major depressive disorder, single episode, unspecified: Secondary | ICD-10-CM | POA: Diagnosis present

## 2014-08-11 DIAGNOSIS — I359 Nonrheumatic aortic valve disorder, unspecified: Secondary | ICD-10-CM | POA: Diagnosis not present

## 2014-08-11 DIAGNOSIS — N186 End stage renal disease: Secondary | ICD-10-CM

## 2014-08-11 DIAGNOSIS — M869 Osteomyelitis, unspecified: Secondary | ICD-10-CM | POA: Diagnosis not present

## 2014-08-11 DIAGNOSIS — E1149 Type 2 diabetes mellitus with other diabetic neurological complication: Secondary | ICD-10-CM

## 2014-08-11 DIAGNOSIS — L089 Local infection of the skin and subcutaneous tissue, unspecified: Secondary | ICD-10-CM

## 2014-08-11 DIAGNOSIS — E1129 Type 2 diabetes mellitus with other diabetic kidney complication: Secondary | ICD-10-CM

## 2014-08-11 DIAGNOSIS — I358 Other nonrheumatic aortic valve disorders: Secondary | ICD-10-CM

## 2014-08-11 DIAGNOSIS — L97914 Non-pressure chronic ulcer of unspecified part of right lower leg with necrosis of bone: Secondary | ICD-10-CM

## 2014-08-11 DIAGNOSIS — Z992 Dependence on renal dialysis: Secondary | ICD-10-CM | POA: Diagnosis not present

## 2014-08-11 DIAGNOSIS — A4902 Methicillin resistant Staphylococcus aureus infection, unspecified site: Secondary | ICD-10-CM | POA: Diagnosis not present

## 2014-08-11 DIAGNOSIS — R531 Weakness: Secondary | ICD-10-CM

## 2014-08-11 DIAGNOSIS — G609 Hereditary and idiopathic neuropathy, unspecified: Secondary | ICD-10-CM | POA: Diagnosis not present

## 2014-08-11 DIAGNOSIS — I10 Essential (primary) hypertension: Secondary | ICD-10-CM

## 2014-08-11 HISTORY — DX: Endocarditis, valve unspecified: I38

## 2014-08-11 HISTORY — DX: Shortness of breath: R06.02

## 2014-08-11 LAB — CBC WITH DIFFERENTIAL/PLATELET
Basophils Absolute: 0 10*3/uL (ref 0.0–0.1)
Basophils Relative: 0 % (ref 0–1)
Eosinophils Absolute: 0.2 10*3/uL (ref 0.0–0.7)
Eosinophils Relative: 1 % (ref 0–5)
HCT: 20.7 % — ABNORMAL LOW (ref 39.0–52.0)
HEMOGLOBIN: 7.1 g/dL — AB (ref 13.0–17.0)
LYMPHS ABS: 1.6 10*3/uL (ref 0.7–4.0)
Lymphocytes Relative: 7 % — ABNORMAL LOW (ref 12–46)
MCH: 28.6 pg (ref 26.0–34.0)
MCHC: 34.3 g/dL (ref 30.0–36.0)
MCV: 83.5 fL (ref 78.0–100.0)
MONOS PCT: 6 % (ref 3–12)
Monocytes Absolute: 1.2 10*3/uL — ABNORMAL HIGH (ref 0.1–1.0)
NEUTROS ABS: 18.5 10*3/uL — AB (ref 1.7–7.7)
NEUTROS PCT: 86 % — AB (ref 43–77)
Platelets: 223 10*3/uL (ref 150–400)
RBC: 2.48 MIL/uL — AB (ref 4.22–5.81)
RDW: 17.2 % — ABNORMAL HIGH (ref 11.5–15.5)
WBC: 21.5 10*3/uL — AB (ref 4.0–10.5)

## 2014-08-11 LAB — COMPREHENSIVE METABOLIC PANEL
ALBUMIN: 2 g/dL — AB (ref 3.5–5.2)
ALK PHOS: 129 U/L — AB (ref 39–117)
ALT: 44 U/L (ref 0–53)
AST: 90 U/L — AB (ref 0–37)
Anion gap: 16 — ABNORMAL HIGH (ref 5–15)
BUN: 79 mg/dL — ABNORMAL HIGH (ref 6–23)
CALCIUM: 8.1 mg/dL — AB (ref 8.4–10.5)
CO2: 23 mEq/L (ref 19–32)
Chloride: 101 mEq/L (ref 96–112)
Creatinine, Ser: 5.51 mg/dL — ABNORMAL HIGH (ref 0.50–1.35)
GFR calc Af Amer: 13 mL/min — ABNORMAL LOW (ref >90)
GFR calc non Af Amer: 11 mL/min — ABNORMAL LOW (ref >90)
GLUCOSE: 176 mg/dL — AB (ref 70–99)
POTASSIUM: 5.3 meq/L (ref 3.7–5.3)
SODIUM: 140 meq/L (ref 137–147)
Total Bilirubin: 1.3 mg/dL — ABNORMAL HIGH (ref 0.3–1.2)
Total Protein: 6.1 g/dL (ref 6.0–8.3)

## 2014-08-11 LAB — GLUCOSE, CAPILLARY
Glucose-Capillary: 187 mg/dL — ABNORMAL HIGH (ref 70–99)
Glucose-Capillary: 202 mg/dL — ABNORMAL HIGH (ref 70–99)

## 2014-08-11 LAB — CULTURE, BLOOD (SINGLE)

## 2014-08-11 LAB — MAGNESIUM: Magnesium: 2.1 mg/dL (ref 1.5–2.5)

## 2014-08-11 LAB — PHOSPHORUS: Phosphorus: 5.1 mg/dL — ABNORMAL HIGH (ref 2.3–4.6)

## 2014-08-11 LAB — PROTIME-INR
INR: 1.34 (ref 0.00–1.49)
Prothrombin Time: 16.6 seconds — ABNORMAL HIGH (ref 11.6–15.2)

## 2014-08-11 MED ORDER — LINEZOLID 2 MG/ML IV SOLN
600.0000 mg | Freq: Two times a day (BID) | INTRAVENOUS | Status: DC
  Administered 2014-08-11 – 2014-08-20 (×18): 600 mg via INTRAVENOUS
  Filled 2014-08-11 (×20): qty 300

## 2014-08-11 MED ORDER — SERTRALINE HCL 50 MG PO TABS: 150.0000 mg | ORAL_TABLET | Freq: Every day | ORAL | Status: DC

## 2014-08-11 MED ORDER — ACETAMINOPHEN 325 MG PO TABS
650.0000 mg | ORAL_TABLET | Freq: Four times a day (QID) | ORAL | Status: DC | PRN
  Administered 2014-08-12: 650 mg via ORAL
  Filled 2014-08-11: qty 2

## 2014-08-11 MED ORDER — NEPRO/CARBSTEADY PO LIQD
237.0000 mL | Freq: Three times a day (TID) | ORAL | Status: DC
  Filled 2014-08-11 (×9): qty 237

## 2014-08-11 MED ORDER — SODIUM CHLORIDE 0.9 % IJ SOLN
3.0000 mL | Freq: Two times a day (BID) | INTRAMUSCULAR | Status: DC
  Administered 2014-08-11 – 2014-08-19 (×9): 3 mL via INTRAVENOUS

## 2014-08-11 MED ORDER — ARIPIPRAZOLE 5 MG PO TABS
5.0000 mg | ORAL_TABLET | Freq: Every day | ORAL | Status: DC
  Administered 2014-08-11 – 2014-08-20 (×10): 5 mg via ORAL
  Filled 2014-08-11 (×11): qty 1

## 2014-08-11 MED ORDER — SODIUM CHLORIDE 0.9 % IV SOLN
1000.0000 mg | INTRAVENOUS | Status: DC
  Administered 2014-08-13: 1000 mg via INTRAVENOUS
  Filled 2014-08-11 (×3): qty 20

## 2014-08-11 MED ORDER — ALBUTEROL SULFATE (2.5 MG/3ML) 0.083% IN NEBU: 2.5000 mg | INHALATION_SOLUTION | Freq: Four times a day (QID) | RESPIRATORY_TRACT | Status: DC | PRN

## 2014-08-11 MED ORDER — INSULIN ASPART 100 UNIT/ML ~~LOC~~ SOLN
0.0000 [IU] | Freq: Every day | SUBCUTANEOUS | Status: DC
  Administered 2014-08-12: 2 [IU] via SUBCUTANEOUS

## 2014-08-11 MED ORDER — INSULIN ASPART 100 UNIT/ML ~~LOC~~ SOLN
0.0000 [IU] | Freq: Three times a day (TID) | SUBCUTANEOUS | Status: DC
  Administered 2014-08-12 (×3): 3 [IU] via SUBCUTANEOUS
  Administered 2014-08-13 (×2): 2 [IU] via SUBCUTANEOUS
  Administered 2014-08-14: 1 [IU] via SUBCUTANEOUS
  Administered 2014-08-15 (×2): 2 [IU] via SUBCUTANEOUS
  Administered 2014-08-15: 1 [IU] via SUBCUTANEOUS
  Administered 2014-08-16: 2 [IU] via SUBCUTANEOUS
  Administered 2014-08-16 – 2014-08-18 (×3): 1 [IU] via SUBCUTANEOUS
  Administered 2014-08-20: 2 [IU] via SUBCUTANEOUS
  Administered 2014-08-20: 08:00:00 via SUBCUTANEOUS

## 2014-08-11 MED ORDER — PRO-STAT SUGAR FREE PO LIQD
30.0000 mL | Freq: Two times a day (BID) | ORAL | Status: DC
  Administered 2014-08-16: 30 mL via ORAL
  Filled 2014-08-11 (×19): qty 30

## 2014-08-11 MED ORDER — ONDANSETRON HCL 4 MG/2ML IJ SOLN: 4.0000 mg | Freq: Four times a day (QID) | INTRAMUSCULAR | Status: DC | PRN

## 2014-08-11 MED ORDER — OXYCODONE HCL 5 MG PO TABS
10.0000 mg | ORAL_TABLET | Freq: Two times a day (BID) | ORAL | Status: DC | PRN
  Administered 2014-08-11 – 2014-08-17 (×7): 10 mg via ORAL
  Filled 2014-08-11 (×7): qty 2

## 2014-08-11 MED ORDER — MIRTAZAPINE 30 MG PO TABS: 30.0000 mg | ORAL_TABLET | Freq: Every day | ORAL | Status: DC

## 2014-08-11 NOTE — Progress Notes (Signed)
Pt arrived to floor by [facility] via stretcher; awake, alert, and conversant. Spouse accompanying. Floor Management paged and made aware. Awaiting admitting physician.

## 2014-08-11 NOTE — Consult Note (Signed)
CONSULTATION NOTE  Reason for Consult: Endocarditis  Requesting Physician: Dr. Benjamine Mola  Cardiologist: Dr. Kirke Corin  HPI: This is a 43 y.o. male with a past medical history significant for CKD3, diabetes with renal complications, venous stasis ulcers, status post left BKA, recurrent cellulitis, morbid obesity, hypertension, and history of MRSA sepsis in August 2015.  Echo in August showed EF of 55-60% with mild concentric LVH, model of moderately dilated LA and RA, mild aortic sclerosis and no vegetation. Over the past several weeks has been experiencing progressive malaise, subjective fevers, chills anorexia, productive cough dyspnea and nausea. On hemodialysis he was found to be more anemic and had it and elevated white count with worsening renal function appeared troponin was noted to be elevated at 0.89, and 1.10, and 1.30. He was admitted to [facility]. Blood cultures were drawn and quickly indicated gram-positive cocci in clusters. He was placed on daptomycin.  Cardiology was consulted and recommended a TEE. This indicated a large vegetation on the aortic valve likely a multi-leaflet vegetation. It was estimated at 1 cm in size. It extended to the aortic root with systole and back into the LVOT in diastole. There was significant aortic valve regurgitation which was estimated to be moderate to severe. There was an aneurysmal atrial septum and a suspected small PFO. Angel Mays was transferred for evaluation of possible cardiac surgery.  PMHx:  Past Medical History  Diagnosis Date  . Normocytic anemia   . Diabetes mellitus with complication   . GERD (gastroesophageal reflux disease)   . Hypertension   . Venous stasis ulcers     a. a. 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 RLE with recurrent cellulitis.  . Morbid obesity   . CKD (chronic kidney disease), stage III   . Gout   . Sepsis     a. 06/2014 MRSA Sepsis->4 wks of Vanc;  b. 06/2014 Echo: EF 55-60%, mild conc LVH, mod dil LA/RA, mild Ao  sclerosis w/o stenosis, no veg/mass.  . Peripheral neuropathy   . Depression   . Cataract   . Seasonal allergies   . Abdominal abscess     a. s/p prior drainage.  Marland Kitchen DVT (deep venous thrombosis)     a. On Eliquis, s/p IVC filter.   Past Surgical History  Procedure Laterality Date  . Below knee leg amputation Left     2013  . Right rotator cuff surgery x 2    . Right hand surgery      FAMHx: Family History  Problem Relation Age of Onset  . Diabetes Mother   . Hyperlipidemia Mother   . Hypertension Mother     SOCHx:  reports that he has never smoked. He has never used smokeless tobacco. He reports that he does not drink alcohol or use illicit drugs.  ALLERGIES: Allergies  Allergen Reactions  . Omnipaque [Iohexol]     ROS: A comprehensive review of systems was negative except for: Constitutional: positive for chills, fatigue, fevers and malaise Respiratory: positive for cough and dyspnea on exertion Cardiovascular: positive for dyspnea Hematologic/lymphatic: positive for anemia  HOME MEDICATIONS: Prescriptions prior to admission  Medication Sig Dispense Refill  . albuterol (ACCUNEB) 0.63 MG/3ML nebulizer solution Take 1 ampule by nebulization every 6 (six) hours as needed for wheezing.      Marland Kitchen albuterol (PROVENTIL) (2.5 MG/3ML) 0.083% nebulizer solution Take 3 mLs (2.5 mg total) by nebulization every 6 (six) hours as needed for wheezing or shortness of breath.  75 mL  12  . allopurinol (ZYLOPRIM) 100 MG tablet Take 100 mg by mouth daily.      . Amino Acids-Protein Hydrolys (FEEDING SUPPLEMENT, PRO-STAT SUGAR FREE 64,) LIQD Take 30 mLs by mouth 2 (two) times daily.  900 mL  0  . amLODipine (NORVASC) 10 MG tablet Take 10 mg by mouth daily.      Marland Kitchen apixaban (ELIQUIS) 5 MG TABS tablet Take 1 tablet (5 mg total) by mouth 2 (two) times daily.  60 tablet  0  . B Complex-C-Folic Acid (NEPHRO-VITE PO) Take by mouth daily.      . beta carotene ([employer] BETA CAROTENE) 60454 UNIT capsule  Take 25,000 Units by mouth daily.      . carvedilol (COREG) 25 MG tablet Take 25 mg by mouth 2 (two) times daily with a meal.      . Cholecalciferol (VITAMIN D-3) 5000 UNITS TABS Take 1 tablet by mouth. Monday-Friday      . clindamycin (CLEOCIN) 300 MG capsule Take 1 capsule (300 mg total) by mouth 3 (three) times daily.  24 capsule  0  . cloNIDine (CATAPRES) 0.2 MG tablet Take 0.2 mg by mouth 2 (two) times daily.      . collagenase (SANTYL) ointment Apply topically daily.  15 g  0  . diphenhydrAMINE (SOMINEX) 25 MG tablet Take 25 mg by mouth at bedtime as needed for sleep.      . ferrous gluconate (FERGON) 324 MG tablet Take 324 mg by mouth 2 (two) times daily with a meal.      . fexofenadine (ALLEGRA) 180 MG tablet Take 180 mg by mouth daily.      . fluticasone (FLONASE) 50 MCG/ACT nasal spray Place 2 sprays into both nostrils daily.      Marland Kitchen gabapentin (NEURONTIN) 300 MG capsule Take 300 mg by mouth 3 (three) times daily.      . heparin 1000 unit/mL SOLN injection 4.1 mLs (4,100 Units total) by Dialysis route as needed (in dialysis).  10 mL  0  . hydrALAZINE (APRESOLINE) 100 MG tablet Take 100 mg by mouth 3 (three) times daily.      . insulin aspart (NOVOLOG) 100 UNIT/ML injection Inject 0-9 Units into the skin 3 (three) times daily with meals.  10 mL  11  . lidocaine, PF, (XYLOCAINE) 1 % SOLN injection Inject 5 mLs into the skin as needed (topical anesthesia for hemodialysis ifGEBAUERS is ineffective.).  30 ampule  0  . lidocaine-prilocaine (EMLA) cream Apply 1 application topically as needed (topical anesthesia for hemodialysis if Gebauers and Lidocaine injection are ineffective.).  30 g  0  . metoCLOPramide (REGLAN) 5 MG tablet Take 1 tablet (5 mg total) by mouth 3 (three) times daily before meals.  90 tablet  0  . Nutritional Supplements (FEEDING SUPPLEMENT, NEPRO CARB STEADY,) LIQD Take 237 mLs by mouth 3 (three) times daily between meals.  90 Can  0  . ondansetron (ZOFRAN) 4 MG tablet Take  4 mg by mouth every 8 (eight) hours as needed for nausea or vomiting.      Marland Kitchen oxyCODONE 10 MG TABS Take 1 tablet (10 mg total) by mouth every 6 (six) hours as needed for severe pain.  30 tablet  0  . pantoprazole (PROTONIX) 40 MG tablet Take 1 tablet (40 mg total) by mouth daily.  30 tablet  0  . pentafluoroprop-tetrafluoroeth (GEBAUERS) AERO Apply 1 application topically as needed for mild pain (topical anesthesia for hemodialysis).  103.5 mL  0  .  pentoxifylline (TRENTAL) 400 MG CR tablet Take 400 mg by mouth 3 (three) times daily with meals.      . polyethylene glycol (MIRALAX / GLYCOLAX) packet Take 17 g by mouth daily.      . prednisoLONE acetate (PRED FORTE) 1 % ophthalmic suspension Place 1 drop into the left eye 4 (four) times daily.      . promethazine (PHENERGAN) 25 MG suppository Place 25 mg rectally every 8 (eight) hours as needed for nausea or vomiting.      . senna (SENOKOT) 8.6 MG tablet Take 2 tablets by mouth daily.      . sertraline (ZOLOFT) 25 MG tablet Take 75 mg by mouth daily.      . simethicone (MYLICON) 80 MG chewable tablet Chew 80 mg by mouth every 6 (six) hours as needed for flatulence.        HOSPITAL MEDICATIONS: Scheduled: . ARIPiprazole  5 mg Oral Daily  . feeding supplement (NEPRO CARB STEADY)  237 mL Oral TID BM  . feeding supplement (PRO-STAT SUGAR FREE 64)  30 mL Oral BID  . insulin aspart  0-5 Units Subcutaneous QHS  . [START ON 08/12/2014] insulin aspart  0-9 Units Subcutaneous TID WC  . linezolid  600 mg Intravenous Q12H  . sodium chloride  3 mL Intravenous Q12H    VITALS: Blood pressure 104/41, pulse 91, temperature 98.3 F (36.8 C), temperature source Oral, resp. rate 20, height  (2.057 m), weight 376 lb (170.552 kg), SpO2 96.00%.  PHYSICAL EXAM: General appearance: fatigued, moderately obese and slowed mentation Neck: no carotid bruit and no JVD Lungs: diminished breath sounds bilaterally Heart: regular rate and rhythm and diastolic murmur:  holodiastolic 3/6, blowing at 2nd left intercostal space Abdomen: soft, non-tender; bowel sounds normal; no masses,  no organomegaly and obese Extremities: s/p L BKA Pulses: reduced pulse Skin: Skin color, texture, turgor normal. No rashes or lesions Neurologic: Mental status: Awake, fatigued, slow speech Psych: Flat affect  LABS: Results for orders placed during the hospital encounter of 08/11/14 (from the past 48 hour(s))  GLUCOSE, CAPILLARY     Status: Abnormal   Collection Time    08/11/14  5:07 PM      Result Value Ref Range   Glucose-Capillary 187 (*) 70 - 99 mg/dL   Comment 1 Documented in Chart     Comment 2 Notify RN      IMAGING: No results found.  HOSPITAL DIAGNOSES: Active Problems:   CKD (chronic kidney disease)   Diabetes mellitus   Bacterial endocarditis   Endocarditis   IMPRESSION: 1. MRSA Aortic Valve endocarditis 2. Severe aortic insufficiency 3. Elevated troponin 4. CKD 3-4, recently on HD (not suspected to be ESRD)   RECOMMENDATION: 1. 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 At this point, he is at very high risk of mortality without surgery, however, he also seems to be a poor surgical candidate. There is recent troponin elevation, which could represent myocarditis, ACS or even abscess. Given the fact he is not thought to be dialysis dependent, I would be hesitant for him to undergo cardiac catheterization due to the risk of contrast nephropathy - but understand the role this may have prior to heart surgery if he were a candidate. I think he needs to understand that catheterization, antibiotics or surgery to try to save his life will  put him at a very likely risk of going on dialysis if he survives and will have to decide if it is worth the risk.  We will need to  have a discussion with cardiac surgery to see if surgery is even feasible. Thanks for consulting [place].  Time Spent Directly with Patient: 60 minutes  Chrystie Nose, MD, [facility] Attending Cardiologist [facility]  Evert Wenrich C 08/11/2014, 6:41 PM

## 2014-08-11 NOTE — Progress Notes (Signed)
Long discussion with the patient and husband at the beside about the seriousness of his condition. He is not considered a surgical candidate and is likely to die soon from his endocarditis. I asked him to consider a DNR status and he understandably needs time to think and discuss it further with his wife. Will notify the hospitalist coverage on call that possible DNR status is pending.  Chrystie Nose, MD, [facility] Attending Cardiologist [facility]

## 2014-08-11 NOTE — Progress Notes (Signed)
ANTIBIOTIC CONSULT NOTE - INITIAL  Pharmacy Consult for daptomycin/zyvox Indication: endocarditis and MRSA bacteremia  Allergies  Allergen Reactions  . Omnipaque [Iohexol]     Patient Measurements: Height:  (205.7 cm) Weight: 376 lb (170.552 kg) IBW/kg (Calculated) : 98.3   Vital Signs: Temp: 98.3 F (36.8 C) (09/23 1700) Temp src: Oral (09/23 1700) BP: 104/41 mmHg (09/23 1700) Pulse Rate: 91 (09/23 1700) Intake/Output from previous day:   Intake/Output from this shift:    Labs: No results found for this basename: WBC, HGB, PLT, LABCREA, CREATININE,  in the last 72 hours Estimated Creatinine Clearance: 66.1 ml/min (by C-G formula based on Cr of 2.54). No results found for this basename: VANCOTROUGH, VANCOPEAK, VANCORANDOM, GENTTROUGH, GENTPEAK, GENTRANDOM, TOBRATROUGH, TOBRAPEAK, TOBRARND, AMIKACINPEAK, AMIKACINTROU, AMIKACIN,  in the last 72 hours   Microbiology: No results found for this or any previous visit (from the past 720 hour(s)).  Medical History: Past Medical History  Diagnosis Date  . Normocytic anemia   . Diabetes mellitus with complication   . GERD (gastroesophageal reflux disease)   . Hypertension   . Venous stasis ulcers     a. a. 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 RLE with recurrent cellulitis.  . Morbid obesity   . CKD (chronic kidney disease), stage III   . Gout   . Sepsis     a. 06/2014 MRSA Sepsis->4 wks of Vanc;  b. 06/2014 Echo: EF 55-60%, mild conc LVH, mod dil LA/RA, mild Ao sclerosis w/o stenosis, no veg/mass.  . Peripheral neuropathy   . Depression   . Cataract   . Seasonal allergies   . Abdominal abscess     a. s/p prior drainage.  Marland Kitchen DVT (deep venous thrombosis)     a. On Eliquis, s/p IVC filter.    Assessment: 44 yom with very complicated past medical history including staph bacteremia in august a TTE at that time showed no evidence of endocarditis. He was readmitted to [facility] for MRSA bacteremia and now recent TEE showed aortic valve  endocarditis with mobile vegetation.   I have called [facility] pharmacy and patient has been receiving daptomycin  IV q48 hours (last dose was 9/22@2300 ) and zyvox  IV q12 hours. No recent CK was seen and scr is elevated at 6.2. Will continue dosing here at [facility].   It does not appear at this time that patient is a good surgical candidate.  Goal of Therapy:  Eradication of infection  Plan:  Follow up culture results Daptomycin  IV q48h (next dose 9/24 ) ( /kg/dose based on ABW) Zyvox  IV q 12 hours  Sheppard Coil PharmD., BCPS Clinical Pharmacist Pager [phone] 08/11/2014 8:37 PM

## 2014-08-11 NOTE — Consult Note (Signed)
Reason for Consult:Aortic valve endocarditis Referring Physician: Marlin Canary, DO  Angel Mays is an 43 y.o. male.  HPI:  43 year old male with multiple severe medical problems transferred from [place] with aortic valve endocarditis.  Angel Mays is a 43 year old morbidly obese male with a complicated PMH including CKD recently on HD, diabetes, HTN, previous left BKA, chronically infected right leg ulcers, DVT and recent pneumonia.  History obtained from chart and patient's wife. He is unable to provide history. According to his wife he has been ill for most of the past 2 years. He has been more acutely ill for the past 2 months. His wife says about 2 months ago he developed pneumonia and has never recovered in the interim. He has been hospitalized with an infected right leg and been on IV antibiotics for that. He had a staph aureus bacteremia in August. His leg was thought to be the source. A TTE showed no evidence of endocarditis.  He was readmitted with + blood cultures and a WBC of 26K. He was treated wit daptomycin and is now on linezolid. He has been going downhill over the past 3 weeks. Per his wife he is only taking Glucerna but has not eaten solid food in that time. He has had altered mental status since last Thursday and has been very lethargic.   A TEE today showed aortic valve endocarditis with a mobile vegetation and moderately severe AI.    Past Medical History  Diagnosis Date  . Normocytic anemia   . Diabetes mellitus with complication   . GERD (gastroesophageal reflux disease)   . Hypertension   . Venous stasis ulcers     a. a. 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 RLE with recurrent cellulitis.  . Morbid obesity   . CKD (chronic kidney disease), stage III   . Gout   . Sepsis     a. 06/2014 MRSA Sepsis->4 wks of Vanc;  b. 06/2014 Echo: EF 55-60%, mild conc LVH, mod dil LA/RA, mild Ao sclerosis w/o stenosis, no veg/mass.  . Peripheral neuropathy   . Depression   . Cataract   . Seasonal  allergies   . Abdominal abscess     a. s/p prior drainage.  Marland Kitchen DVT (deep venous thrombosis)     a. On Eliquis, s/p IVC filter.    Past Surgical History  Procedure Laterality Date  . Below knee leg amputation Left     2013  . Right rotator cuff surgery x 2    . Right hand surgery      Family History  Problem Relation Age of Onset  . Diabetes Mother   . Hyperlipidemia Mother   . Hypertension Mother     Social History:  reports that he has never smoked. He has never used smokeless tobacco. He reports that he does not drink alcohol or use illicit drugs.  Allergies:  Allergies  Allergen Reactions  . Omnipaque [Iohexol]     Medications:  Scheduled: . ARIPiprazole  5 mg Oral Daily  . feeding supplement (NEPRO CARB STEADY)  237 mL Oral TID BM  . feeding supplement (PRO-STAT SUGAR FREE 64)  30 mL Oral BID  . insulin aspart  0-5 Units Subcutaneous QHS  . [START ON 08/12/2014] insulin aspart  0-9 Units Subcutaneous TID WC  . linezolid  600 mg Intravenous Q12H  . sodium chloride  3 mL Intravenous Q12H    Results for orders placed during the hospital encounter of 08/11/14 (from the past  48 hour(s))  GLUCOSE, CAPILLARY     Status: Abnormal   Collection Time    08/11/14  5:07 PM      Result Value Ref Range   Glucose-Capillary 187 (*) 70 - 99 mg/dL   Comment 1 Documented in Chart     Comment 2 Notify RN      No results found.  Review of Systems  Constitutional: Positive for fever and malaise/fatigue.  Respiratory: Positive for cough and shortness of breath.   Genitourinary:       Dialysis  Musculoskeletal:       Left BKA, right leg infected  Neurological: Positive for focal weakness (generally weak but right grip stronger than left) and weakness.       Altered mental status   Blood pressure 104/41, pulse 91, temperature 98.3 F (36.8 C), temperature source Oral, resp. rate 20, height  (2.057 m), weight 376 lb (170.552 kg), SpO2 96.00%. Physical Exam  Vitals  reviewed. Constitutional: No distress.  Morbidly obese, ill-appearing  HENT:  Head: Normocephalic and atraumatic.  Eyes: EOM are normal. Pupils are equal, round, and reactive to light.  Neck: Neck supple. No thyromegaly present.  Cardiovascular: Normal rate and regular rhythm.   Murmur (+ systolic and diastolic murmur) heard. Line in left femoral vein  Respiratory:  Diminished BS at bases  GI: Soft.  Musculoskeletal: He exhibits edema.  Right leg with chronic venous stasis changes, multiple ulcers with foul smelling exudate. Is able to move right foot  Neurological:  Lethargic, generally weak, left grip 3/5, right 4/5    Assessment/Plan: 43 yo man with multiple severe medical problems who now has staph aureus aortic valve endocarditis. He has multiple issues acute and chronic which complicate the situation.  He is morbidly obese and not ambulatory even prior to his acute illness. He has a grossly infected right leg and has already had a left BKA. He is lethargic and has a generalized myopathy with some possible focal weakness as well.  His chances of meaningful survival after an aortic root replacement in this setting- right leg amputation, onset of HD, sepsis with encephalopathy and myopathy are near zero. I really do not think he is an operative candidate but will await input from other consultants and discuss with them and the patient/ wife.  I did discuss the dire nature and poor prognosis with his wife this evening. She understands the issues.  Angel Mays C 08/11/2014, 7:29 PM

## 2014-08-11 NOTE — H&P (Signed)
[facility] History and Physical  Angel Mays VWU:[id number] DOB: 1971-04-18 DOA: 08/11/2014  Referring physician: Randell Loop PCP: Abner Greenspan, MD   Chief Complaint: transfer  HPI: Angel Mays is a 43 y.o. male  Who was transferred from [facility]- accepted by Dr. Robb Matar.   This is a 43 year old male with a very complicated past medical history including diabetes, hypertension, morbid obesity, chronic kidney disease, and left below-knee amputation. Patient has been in and out of hospitals since 2013. Not ambulatory at baseline.  Wife at bedside is unable to provide exact dates of hospitalizations and/or treatments. Per the record that [facility] sent with patient- which DID NOT include a discharge summary.  In August, he has a staph aureus bacteremia that they believe  is associated with a right lower extremity infection. He was treated there with 4 weeks of IV vancomycin. A transthoracic echo done at that time was negative for endocarditis. A transesophageal echocardiogram was never done. Patient supposedly cleared his blood cultures.   Patient presents back to the hospital with positive blood cultures in September. He was also found to have acute on chronic renal failure with a white blood cell count 26,000. Patient was also found to be anemic at 6.1. At [facility] they thought likely source was either a recurrent infection of his right lateral calf wound.   Patient also has an IVC filter placed at some point for DVT.   Patient was seen by multiple consultations at [facility]. 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 Patient was treated with daptomycin as well lizazolid. On 9/23. 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 Was estimated at 1 cm in size with 2 or more seen at that time. It was extending into the 8 aortic root with  systole back and to the LVOT in diastole. There was significant aortic valve regurgitation that was estimated at moderate to severe.  Cultures 9/16 grew: MRSA Cr: 6.24 9/22 WBC: 22 9/22 Patient was transferred here for evaluation by CV surgery.     Review of Systems:  Unable to do ROS as patient minimally verbal   Past Medical History  Diagnosis Date  . Normocytic anemia   . Diabetes mellitus with complication   . GERD (gastroesophageal reflux disease)   . Hypertension   . Venous stasis ulcers     a. a. 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 RLE with recurrent cellulitis.  . Morbid obesity   . CKD (chronic kidney disease), stage III   . Gout   . Sepsis     a. 06/2014 MRSA Sepsis->4 wks of Vanc;  b. 06/2014 Echo: EF 55-60%, mild conc LVH, mod dil LA/RA, mild Ao sclerosis w/o stenosis, no veg/mass.  . Peripheral neuropathy   . Depression   . Cataract   . Seasonal allergies   . Abdominal abscess     a. s/p prior drainage.  Marland Kitchen DVT (deep venous thrombosis)     a. On Eliquis, s/p IVC filter.   Past Surgical History  Procedure Laterality Date  . Below knee leg amputation Left     2013  . Right rotator cuff surgery x 2    . Right hand surgery     Social History:  reports that he has never smoked. He has never used smokeless tobacco. He reports that he does not drink alcohol or use illicit drugs.  Allergies  Allergen Reactions  . Omnipaque [Iohexol]     Family History  Problem Relation Age of Onset  . Diabetes Mother   . Hyperlipidemia Mother   . Hypertension Mother      Prior to Admission medications   Medication Sig Start Date End Date Taking? Authorizing Provider  albuterol (ACCUNEB) 0.63 MG/3ML nebulizer solution Take 1 ampule by nebulization every 6 (six) hours as needed for wheezing.    Historical Provider, MD  albuterol (PROVENTIL) (2.5 MG/3ML) 0.083% nebulizer solution Take 3 mLs (2.5 mg total) by nebulization every 6 (six) hours as needed for wheezing or shortness of breath.  01/28/14   Drema Dallas, MD  allopurinol (ZYLOPRIM) 100 MG tablet Take 100 mg by mouth daily.    Historical Provider, MD  Amino Acids-Protein Hydrolys (FEEDING SUPPLEMENT, PRO-STAT SUGAR FREE 64,) LIQD Take 30 mLs by mouth 2 (two) times daily. 01/28/14   Drema Dallas, MD  amLODipine (NORVASC) 10 MG tablet Take 10 mg by mouth daily.    Historical Provider, MD  apixaban (ELIQUIS) 5 MG TABS tablet Take 1 tablet (5 mg total) by mouth 2 (two) times daily. 01/28/14   Drema Dallas, MD  B Complex-C-Folic Acid (NEPHRO-VITE PO) Take by mouth daily.    Historical Provider, MD  beta carotene (CVS BETA CAROTENE) 96045 UNIT capsule Take 25,000 Units by mouth daily.    Historical Provider, MD  carvedilol (COREG) 25 MG tablet Take 25 mg by mouth 2 (two) times daily with a meal.    Historical Provider, MD  Cholecalciferol (VITAMIN D-3) 5000 UNITS TABS Take 1 tablet by mouth. Monday-Friday    Historical Provider, MD  clindamycin (CLEOCIN) 300 MG capsule Take 1 capsule (300 mg total) by mouth 3 (three) times daily. 01/28/14   Drema Dallas, MD  cloNIDine (CATAPRES) 0.2 MG tablet Take 0.2 mg by mouth 2 (two) times daily.    Historical Provider, MD  collagenase (SANTYL) ointment Apply topically daily. 01/28/14   Drema Dallas, MD  diphenhydrAMINE (SOMINEX) 25 MG tablet Take 25 mg by mouth at bedtime as needed for sleep.    Historical Provider, MD  ferrous gluconate (FERGON) 324 MG tablet Take 324 mg by mouth 2 (two) times daily with a meal.    Historical Provider, MD  fexofenadine (ALLEGRA) 180 MG tablet Take 180 mg by mouth daily.    Historical Provider, MD  fluticasone (FLONASE) 50 MCG/ACT nasal spray Place 2 sprays into both nostrils daily.    Historical Provider, MD  gabapentin (NEURONTIN) 300 MG capsule Take 300 mg by mouth 3 (three) times daily.    Historical Provider, MD  heparin 1000 unit/mL SOLN injection 4.1 mLs (4,100 Units total) by Dialysis route as needed (in dialysis). 01/28/14   Drema Dallas, MD    hydrALAZINE (APRESOLINE) 100 MG tablet Take 100 mg by mouth 3 (three) times daily.    Historical Provider, MD  insulin aspart (NOVOLOG) 100 UNIT/ML injection Inject 0-9 Units into the skin 3 (three) times daily with meals. 01/28/14   Drema Dallas, MD  lidocaine, PF, (XYLOCAINE) 1 % SOLN injection Inject 5 mLs into the skin as needed (topical anesthesia for hemodialysis ifGEBAUERS is ineffective.). 01/28/14   Drema Dallas, MD  lidocaine-prilocaine (EMLA) cream Apply 1 application topically as needed (topical anesthesia for hemodialysis if Gebauers and Lidocaine injection are ineffective.). 01/28/14   Drema Dallas, MD  metoCLOPramide (REGLAN) 5 MG tablet Take 1 tablet (5 mg total) by mouth 3 (three)  times daily before meals. 01/28/14   Drema Dallas, MD  Nutritional Supplements (FEEDING SUPPLEMENT, NEPRO CARB STEADY,) LIQD Take 237 mLs by mouth 3 (three) times daily between meals. 01/28/14   Drema Dallas, MD  ondansetron (ZOFRAN) 4 MG tablet Take 4 mg by mouth every 8 (eight) hours as needed for nausea or vomiting.    Historical Provider, MD  oxyCODONE 10 MG TABS Take 1 tablet (10 mg total) by mouth every 6 (six) hours as needed for severe pain. 01/28/14   Drema Dallas, MD  pantoprazole (PROTONIX) 40 MG tablet Take 1 tablet (40 mg total) by mouth daily. 01/28/14   Drema Dallas, MD  pentafluoroprop-tetrafluoroeth Peggye Pitt) AERO Apply 1 application topically as needed for mild pain (topical anesthesia for hemodialysis). 01/28/14   Drema Dallas, MD  pentoxifylline (TRENTAL) 400 MG CR tablet Take 400 mg by mouth 3 (three) times daily with meals.    Historical Provider, MD  polyethylene glycol (MIRALAX / GLYCOLAX) packet Take 17 g by mouth daily.    Historical Provider, MD  prednisoLONE acetate (PRED FORTE) 1 % ophthalmic suspension Place 1 drop into the left eye 4 (four) times daily.    Historical Provider, MD  promethazine (PHENERGAN) 25 MG suppository Place 25 mg rectally every 8 (eight) hours as  needed for nausea or vomiting.    Historical Provider, MD  senna (SENOKOT) 8.6 MG tablet Take 2 tablets by mouth daily.    Historical Provider, MD  sertraline (ZOLOFT) 25 MG tablet Take 75 mg by mouth daily.    Historical Provider, MD  simethicone (MYLICON) 80 MG chewable tablet Chew 80 mg by mouth every 6 (six) hours as needed for flatulence.    Historical Provider, MD   Physical Exam: Filed Vitals:   08/11/14 1700  BP: 104/41  Pulse: 91  Temp: 98.3 F (36.8 C)  TempSrc: Oral  Resp: 20  Height:  (2.057 m)  Weight: 170.552 kg (376 lb)  SpO2: 96%    Wt Readings from Last 3 Encounters:  08/11/14 170.552 kg (376 lb)  01/26/14 188 kg (414 lb 7.4 oz)  01/11/14 222.263 kg (490 lb)    General:  chronically ill appearing Eyes: PERRL, normal lids, irises & conjunctiva ENT: grossly normal hearing, lips & tongue Neck: no LAD, masses or thyromegaly Cardiovascular: RRR, +murmur Respiratory: CTA bilaterally, no wheezing. Normal respiratory effort. Abdomen: soft, ntnd, obese Skin: right leg with venous stasis changes and skin sloghing Musculoskeletal: weak, left leg BKA Neurologic: not following directions          Labs on Admission:  Basic Metabolic Panel: No results found for this basename: NA, K, CL, CO2, GLUCOSE, BUN, CREATININE, CALCIUM, MG, PHOS,  in the last 168 hours Liver Function Tests: No results found for this basename: AST, ALT, ALKPHOS, BILITOT, PROT, ALBUMIN,  in the last 168 hours No results found for this basename: LIPASE, AMYLASE,  in the last 168 hours No results found for this basename: AMMONIA,  in the last 168 hours CBC: No results found for this basename: WBC, NEUTROABS, HGB, HCT, MCV, PLT,  in the last 168 hours Cardiac Enzymes: No results found for this basename: CKTOTAL, CKMB, CKMBINDEX, TROPONINI,  in the last 168 hours  BNP (last 3 results) No results found for this basename: PROBNP,  in the last 8760 hours CBG:  Recent Labs Lab 08/11/14 1707    GLUCAP 187*    Radiological Exams on Admission: No results found.    Assessment/Plan Active  Problems:   CKD (chronic kidney disease)   Diabetes mellitus   Bacterial endocarditis   Endocarditis (MRSA in blood cultures)- aortic- ID consult, cards consult, blood cultures x 2 here, continue abx that patient was on at [facility] until input by ID -spoke with CTS that will see after cardiology sees and caths  DM-SSI  CKD- monitor renal function- baseline appears to be elevated -appears that dialysis was started on 9/21 -patient does not currently appear to be volume overloaded -renal consult in AM  Right leg infection- continue abx- may need further imaging and possible surgery once patient stabilized from a cardiac standpoint  Anemia- await labs here - received 5 units of PRBC at   H/o DVT- was on eliquis -held due to anemia and renal issues  morbidly obese  Family upset at bedside that surgery on valve not being done tonight attempted to reassure but wife dismissed me   [facility]- [place] (will see after cardiology sees) ID- left message with Dr Luciana Axe for Dr. Ninetta Lights Cardiology- Dr. Rennis Golden to see Will probably need vascular consult for left leg May need renal consult- await labs here  Code Status: full DVT Prophylaxis: Family Communication: wife at bedside Disposition Plan:   Time spent: 95 min  Marlin Canary [facility] Pager [phone]

## 2014-08-12 ENCOUNTER — Encounter: Payer: Self-pay | Admitting: Cardiovascular Disease

## 2014-08-12 ENCOUNTER — Inpatient Hospital Stay (HOSPITAL_COMMUNITY): Payer: Medicare Other

## 2014-08-12 DIAGNOSIS — N186 End stage renal disease: Secondary | ICD-10-CM

## 2014-08-12 DIAGNOSIS — I351 Nonrheumatic aortic (valve) insufficiency: Secondary | ICD-10-CM | POA: Diagnosis present

## 2014-08-12 DIAGNOSIS — L97909 Non-pressure chronic ulcer of unspecified part of unspecified lower leg with unspecified severity: Secondary | ICD-10-CM

## 2014-08-12 DIAGNOSIS — E1129 Type 2 diabetes mellitus with other diabetic kidney complication: Secondary | ICD-10-CM

## 2014-08-12 DIAGNOSIS — I38 Endocarditis, valve unspecified: Secondary | ICD-10-CM

## 2014-08-12 DIAGNOSIS — I359 Nonrheumatic aortic valve disorder, unspecified: Secondary | ICD-10-CM

## 2014-08-12 DIAGNOSIS — E1169 Type 2 diabetes mellitus with other specified complication: Secondary | ICD-10-CM

## 2014-08-12 DIAGNOSIS — G9340 Encephalopathy, unspecified: Secondary | ICD-10-CM

## 2014-08-12 DIAGNOSIS — R7881 Bacteremia: Secondary | ICD-10-CM | POA: Diagnosis present

## 2014-08-12 DIAGNOSIS — A4902 Methicillin resistant Staphylococcus aureus infection, unspecified site: Secondary | ICD-10-CM

## 2014-08-12 LAB — CBC WITH DIFFERENTIAL/PLATELET
BASOS ABS: 0 10*3/uL (ref 0.0–0.1)
Basophils Relative: 0 % (ref 0–1)
Eosinophils Absolute: 0.1 10*3/uL (ref 0.0–0.7)
Eosinophils Relative: 0 % (ref 0–5)
HEMATOCRIT: 21.6 % — AB (ref 39.0–52.0)
Hemoglobin: 7.1 g/dL — ABNORMAL LOW (ref 13.0–17.0)
LYMPHS ABS: 1.3 10*3/uL (ref 0.7–4.0)
LYMPHS PCT: 6 % — AB (ref 12–46)
MCH: 27.8 pg (ref 26.0–34.0)
MCHC: 32.9 g/dL (ref 30.0–36.0)
MCV: 84.7 fL (ref 78.0–100.0)
Monocytes Absolute: 1.2 10*3/uL — ABNORMAL HIGH (ref 0.1–1.0)
Monocytes Relative: 5 % (ref 3–12)
Neutro Abs: 20.3 10*3/uL — ABNORMAL HIGH (ref 1.7–7.7)
Neutrophils Relative %: 89 % — ABNORMAL HIGH (ref 43–77)
PLATELETS: 236 10*3/uL (ref 150–400)
RBC: 2.55 MIL/uL — AB (ref 4.22–5.81)
RDW: 17.5 % — ABNORMAL HIGH (ref 11.5–15.5)
WBC: 22.9 10*3/uL — AB (ref 4.0–10.5)

## 2014-08-12 LAB — COMPREHENSIVE METABOLIC PANEL
ALBUMIN: 2.1 g/dL — AB (ref 3.5–5.2)
ALT: 52 U/L (ref 0–53)
AST: 78 U/L — AB (ref 0–37)
Alkaline Phosphatase: 126 U/L — ABNORMAL HIGH (ref 39–117)
Anion gap: 18 — ABNORMAL HIGH (ref 5–15)
BILIRUBIN TOTAL: 1.4 mg/dL — AB (ref 0.3–1.2)
BUN: 83 mg/dL — ABNORMAL HIGH (ref 6–23)
CHLORIDE: 101 meq/L (ref 96–112)
CO2: 23 mEq/L (ref 19–32)
CREATININE: 6.26 mg/dL — AB (ref 0.50–1.35)
Calcium: 8.1 mg/dL — ABNORMAL LOW (ref 8.4–10.5)
GFR calc Af Amer: 11 mL/min — ABNORMAL LOW (ref >90)
GFR calc non Af Amer: 10 mL/min — ABNORMAL LOW (ref >90)
Glucose, Bld: 211 mg/dL — ABNORMAL HIGH (ref 70–99)
POTASSIUM: 5.5 meq/L — AB (ref 3.7–5.3)
SODIUM: 142 meq/L (ref 137–147)
Total Protein: 6.3 g/dL (ref 6.0–8.3)

## 2014-08-12 LAB — URINE MICROSCOPIC-ADD ON

## 2014-08-12 LAB — URINALYSIS, ROUTINE W REFLEX MICROSCOPIC
Glucose, UA: 100 mg/dL — AB
Ketones, ur: 15 mg/dL — AB
NITRITE: POSITIVE — AB
Protein, ur: 30 mg/dL — AB
Specific Gravity, Urine: 1.024 (ref 1.005–1.030)
Urobilinogen, UA: 1 mg/dL (ref 0.0–1.0)
pH: 5 (ref 5.0–8.0)

## 2014-08-12 LAB — HIV ANTIBODY (ROUTINE TESTING W REFLEX): HIV: NONREACTIVE

## 2014-08-12 LAB — GLUCOSE, CAPILLARY
GLUCOSE-CAPILLARY: 213 mg/dL — AB (ref 70–99)
GLUCOSE-CAPILLARY: 235 mg/dL — AB (ref 70–99)
Glucose-Capillary: 196 mg/dL — ABNORMAL HIGH (ref 70–99)
Glucose-Capillary: 204 mg/dL — ABNORMAL HIGH (ref 70–99)
Glucose-Capillary: 208 mg/dL — ABNORMAL HIGH (ref 70–99)

## 2014-08-12 LAB — CK: Total CK: 27 U/L (ref 7–232)

## 2014-08-12 LAB — MAGNESIUM: Magnesium: 2.1 mg/dL (ref 1.5–2.5)

## 2014-08-12 MED ORDER — INSULIN DETEMIR 100 UNIT/ML ~~LOC~~ SOLN
15.0000 [IU] | Freq: Every day | SUBCUTANEOUS | Status: DC
  Administered 2014-08-12 – 2014-08-19 (×8): 15 [IU] via SUBCUTANEOUS
  Filled 2014-08-12 (×9): qty 0.15

## 2014-08-12 MED ORDER — RIFAMPIN 600 MG IV SOLR
300.0000 mg | Freq: Three times a day (TID) | INTRAVENOUS | Status: DC
  Administered 2014-08-12 – 2014-08-20 (×24): 300 mg via INTRAVENOUS
  Filled 2014-08-12 (×30): qty 300

## 2014-08-12 NOTE — Progress Notes (Signed)
[facility] PROGRESS NOTE  Angel Mays ZOX:[id number] DOB: 1971/04/13 DOA: 08/11/2014 PCP: Abner Greenspan, MD  Assessment/Plan: #1 aortic valvular endocarditis/aortic valvular insufficiency Questionable etiology. Infection may be secondary to ulcer on the lower extremity which may have seeded. Patient has been seen by cardiology and cardiothoracic surgery and patient deemed not operative candidate. Continue empiric IV Cubicin and IV Zyvox for now. Patient has been seen by infectious disease and recommended addition of rifampin. CVTS, cardiology following and appreciate input and recommendations. Per cardiology and cardiothoracic surgery patient's has a poor prognosis. We'll also have palliative care status for goals of care.  #2 MRSA bacteremia Continue empiric IV Cubicin and IV Zyvox. Rifampin as an added or ID. ID following and appreciate input and recommendations.  #3 right lower extremity wound/infection Continue current IV antibiotics. Consult with wound care. Will get x-rays of the right lower extremity.  #4 DM2 Will resume home regimen of long-acting insulin. Sliding scale insulin.  #5 chronic kidney disease stage IV/5 Per wife patient has been temporarily on hemodialysis while in the hospital at [facility]. Patient with poor urinary output. Will consult with nephrology for further evaluation and management.  #6 anemia The admission H&P patient is status post 5 units packed red blood cells at [place]. Check an anemia panel. Follow H&H.  #7 history of DVT Patient was on Elliquis, however this was felt secondary to anemia and chronic kidney disease. Patient is status post IVC filter.  #8 prophylaxis PPI for GI prophylaxis. SCDs for DVT prophylaxis.  #9 prognosis Patient with a very poor prognosis. Patient is presenting with aortic valvular endocarditis, MRSA bacteremia, chronic kidney disease stage IV/5, right lower extremity wound infection, type II diabetic and also noted  to be anemic. Patient deemed not a surgical candidate. We'll consult the palliative care for goals of care.  Code Status: Full Family Communication: Updated patient and wife and children at bedside. Disposition Plan: Home versus SNF when medically stable.   Consultants:  ID: Dr. Ninetta Lights 08/12/2014  Nephrology: Dr.Powell 08/12/2014  Cardiothoracic surgery Dr. Dorris Fetch 08/11/2014  Cardiology: Dr. Rennis Golden 08/11/2014  Procedures:  Chest x-ray 08/12/2014  TEE 08/11/2014  Antibiotics: Total days of antibiotics day 8  IV daptomycin 08/12/2014  IV Zyvox 08/11/2014  Oral rifampin 08/12/2014  HPI/Subjective: Patient denies any chest pain. Patient states he is just tired. Patient denies any shortness of breath.  Objective: Filed Vitals:   08/12/14 0450  BP: 125/45  Pulse: 94  Temp: 99.2 F (37.3 C)  Resp: 18   No intake or output data in the 24 hours ending 08/12/14 1128 Filed Weights   08/11/14 1700  Weight: 170.552 kg (376 lb)    Exam:   General:  NAD  Cardiovascular: RRR  Respiratory: CTAB  Abdomen: Soft, nontender, nondistended, positive bowel sounds.  Musculoskeletal: No clubbing cyanosis or edema. Right leg with chronic venous stasis changes. Right lower extremity with thickened crust, ulcer on the medial side of the calf.  Data Reviewed: Basic Metabolic Panel:  Recent Labs Lab 08/11/14 2100  NA 140  K 5.3  CL 101  CO2 23  GLUCOSE 176*  BUN 79*  CREATININE 5.51*  CALCIUM 8.1*  MG 2.1  PHOS 5.1*   Liver Function Tests:  Recent Labs Lab 08/11/14 2100  AST 90*  ALT 44  ALKPHOS 129*  BILITOT 1.3*  PROT 6.1  ALBUMIN 2.0*   No results found for this basename: LIPASE, AMYLASE,  in the last 168 hours No results found for this basename: AMMONIA,  in  the last 168 hours CBC:  Recent Labs Lab 08/11/14 2222 08/12/14 1012  WBC 21.5* 22.9*  NEUTROABS 18.5* 20.3*  HGB 7.1* 7.1*  HCT 20.7* 21.6*  MCV 83.5 84.7  PLT 223 236   Cardiac  Enzymes: No results found for this basename: CKTOTAL, CKMB, CKMBINDEX, TROPONINI,  in the last 168 hours BNP (last 3 results) No results found for this basename: PROBNP,  in the last 8760 hours CBG:  Recent Labs Lab 08/11/14 1707 08/11/14 2157 08/12/14 0841 08/12/14 1014  GLUCAP 187* 202* 196* 213*    No results found for this or any previous visit (from the past 240 hour(s)).   Studies: Dg Chest Port 1 View  08/12/2014   CLINICAL DATA:  Shortness of breath.  EXAM: PORTABLE CHEST - 1 VIEW  COMPARISON:  August 04, 2014.  FINDINGS: Stable cardiomegaly. No pneumothorax is noted. Right lung is clear. Left perihilar and basilar opacity is noted concerning for edema or possibly pneumonia. No significant pleural effusion is noted. Bony thorax is intact.  IMPRESSION: Left perihilar and basilar opacity is concerning for edema or possibly pneumonia. Followup radiographs are recommended until resolution.   Electronically Signed   By: Roque Lias M.D.   On: 08/12/2014 08:36    Scheduled Meds: . ARIPiprazole  5 mg Oral Daily  . DAPTOmycin (CUBICIN)  IV  1,000 mg Intravenous Q48H  . feeding supplement (NEPRO CARB STEADY)  237 mL Oral TID BM  . feeding supplement (PRO-STAT SUGAR FREE 64)  30 mL Oral BID  . insulin aspart  0-5 Units Subcutaneous QHS  . insulin aspart  0-9 Units Subcutaneous TID WC  . linezolid  600 mg Intravenous Q12H  . sodium chloride  3 mL Intravenous Q12H   Continuous Infusions:   Principal Problem:   Endocarditis of aortic valve Active Problems:   Infectious aortic insufficiency   Acute encephalopathy   CKD (chronic kidney disease)   Leg ulcer   Diabetes mellitus   HTN (hypertension)   History of DVT (deep vein thrombosis)   Endocarditis   Bacteremia    Time spent: 35 minutes    [facility] MD [facility] Pager [phone]. If 7PM-7AM, please contact night-coverage at [email or web address], password [facility] 08/12/2014, 11:28 AM  LOS: 1 day

## 2014-08-12 NOTE — Progress Notes (Signed)
Utilization review completed. Aryanah Enslow, RN, BSN. 

## 2014-08-12 NOTE — Progress Notes (Signed)
Pt states he normally produces a small amount of urine when he is drinking/eating.   MD ordered UA; pt refuses in/out cath.  Says he will make urine as soon as he can start drinking.  NPO in Epic; will call MD and ask to advance order if possible.

## 2014-08-12 NOTE — Consult Note (Addendum)
[facility]  Date of Admission:  08/11/2014  Date of Consult:  08/12/2014  Reason for Consult:Endocarditis Referring Physician: Lynda Rainwater   Impression/Recommendation Endocarditis of Ao MRSA bacteremia ESRD RLE infection Diabetic Ulcer Diabetes mellitus 2 Hx of C diff (02-2014)   Would Continue his current anbx Add rifampin Consider imaging of his RLE Consider surgical eval for his RLE WOC eval for his RLE Check CK Check HIV Consider MRI of head for embolic w/u  Comment Not clear if infection from his foot/leg and then seeded valve or if PIC was seeded then seeded valve.  He is not felt to be an operative CV candidate.    Thank you so much for this interesting consult,   Angel Mays (pager) [phone] [email or web address]  Angel Mays is an 43 y.o. male.  HPI: 43 yo M with hx of DM2 since 1997 (pre L BKA 2013), obesity, CKD. He was seen in [facility] hospital August 2015 and had staph bacteremia (MRSA) and RLE extremity infection. He had TTE (-), given 4 weeks of vanco.  He returned to ID clinic 9-15 and had repeat BCx,(9-18, 9-20) again positive for MRSA (S- gent, rif). He was admitted on same day due to leukocytosis and worsening Cr. His PIC was removed on 9-17. His BCx done in the hospital ((9-20) were also + for MRSA. He was started on daptomycin 9-16 as well as linezolid (9-22). He had TEE showing large Ao valve vegetations and significant AVR.  He is transferred to [facility] on 9-23 for CVTS eval.    Past Medical History  Diagnosis Date  . Normocytic anemia   . Diabetes mellitus with complication   . GERD (gastroesophageal reflux disease)   . Hypertension   . Venous stasis ulcers     a. a. 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 RLE with recurrent cellulitis.  . Morbid obesity   . CKD (chronic kidney disease), stage III   . Gout   . Sepsis     a. 06/2014 MRSA Sepsis->4 wks of Vanc;  b. 06/2014 Echo: EF 55-60%, mild conc LVH,  mod dil LA/RA, mild Ao sclerosis w/o stenosis, no veg/mass.  . Peripheral neuropathy   . Depression   . Cataract   . Seasonal allergies   . Abdominal abscess     a. s/p prior drainage.  Marland Kitchen DVT (deep venous thrombosis)     a. On Eliquis, s/p IVC filter.    Past Surgical History  Procedure Laterality Date  . Below knee leg amputation Left     2013  . Right rotator cuff surgery x 2    . Right hand surgery       Allergies  Allergen Reactions  . Omnipaque [Iohexol]     Medications:  Scheduled: . ARIPiprazole  5 mg Oral Daily  . DAPTOmycin (CUBICIN)  IV  1,000 mg Intravenous Q48H  . feeding supplement (NEPRO CARB STEADY)  237 mL Oral TID BM  . feeding supplement (PRO-STAT SUGAR FREE 64)  30 mL Oral BID  . insulin aspart  0-5 Units Subcutaneous QHS  . insulin aspart  0-9 Units Subcutaneous TID WC  . linezolid  600 mg Intravenous Q12H  . sodium chloride  3 mL Intravenous Q12H    Abtx:  Anti-infectives   Start     Dose/Rate Route Frequency Ordered Stop   08/12/14 2300  DAPTOmycin (CUBICIN) 1,000 mg in sodium chloride 0.9 % IVPB     1,000 mg 240 mL/hr over 30  Minutes Intravenous Every 48 hours 08/11/14 2038     08/11/14 2200  linezolid (ZYVOX) IVPB 600 mg     600 mg 300 mL/hr over 60 Minutes Intravenous Every 12 hours 08/11/14 1834        Total days of antibiotics: 8 (cubicin, zyvox)          Social History:  reports that he has never smoked. He has never used smokeless tobacco. He reports that he does not drink alcohol or use illicit drugs.  Family History  Problem Relation Age of Onset  . Diabetes Mother   . Hyperlipidemia Mother   . Hypertension Mother     General ROS: per wife has memory issues, vision normal, normal BM, normal urination, numbness RLE, see HPI   Blood pressure 125/45, pulse 94, temperature 99.2 F (37.3 C), temperature source Oral, resp. rate 18, height 6' 9"  (2.057 m), weight 170.552 kg (376 lb), SpO2 93.00%. General appearance: alert,  cooperative and no distress Eyes: negative findings: conjunctivae and sclerae normal and pupils equal, round, reactive to light and accomodation Throat: normal findings: oropharynx pink & moist without lesions or evidence of thrush Neck: no adenopathy and supple, symmetrical, trachea midline Lungs: clear to auscultation bilaterally Heart: regular rate and rhythm and systolic murmur: systolic ejection 3/6, crescendo at 2nd left intercostal space Abdomen: normal findings: bowel sounds normal and soft, non-tender Extremities: RLE with thickened crust, ulcer on medial side of calf, tender.    Results for orders placed during the hospital encounter of 08/11/14 (from the past 48 hour(s))  GLUCOSE, CAPILLARY     Status: Abnormal   Collection Time    08/11/14  5:07 PM      Result Value Ref Range   Glucose-Capillary 187 (*) 70 - 99 mg/dL   Comment 1 Documented in Chart     Comment 2 Notify RN    COMPREHENSIVE METABOLIC PANEL     Status: Abnormal   Collection Time    08/11/14  9:00 PM      Result Value Ref Range   Sodium 140  137 - 147 mEq/L   Potassium 5.3  3.7 - 5.3 mEq/L   Chloride 101  96 - 112 mEq/L   CO2 23  19 - 32 mEq/L   Glucose, Bld 176 (*) 70 - 99 mg/dL   BUN 79 (*) 6 - 23 mg/dL   Creatinine, Ser 5.51 (*) 0.50 - 1.35 mg/dL   Calcium 8.1 (*) 8.4 - 10.5 mg/dL   Total Protein 6.1  6.0 - 8.3 g/dL   Albumin 2.0 (*) 3.5 - 5.2 g/dL   AST 90 (*) 0 - 37 U/L   ALT 44  0 - 53 U/L   Alkaline Phosphatase 129 (*) 39 - 117 U/L   Total Bilirubin 1.3 (*) 0.3 - 1.2 mg/dL   GFR calc non Af Amer 11 (*) >90 mL/min   GFR calc Af Amer 13 (*) >90 mL/min   Comment: (NOTE)     The eGFR has been calculated using the CKD EPI equation.     This calculation has not been validated in all clinical situations.     eGFR's persistently <90 mL/min signify possible Chronic Kidney     Disease.   Anion gap 16 (*) 5 - 15  MAGNESIUM     Status: None   Collection Time    08/11/14  9:00 PM      Result Value Ref  Range   Magnesium 2.1  1.5 - 2.5 mg/dL  PHOSPHORUS     Status: Abnormal   Collection Time    08/11/14  9:00 PM      Result Value Ref Range   Phosphorus 5.1 (*) 2.3 - 4.6 mg/dL  PROTIME-INR     Status: Abnormal   Collection Time    08/11/14  9:00 PM      Result Value Ref Range   Prothrombin Time 16.6 (*) 11.6 - 15.2 seconds   INR 1.34  0.00 - 1.49  GLUCOSE, CAPILLARY     Status: Abnormal   Collection Time    08/11/14  9:57 PM      Result Value Ref Range   Glucose-Capillary 202 (*) 70 - 99 mg/dL  CBC WITH DIFFERENTIAL     Status: Abnormal   Collection Time    08/11/14 10:22 PM      Result Value Ref Range   WBC 21.5 (*) 4.0 - 10.5 K/uL   RBC 2.48 (*) 4.22 - 5.81 MIL/uL   Hemoglobin 7.1 (*) 13.0 - 17.0 g/dL   HCT 20.7 (*) 39.0 - 52.0 %   MCV 83.5  78.0 - 100.0 fL   MCH 28.6  26.0 - 34.0 pg   MCHC 34.3  30.0 - 36.0 g/dL   RDW 17.2 (*) 11.5 - 15.5 %   Platelets 223  150 - 400 K/uL   Neutrophils Relative % 86 (*) 43 - 77 %   Neutro Abs 18.5 (*) 1.7 - 7.7 K/uL   Lymphocytes Relative 7 (*) 12 - 46 %   Lymphs Abs 1.6  0.7 - 4.0 K/uL   Monocytes Relative 6  3 - 12 %   Monocytes Absolute 1.2 (*) 0.1 - 1.0 K/uL   Eosinophils Relative 1  0 - 5 %   Eosinophils Absolute 0.2  0.0 - 0.7 K/uL   Basophils Relative 0  0 - 1 %   Basophils Absolute 0.0  0.0 - 0.1 K/uL  GLUCOSE, CAPILLARY     Status: Abnormal   Collection Time    08/12/14  8:41 AM      Result Value Ref Range   Glucose-Capillary 196 (*) 70 - 99 mg/dL  CBC WITH DIFFERENTIAL     Status: Abnormal   Collection Time    08/12/14 10:12 AM      Result Value Ref Range   WBC 22.9 (*) 4.0 - 10.5 K/uL   RBC 2.55 (*) 4.22 - 5.81 MIL/uL   Hemoglobin 7.1 (*) 13.0 - 17.0 g/dL   HCT 21.6 (*) 39.0 - 52.0 %   MCV 84.7  78.0 - 100.0 fL   MCH 27.8  26.0 - 34.0 pg   MCHC 32.9  30.0 - 36.0 g/dL   RDW 17.5 (*) 11.5 - 15.5 %   Platelets 236  150 - 400 K/uL   Neutrophils Relative % 89 (*) 43 - 77 %   Neutro Abs 20.3 (*) 1.7 - 7.7 K/uL    Lymphocytes Relative 6 (*) 12 - 46 %   Lymphs Abs 1.3  0.7 - 4.0 K/uL   Monocytes Relative 5  3 - 12 %   Monocytes Absolute 1.2 (*) 0.1 - 1.0 K/uL   Eosinophils Relative 0  0 - 5 %   Eosinophils Absolute 0.1  0.0 - 0.7 K/uL   Basophils Relative 0  0 - 1 %   Basophils Absolute 0.0  0.0 - 0.1 K/uL  GLUCOSE, CAPILLARY     Status: Abnormal   Collection Time    08/12/14 10:14 AM  Result Value Ref Range   Glucose-Capillary 213 (*) 70 - 99 mg/dL      Component Value Date/Time   SDES BLOOD LEFT ARM 01/22/2014 0005   SPECREQUEST BOTTLES DRAWN AEROBIC ONLY 10CC 01/22/2014 0005   CULT  Value: NO GROWTH 5 DAYS Performed at [facility] 01/22/2014 0005   REPTSTATUS 01/28/2014 FINAL 01/22/2014 0005   Dg Chest Port 1 View  08/12/2014   CLINICAL DATA:  Shortness of breath.  EXAM: PORTABLE CHEST - 1 VIEW  COMPARISON:  August 04, 2014.  FINDINGS: Stable cardiomegaly. No pneumothorax is noted. Right lung is clear. Left perihilar and basilar opacity is noted concerning for edema or possibly pneumonia. No significant pleural effusion is noted. Bony thorax is intact.  IMPRESSION: Left perihilar and basilar opacity is concerning for edema or possibly pneumonia. Followup radiographs are recommended until resolution.   Electronically Signed   By: Sabino Dick M.D.   On: 08/12/2014 08:36   No results found for this or any previous visit (from the past 240 hour(s)).    08/12/2014, 11:59 AM     LOS: 1 day

## 2014-08-12 NOTE — Progress Notes (Signed)
Patient Name: Angel Mays Date of Encounter: 08/12/2014  Active Problems:   CKD (chronic kidney disease)   Leg ulcer   Diabetes mellitus   Bacterial endocarditis   Endocarditis    Patient Profile: 43 yo male w/ hx DM, L-BKA, HTN, MRSA sepsis 06/2014, hx DVT w/ IVC filter, chronic venous stasis w/ probable infected ulcers RLE, CKD on HD (hopefully temporary). Admitted to [facility] for fever & troponin elevation, peak 1.30. TEE showed large AoV vegetation causing AI. TCTS has seen, not a surgical candidate.  SUBJECTIVE: Very weak, some SOB, no change from yesterday, no chest pain. Poor appetite, not eating, family trying to get him to take supplements.  OBJECTIVE Filed Vitals:   08/11/14 1700 08/11/14 2152 08/12/14 0450  BP: 104/41 139/66 125/45  Pulse: 91 95 94  Temp: 98.3 F (36.8 C) 98.6 F (37 C) 99.2 F (37.3 C)  TempSrc: Oral Oral Oral  Resp: Height:  (2.057 m)    Weight: 376 lb (170.552 kg)    SpO2: 96% 98% 93%   No intake or output data in the 24 hours ending 08/12/14 0755 Filed Weights   08/11/14 1700  Weight: 376 lb (170.552 kg)    PHYSICAL EXAM General: Chronically ill-appearing, obese, male in no acute distress. Head: Normocephalic, atraumatic.  Neck: Supple without bruits, JVD 10 cm. Lungs:  Resp regular and unlabored, rales bases. Heart: RRR, S1, S2, no S3, S4, SEM/DM murmur; no rub. Abdomen: Soft, non-tender, non-distended, BS + x 4.  Extremities: No clubbing, cyanosis, no edema arms or RLE Neuro: Alert and oriented X 3. Moves all extremities spontaneously. Psych: ?Depressed affect.  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 It was estimated at 1 cm in size. It extended to the aortic root with systole and back into the LVOT in diastole. There was significant aortic valve regurgitation which was estimated to be moderate to severe. There was an aneurysmal atrial septum and a suspected small PFO.    Current Medications:  . ARIPiprazole  5 mg Oral Daily  . DAPTOmycin (CUBICIN)  IV  1,000 mg Intravenous Q48H  . feeding supplement (NEPRO CARB STEADY)  237 mL Oral TID BM  . feeding supplement (PRO-STAT SUGAR FREE 64)  30 mL Oral BID  . insulin aspart  0-5 Units Subcutaneous QHS  . insulin aspart  0-9 Units Subcutaneous TID WC  . linezolid  600 mg Intravenous Q12H  . sodium chloride  3 mL Intravenous Q12H      ASSESSMENT AND PLAN: Active Problems:   CKD (chronic kidney disease) - no urine output per wife, last HD was 2 days ago. Will send a message to IM    Leg ulcer - per IM  Diabetes mellitus - per IM    Bacterial endocarditis - per TCTS, not a surgical candidate. Per wife, they have decided against a DNR. ABX per IM +/- ID.    Endocarditis - see above  Plan - since not a surgical candidate, no further cardiac workup at this time. Volume management will be through HD. MD advise on seeing patient PRN.  Signed, Theodore Demark , PA-C 7:55 AM 08/12/2014   I have seen and evaluated the patient this AM along with Theodore Demark , PA-C. I agree with her findings, examination as well as impression recommendations.  Very difficult situation with MRSA sepsis/bacteremia & now with large AoV endocarditis (as well as likely Osteomyelitis). Seen by TCTS yesterday -- Not felt to be surgical candidate based upon co-morbidities & dismal chance of survival post-op.  We  are now in a very tenuous scenario with likely severe AI & large vegetation + osteo.  Very poor overall prognosis as noted by both Drs. Hendrickson & Hilty.   Family not yet ready to decide re: Full Code vs. DNR.    I spent ~30 min talking with the wife (& intermittently with the patient) about his prognosis --> she seems realistic,but is having a hard time adjusting to things.  Palliative Care consult may be prudent, but for now, he is "tired" of discussing this.  With what is at least Subacute to Acute AI - progressive LV failure is always possible with pulmonary edema).  TEE did not suggest abscess or fistulae which would be more foreboding  Mainstay of Rx remains Rx is IV Abx, modest afterload reduction & HD to avoid volume overload  We will check in to morrow & be available over the weekend if needed.  MD Time with pt: 35 min  Henderson Frampton W, M.D., M.S. Interventional Cardiologist   Pager # [phone]

## 2014-08-12 NOTE — Consult Note (Signed)
Reason for Consult: Chronic Renal Failure, anuria.  Referring Physician: Dr. Eulogio Bear HPI: Angel Mays is an 43 y.o. 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 Also noted to have large aortic valve vegetation on TEE on 08/11/14. Mr. Yeshaya has a very complex past medical history going back to 2013 when he had his L. BKA 2/2 chronic venous stasis ulcers w/ resulting severe infection. After this procedure, patient's renal function declined significantly requiring HD. Since that time, he has been in and out of skilled nursing facilities for care of his chronic medical issues and has required HD other times since then, according to the patient's wife. Per chart review, patient was to have L brachiocephalic fistula placed w/ Dr. Trula Slade in February or March of this year but never underwent the procedure. He also has seen Dr. [name] on 03/24/14 and was deemed a poor candidate for HD at that time given severity of chronic medical issues. In 06/2014, patient was admitted to [facility] for ?pneumonia and found to have MRSA bacteremia. He was discharged to [facility] w/ a PICC and treated w/ a 4 week course of Vancomycin. The patient continued to have fevers while on Vancomycin, followed up at ID clinic and again noted to have positive blood cultures and sent to [facility] on 08/07/14.   PMH:   Past Medical History  Diagnosis Date  . Normocytic anemia   . Diabetes mellitus with complication   . GERD (gastroesophageal reflux disease)   . Hypertension   . Venous stasis ulcers     a. a. 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 RLE with recurrent cellulitis.  . Morbid obesity   . CKD  (chronic kidney disease), stage III   . Gout   . Sepsis     a. 06/2014 MRSA Sepsis->4 wks of Vanc;  b. 06/2014 Echo: EF 55-60%, mild conc LVH, mod dil LA/RA, mild Ao sclerosis w/o stenosis, no veg/mass.  . Peripheral neuropathy   . Depression   . Cataract   . Seasonal allergies   . Abdominal abscess     a. s/p prior drainage.  Marland Kitchen DVT (deep venous thrombosis)     a. On Eliquis, s/p IVC filter.    PSH:   Past Surgical History  Procedure Laterality Date  . Below knee leg amputation Left     2013  . Right rotator cuff surgery x 2    . Right hand surgery      Allergies:  Allergies  Allergen Reactions  . Omnipaque [Iohexol]     Medications:   Prior to Admission medications   Medication Sig Start Date End Date Taking? Authorizing Provider  albuterol (ACCUNEB) 0.63 MG/3ML nebulizer solution Take 1 ampule by nebulization every 6 (six) hours as needed for wheezing.    Historical Provider, MD  albuterol (PROVENTIL) (2.5 MG/3ML) 0.083% nebulizer solution Take 3 mLs (2.5 mg total) by nebulization every 6 (six) hours as needed for wheezing or shortness of breath. 01/28/14   Allie Bossier, MD  allopurinol (ZYLOPRIM) 100 MG tablet Take 100 mg by mouth daily.    Historical Provider, MD  Amino Acids-Protein Hydrolys (FEEDING SUPPLEMENT, PRO-STAT SUGAR FREE 64,) LIQD  Take 30 mLs by mouth 2 (two) times daily. 01/28/14   Allie Bossier, MD  amLODipine (NORVASC) 10 MG tablet Take 10 mg by mouth daily.    Historical Provider, MD  apixaban (ELIQUIS) 5 MG TABS tablet Take 1 tablet (5 mg total) by mouth 2 (two) times daily. 01/28/14   Allie Bossier, MD  B Complex-C-Folic Acid (NEPHRO-VITE PO) Take by mouth daily.    Historical Provider, MD  beta carotene (CVS BETA CAROTENE) 44818 UNIT capsule Take 25,000 Units by mouth daily.    Historical Provider, MD  carvedilol (COREG) 25 MG tablet Take 25 mg by mouth 2 (two) times daily with a meal.    Historical Provider, MD  Cholecalciferol (VITAMIN D-3) 5000 UNITS  TABS Take 1 tablet by mouth. Monday-Friday    Historical Provider, MD  clindamycin (CLEOCIN) 300 MG capsule Take 1 capsule (300 mg total) by mouth 3 (three) times daily. 01/28/14   Allie Bossier, MD  cloNIDine (CATAPRES) 0.2 MG tablet Take 0.2 mg by mouth 2 (two) times daily.    Historical Provider, MD  collagenase (SANTYL) ointment Apply topically daily. 01/28/14   Allie Bossier, MD  diphenhydrAMINE (SOMINEX) 25 MG tablet Take 25 mg by mouth at bedtime as needed for sleep.    Historical Provider, MD  ferrous gluconate (FERGON) 324 MG tablet Take 324 mg by mouth 2 (two) times daily with a meal.    Historical Provider, MD  fexofenadine (ALLEGRA) 180 MG tablet Take 180 mg by mouth daily.    Historical Provider, MD  fluticasone (FLONASE) 50 MCG/ACT nasal spray Place 2 sprays into both nostrils daily.    Historical Provider, MD  gabapentin (NEURONTIN) 300 MG capsule Take 300 mg by mouth 3 (three) times daily.    Historical Provider, MD  heparin 1000 unit/mL SOLN injection 4.1 mLs (4,100 Units total) by Dialysis route as needed (in dialysis). 01/28/14   Allie Bossier, MD  hydrALAZINE (APRESOLINE) 100 MG tablet Take 100 mg by mouth 3 (three) times daily.    Historical Provider, MD  insulin aspart (NOVOLOG) 100 UNIT/ML injection Inject 0-9 Units into the skin 3 (three) times daily with meals. 01/28/14   Allie Bossier, MD  lidocaine, PF, (XYLOCAINE) 1 % SOLN injection Inject 5 mLs into the skin as needed (topical anesthesia for hemodialysis ifGEBAUERS is ineffective.). 01/28/14   Allie Bossier, MD  lidocaine-prilocaine (EMLA) cream Apply 1 application topically as needed (topical anesthesia for hemodialysis if Gebauers and Lidocaine injection are ineffective.). 01/28/14   Allie Bossier, MD  metoCLOPramide (REGLAN) 5 MG tablet Take 1 tablet (5 mg total) by mouth 3 (three) times daily before meals. 01/28/14   Allie Bossier, MD  Nutritional Supplements (FEEDING SUPPLEMENT, NEPRO CARB STEADY,) LIQD Take 237 mLs  by mouth 3 (three) times daily between meals. 01/28/14   Allie Bossier, MD  ondansetron (ZOFRAN) 4 MG tablet Take 4 mg by mouth every 8 (eight) hours as needed for nausea or vomiting.    Historical Provider, MD  oxyCODONE 10 MG TABS Take 1 tablet (10 mg total) by mouth every 6 (six) hours as needed for severe pain. 01/28/14   Allie Bossier, MD  pantoprazole (PROTONIX) 40 MG tablet Take 1 tablet (40 mg total) by mouth daily. 01/28/14   Allie Bossier, MD  pentafluoroprop-tetrafluoroeth Landry Dyke) AERO Apply 1 application topically as needed for mild pain (topical anesthesia for hemodialysis). 01/28/14   Allie Bossier, MD  pentoxifylline (TRENTAL) 400 MG  CR tablet Take 400 mg by mouth 3 (three) times daily with meals.    Historical Provider, MD  polyethylene glycol (MIRALAX / GLYCOLAX) packet Take 17 g by mouth daily.    Historical Provider, MD  prednisoLONE acetate (PRED FORTE) 1 % ophthalmic suspension Place 1 drop into the left eye 4 (four) times daily.    Historical Provider, MD  promethazine (PHENERGAN) 25 MG suppository Place 25 mg rectally every 8 (eight) hours as needed for nausea or vomiting.    Historical Provider, MD  senna (SENOKOT) 8.6 MG tablet Take 2 tablets by mouth daily.    Historical Provider, MD  sertraline (ZOLOFT) 25 MG tablet Take 75 mg by mouth daily.    Historical Provider, MD  simethicone (MYLICON) 80 MG chewable tablet Chew 80 mg by mouth every 6 (six) hours as needed for flatulence.    Historical Provider, MD    Discontinued Meds:   Medications Discontinued During This Encounter  Medication Reason  . mirtazapine (REMERON) tablet 30 mg Change in therapy  . sertraline (ZOLOFT) tablet 150 mg Change in therapy  . sertraline (ZOLOFT) 25 MG tablet Dose change  . collagenase (SANTYL) ointment Formulary change  . Amino Acids-Protein Hydrolys (FEEDING SUPPLEMENT, PRO-STAT SUGAR FREE 64,) LIQD Inpatient Standard  . clindamycin (CLEOCIN) 300 MG capsule Completed Course  . heparin  1000 unit/mL SOLN injection Inpatient Standard  . lidocaine, PF, (XYLOCAINE) 1 % SOLN injection Inpatient Standard  . lidocaine-prilocaine (EMLA) cream Inpatient Standard  . Nutritional Supplements (FEEDING SUPPLEMENT, NEPRO CARB STEADY,) LIQD Inpatient Standard  . pentafluoroprop-tetrafluoroeth (GEBAUERS) AERO Inpatient Standard     Family History:   Family History  Problem Relation Age of Onset  . Diabetes Mother   . Hyperlipidemia Mother   . Hypertension Mother     Social History:  reports that he has never smoked. He has never used smokeless tobacco. He reports that he does not drink alcohol or use illicit drugs. Review of Systems  Constitutional: Positive for malaise/fatigue. Negative for fever, chills and weight loss.  HENT: Negative for congestion and sore throat.   Eyes: Negative for blurred vision and double vision.  Respiratory: Positive for cough and shortness of breath. Negative for hemoptysis, sputum production and wheezing.   Cardiovascular: Positive for leg swelling. Negative for chest pain, palpitations, orthopnea and PND.  Gastrointestinal: Positive for abdominal pain and constipation. Negative for heartburn, nausea, vomiting, diarrhea, blood in stool and melena.  Genitourinary: Negative for dysuria, urgency and frequency.       Currently anuric.  Neurological: Positive for weakness. Negative for dizziness, tremors, seizures, loss of consciousness and headaches.  Psychiatric/Behavioral: Positive for depression. Negative for suicidal ideas and substance abuse.    Blood pressure 125/45, pulse 94, temperature 99.2 F (37.3 C), temperature source Oral, resp. rate 18, height $RemoveBe'6\' 9"'[id number]$  (2.057 m), weight 376 lb (170.552 kg), SpO2 93.00%.  Physical Exam  General: Morbidly obese male, alert, cooperative, NAD. Chronically ill-appearing.  HEENT: PERRL, EOMI. Moist mucus membranes. Slight facial asymmetry.  Neck: Full range of motion without pain, supple, no lymphadenopathy or  carotid bruits Lungs: Mild tachypnea. Air entry equal bilaterally, mild rales at bases. No wheezes.  Heart: Regular rate and rhythm, 3/6 combined systolic/diastolic murmur heard, loudest at left upper sternal border. No rubs or gallops.  Abdomen: Obese, tender in LUQ and suprapubic region. BS present. Left femoral HD catheter present.  Extremities: RLE w/ severe swelling, erythema, and venous stasis changes. Foot w/ large overlying scab/chronic skin changes on dorsum of  the foot. Leg w/ large overlying eschar. Underlying skin erythematous, edematous. LLE w/ BKA.  Neurologic: Alert & oriented x3, cranial nerves grossly intact, strength grossly intact. Depressed mood, flat affect.     Creatinine, Ser  Date/Time Value Ref Range Status  08/12/2014 12:05 PM 6.26* 0.50 - 1.35 mg/dL Final  08/11/2014  9:00 PM 5.51* 0.50 - 1.35 mg/dL Final  02/21/2014  6:28 AM 2.54* 0.50 - 1.35 mg/dL Final  02/20/2014  4:05 AM 2.60* 0.50 - 1.35 mg/dL Final  02/19/2014  5:00 AM 2.50* 0.50 - 1.35 mg/dL Final  02/15/2014  5:50 AM 2.33* 0.50 - 1.35 mg/dL Final  02/12/2014  6:30 AM 2.38* 0.50 - 1.35 mg/dL Final  02/10/2014  5:02 AM 2.20* 0.50 - 1.35 mg/dL Final  02/09/2014  5:00 AM 2.16* 0.50 - 1.35 mg/dL Final  02/08/2014  6:14 AM 2.18* 0.50 - 1.35 mg/dL Final  02/07/2014  5:15 AM 2.20* 0.50 - 1.35 mg/dL Final  02/05/2014  6:40 AM 2.27* 0.50 - 1.35 mg/dL Final  02/04/2014  6:40 AM 2.08* 0.50 - 1.35 mg/dL Final  02/04/2014  6:40 AM 2.02* 0.50 - 1.35 mg/dL Final  02/03/2014  6:40 AM 2.24* 0.50 - 1.35 mg/dL Final  02/02/2014  5:48 AM 2.09* 0.50 - 1.35 mg/dL Final  02/01/2014  6:00 AM 2.20* 0.50 - 1.35 mg/dL Final  01/31/2014  5:42 AM 2.22* 0.50 - 1.35 mg/dL Final  01/30/2014  7:13 AM 2.24* 0.50 - 1.35 mg/dL Final  01/29/2014  6:53 AM 2.33* 0.50 - 1.35 mg/dL Final  01/28/2014  4:25 AM 2.11* 0.50 - 1.35 mg/dL Final  01/26/2014  8:15 AM 2.27* 0.50 - 1.35 mg/dL Final  01/25/2014 10:53 PM 2.41* 0.50 - 1.35 mg/dL Final  01/23/2014  9:10 AM 3.17*  0.50 - 1.35 mg/dL Final  01/22/2014  9:30 PM 4.19* 0.50 - 1.35 mg/dL Final  01/22/2014  8:00 AM 4.56* 0.50 - 1.35 mg/dL Final  01/22/2014 12:05 AM 4.71* 0.50 - 1.35 mg/dL Final    Results for orders placed during the hospital encounter of 08/11/14 (from the past 48 hour(s))  GLUCOSE, CAPILLARY     Status: Abnormal   Collection Time    08/11/14  5:07 PM      Result Value Ref Range   Glucose-Capillary 187 (*) 70 - 99 mg/dL   Comment 1 Documented in Chart     Comment 2 Notify RN    COMPREHENSIVE METABOLIC PANEL     Status: Abnormal   Collection Time    08/11/14  9:00 PM      Result Value Ref Range   Sodium 140  137 - 147 mEq/L   Potassium 5.3  3.7 - 5.3 mEq/L   Chloride 101  96 - 112 mEq/L   CO2 23  19 - 32 mEq/L   Glucose, Bld 176 (*) 70 - 99 mg/dL   BUN 79 (*) 6 - 23 mg/dL   Creatinine, Ser 5.51 (*) 0.50 - 1.35 mg/dL   Calcium 8.1 (*) 8.4 - 10.5 mg/dL   Total Protein 6.1  6.0 - 8.3 g/dL   Albumin 2.0 (*) 3.5 - 5.2 g/dL   AST 90 (*) 0 - 37 U/L   ALT 44  0 - 53 U/L   Alkaline Phosphatase 129 (*) 39 - 117 U/L   Total Bilirubin 1.3 (*) 0.3 - 1.2 mg/dL   GFR calc non Af Amer 11 (*) >90 mL/min   GFR calc Af Amer 13 (*) >90 mL/min   Comment: (NOTE)  The eGFR has been calculated using the CKD EPI equation.     This calculation has not been validated in all clinical situations.     eGFR's persistently <90 mL/min signify possible Chronic Kidney     Disease.   Anion gap 16 (*) 5 - 15  MAGNESIUM     Status: None   Collection Time    08/11/14  9:00 PM      Result Value Ref Range   Magnesium 2.1  1.5 - 2.5 mg/dL  PHOSPHORUS     Status: Abnormal   Collection Time    08/11/14  9:00 PM      Result Value Ref Range   Phosphorus 5.1 (*) 2.3 - 4.6 mg/dL  PROTIME-INR     Status: Abnormal   Collection Time    08/11/14  9:00 PM      Result Value Ref Range   Prothrombin Time 16.6 (*) 11.6 - 15.2 seconds   INR 1.34  0.00 - 1.49  GLUCOSE, CAPILLARY     Status: Abnormal   Collection Time     08/11/14  9:57 PM      Result Value Ref Range   Glucose-Capillary 202 (*) 70 - 99 mg/dL  CBC WITH DIFFERENTIAL     Status: Abnormal   Collection Time    08/11/14 10:22 PM      Result Value Ref Range   WBC 21.5 (*) 4.0 - 10.5 K/uL   RBC 2.48 (*) 4.22 - 5.81 MIL/uL   Hemoglobin 7.1 (*) 13.0 - 17.0 g/dL   HCT 20.7 (*) 39.0 - 52.0 %   MCV 83.5  78.0 - 100.0 fL   MCH 28.6  26.0 - 34.0 pg   MCHC 34.3  30.0 - 36.0 g/dL   RDW 17.2 (*) 11.5 - 15.5 %   Platelets 223  150 - 400 K/uL   Neutrophils Relative % 86 (*) 43 - 77 %   Neutro Abs 18.5 (*) 1.7 - 7.7 K/uL   Lymphocytes Relative 7 (*) 12 - 46 %   Lymphs Abs 1.6  0.7 - 4.0 K/uL   Monocytes Relative 6  3 - 12 %   Monocytes Absolute 1.2 (*) 0.1 - 1.0 K/uL   Eosinophils Relative 1  0 - 5 %   Eosinophils Absolute 0.2  0.0 - 0.7 K/uL   Basophils Relative 0  0 - 1 %   Basophils Absolute 0.0  0.0 - 0.1 K/uL  GLUCOSE, CAPILLARY     Status: Abnormal   Collection Time    08/12/14  8:41 AM      Result Value Ref Range   Glucose-Capillary 196 (*) 70 - 99 mg/dL  CBC WITH DIFFERENTIAL     Status: Abnormal   Collection Time    08/12/14 10:12 AM      Result Value Ref Range   WBC 22.9 (*) 4.0 - 10.5 K/uL   RBC 2.55 (*) 4.22 - 5.81 MIL/uL   Hemoglobin 7.1 (*) 13.0 - 17.0 g/dL   HCT 21.6 (*) 39.0 - 52.0 %   MCV 84.7  78.0 - 100.0 fL   MCH 27.8  26.0 - 34.0 pg   MCHC 32.9  30.0 - 36.0 g/dL   RDW 17.5 (*) 11.5 - 15.5 %   Platelets 236  150 - 400 K/uL   Neutrophils Relative % 89 (*) 43 - 77 %   Neutro Abs 20.3 (*) 1.7 - 7.7 K/uL   Lymphocytes Relative 6 (*) 12 - 46 %  Lymphs Abs 1.3  0.7 - 4.0 K/uL   Monocytes Relative 5  3 - 12 %   Monocytes Absolute 1.2 (*) 0.1 - 1.0 K/uL   Eosinophils Relative 0  0 - 5 %   Eosinophils Absolute 0.1  0.0 - 0.7 K/uL   Basophils Relative 0  0 - 1 %   Basophils Absolute 0.0  0.0 - 0.1 K/uL  GLUCOSE, CAPILLARY     Status: Abnormal   Collection Time    08/12/14 10:14 AM      Result Value Ref Range    Glucose-Capillary 213 (*) 70 - 99 mg/dL  COMPREHENSIVE METABOLIC PANEL     Status: Abnormal   Collection Time    08/12/14 12:05 PM      Result Value Ref Range   Sodium 142  137 - 147 mEq/L   Potassium 5.5 (*) 3.7 - 5.3 mEq/L   Chloride 101  96 - 112 mEq/L   CO2 23  19 - 32 mEq/L   Glucose, Bld 211 (*) 70 - 99 mg/dL   BUN 83 (*) 6 - 23 mg/dL   Creatinine, Ser 6.26 (*) 0.50 - 1.35 mg/dL   Calcium 8.1 (*) 8.4 - 10.5 mg/dL   Total Protein 6.3  6.0 - 8.3 g/dL   Albumin 2.1 (*) 3.5 - 5.2 g/dL   AST 78 (*) 0 - 37 U/L   ALT 52  0 - 53 U/L   Alkaline Phosphatase 126 (*) 39 - 117 U/L   Total Bilirubin 1.4 (*) 0.3 - 1.2 mg/dL   GFR calc non Af Amer 10 (*) >90 mL/min   GFR calc Af Amer 11 (*) >90 mL/min   Comment: (NOTE)     The eGFR has been calculated using the CKD EPI equation.     This calculation has not been validated in all clinical situations.     eGFR's persistently <90 mL/min signify possible Chronic Kidney     Disease.   Anion gap 18 (*) 5 - 15  MAGNESIUM     Status: None   Collection Time    08/12/14 12:05 PM      Result Value Ref Range   Magnesium 2.1  1.5 - 2.5 mg/dL  GLUCOSE, CAPILLARY     Status: Abnormal   Collection Time    08/12/14 12:28 PM      Result Value Ref Range   Glucose-Capillary 204 (*) 70 - 99 mg/dL    Dg Chest Port 1 View  08/12/2014   CLINICAL DATA:  Shortness of breath.  EXAM: PORTABLE CHEST - 1 VIEW  COMPARISON:  August 04, 2014.  FINDINGS: Stable cardiomegaly. No pneumothorax is noted. Right lung is clear. Left perihilar and basilar opacity is noted concerning for edema or possibly pneumonia. No significant pleural effusion is noted. Bony thorax is intact.  IMPRESSION: Left perihilar and basilar opacity is concerning for edema or possibly pneumonia. Followup radiographs are recommended until resolution.   Electronically Signed   By: Sabino Dick M.D.   On: 08/12/2014 08:36    Assessment/Plan: 43 y/o M w/ multiple co-morbidities, most significant  for LE chronic venous stasis changes and RLE leg infection, RLE BKA, DM type II, CKD stage 5, transferred from [facility] w/ MRSA bacteremia, found to have large aortic vegetation on TEE. Also w/ worsening renal failure requiring HD via temporary left femoral HD catheter.   CKD stage 5- Patient w/ known history of chronic kidney disease, most likely 2/2 poorly controlled DM in the  past. Baseline Cr relatively unclear, but has previously been ~2.5 earlier this year. Patient has required HD several times in the past, according to the patient's wife. He had LLE BKA in 2013 and has been having kidney issues ever since. Has also followed w/ Dr. [name] in clinic in 03/2014 and deemed to be a poor candidate for HD at that time. Patient is currently anuric (past 2 days), and had received 4 sessions of HD at [facility] since 08/07/14 via a left femoral HD catheter. Last HD was 08/10/14. This is a very difficult situation as the patient has a severe leg infection and MRSA bacteremia w/ an aortic valve vegetation. Almost certainly his right femoral access is now infected. Will get HD tomorrow and Saturday, then remove femoral HD catheter over the weekend. On Monday, will have new HD catheter placed. Feel that goals of care discussion w/ patient and family is important as well given several severe medical issues.  Hyperkalemia- Mild, K 5.5. EKG without significant changes. Continue to monitor.  MRSA bacteremia- Had previously been treated for MRSA bacteremia in 06/2014 w/ 4 week course of Vancomycin. Recent blood cultures from 08/08/14 still confirm presence of MRSA. ID consulted, currently on Linezolid and Daptomycin. Added Rifampin today.  Aortic Valve Infective Endocarditis- TEE at [place] from 08/11/14 shows large 1 cm vegetation on the aortic valve. Seen by CVTS yesterday, feel that survival from aortic root replacement is almost zero.  RLE cellulitis/likely osteomyelitis- Antibiotics as above. Would consider further  imaging for osteomyelitis and surgical evaluation. Will likely need RLE amputation at some point as well.  H/o DVT- On Eliquis at home, also w/ IVC filter, likely infected 2/2 bacteremia as well. Possible to remove? Normocytic Anemia- Baseline Hb 9. 7.1 on admission. Continue to monitor.   Luanne Bras 08/12/2014, 3:28 PM  PGY-2, Internal Medicine Pager: [phone]  Renal Attending. This pt has CKD (cr 2.5 in April) due to diabetes with a history of dialysis in the past due to AKI.  He has MRSA bacteremia with endocarditis involving the aortic valve with large vegetations.  He is currently anuric.  He has a left femoral HD catheter in place 4 days.  There are co-morbidities described above.  His chance for recovery of renal function is small but not zero.  He could have ATN related to cytokines from bacteremia or immune complex mediatedGN due to endocarditis, superimposed upon the CKD.  He will need the catheter removed from the groin(perhaps Saturday) and given a couple of days sans catheter.  An IJ PC will need to be placed Monday or  Tuesday.  He is depressed.  He has tremendous family support and they have some unrealistic expectations.  His relative youth is his friend, but he will probably need another amputation, possible long term dialysis and long term antibiotic treatment if valvular surgery not an option.  The issue of long term dialysis is worthy of discussion at a later date, but the wife seems committed to pursuing many avenues and even raised the question of transplantation which we informed her was not possible nor appropriate to discuss. Niza Soderholm C

## 2014-08-12 NOTE — Progress Notes (Signed)
Inpatient Diabetes Program Recommendations  AACE/ADA: New Consensus Statement on Inpatient Glycemic Control (2013)  Target Ranges:  Prepandial:   less than 140 mg/dL      Peak postprandial:   less than 180 mg/dL (1-2 hours)      Critically ill patients:  140 - 180 mg/dL   Inpatient Diabetes Program Recommendations Insulin - Basal: add patient's home dose of Levemir  Thank you  Piedad Climes BSN, RN,CDE Inpatient Diabetes Coordinator [phone] (team pager)

## 2014-08-13 ENCOUNTER — Inpatient Hospital Stay (HOSPITAL_COMMUNITY): Payer: Medicare Other

## 2014-08-13 DIAGNOSIS — Z7189 Other specified counseling: Secondary | ICD-10-CM

## 2014-08-13 DIAGNOSIS — R5383 Other fatigue: Secondary | ICD-10-CM

## 2014-08-13 DIAGNOSIS — R5381 Other malaise: Secondary | ICD-10-CM

## 2014-08-13 DIAGNOSIS — Z515 Encounter for palliative care: Secondary | ICD-10-CM

## 2014-08-13 LAB — RENAL FUNCTION PANEL
Albumin: 2.1 g/dL — ABNORMAL LOW (ref 3.5–5.2)
Anion gap: 15 (ref 5–15)
BUN: 86 mg/dL — AB (ref 6–23)
CO2: 22 mEq/L (ref 19–32)
Calcium: 8 mg/dL — ABNORMAL LOW (ref 8.4–10.5)
Chloride: 98 mEq/L (ref 96–112)
Creatinine, Ser: 6.48 mg/dL — ABNORMAL HIGH (ref 0.50–1.35)
GFR calc Af Amer: 11 mL/min — ABNORMAL LOW (ref >90)
GFR, EST NON AFRICAN AMERICAN: 9 mL/min — AB (ref >90)
Glucose, Bld: 156 mg/dL — ABNORMAL HIGH (ref 70–99)
PHOSPHORUS: 5.8 mg/dL — AB (ref 2.3–4.6)
POTASSIUM: 5.4 meq/L — AB (ref 3.7–5.3)
Sodium: 135 mEq/L — ABNORMAL LOW (ref 137–147)

## 2014-08-13 LAB — CBC WITH DIFFERENTIAL/PLATELET
BASOS ABS: 0.1 10*3/uL (ref 0.0–0.1)
BASOS PCT: 0 % (ref 0–1)
EOS PCT: 1 % (ref 0–5)
Eosinophils Absolute: 0.2 10*3/uL (ref 0.0–0.7)
HEMATOCRIT: 20.6 % — AB (ref 39.0–52.0)
Hemoglobin: 6.8 g/dL — CL (ref 13.0–17.0)
Lymphocytes Relative: 9 % — ABNORMAL LOW (ref 12–46)
Lymphs Abs: 1.9 10*3/uL (ref 0.7–4.0)
MCH: 27.9 pg (ref 26.0–34.0)
MCHC: 33 g/dL (ref 30.0–36.0)
MCV: 84.4 fL (ref 78.0–100.0)
MONO ABS: 1.3 10*3/uL — AB (ref 0.1–1.0)
Monocytes Relative: 6 % (ref 3–12)
Neutro Abs: 17.9 10*3/uL — ABNORMAL HIGH (ref 1.7–7.7)
Neutrophils Relative %: 84 % — ABNORMAL HIGH (ref 43–77)
PLATELETS: 251 10*3/uL (ref 150–400)
RBC: 2.44 MIL/uL — ABNORMAL LOW (ref 4.22–5.81)
RDW: 17.5 % — AB (ref 11.5–15.5)
WBC: 21.4 10*3/uL — ABNORMAL HIGH (ref 4.0–10.5)

## 2014-08-13 LAB — GLUCOSE, CAPILLARY
GLUCOSE-CAPILLARY: 156 mg/dL — AB (ref 70–99)
GLUCOSE-CAPILLARY: 181 mg/dL — AB (ref 70–99)
Glucose-Capillary: 111 mg/dL — ABNORMAL HIGH (ref 70–99)

## 2014-08-13 LAB — URINE CULTURE
CULTURE: NO GROWTH
Colony Count: NO GROWTH

## 2014-08-13 LAB — PREPARE RBC (CROSSMATCH)

## 2014-08-13 LAB — HEPATITIS B SURFACE ANTIGEN: Hepatitis B Surface Ag: NEGATIVE

## 2014-08-13 MED ORDER — GLUCERNA SHAKE PO LIQD
237.0000 mL | Freq: Three times a day (TID) | ORAL | Status: DC
  Administered 2014-08-14 – 2014-08-20 (×11): 237 mL via ORAL

## 2014-08-13 MED ORDER — SODIUM CHLORIDE 0.9 % IV SOLN: Freq: Once | INTRAVENOUS | Status: DC

## 2014-08-13 MED ORDER — LORAZEPAM 2 MG/ML IJ SOLN: 2.0000 mg | Freq: Once | INTRAMUSCULAR | Status: DC

## 2014-08-13 MED ORDER — LORAZEPAM 2 MG/ML IJ SOLN
1.0000 mg | Freq: Once | INTRAMUSCULAR | Status: AC
  Administered 2014-08-13: 1 mg via INTRAVENOUS

## 2014-08-13 MED ORDER — LORAZEPAM 2 MG/ML IJ SOLN: 1.0000 mg | INTRAMUSCULAR | Status: DC | PRN

## 2014-08-13 MED ORDER — HEPARIN SODIUM (PORCINE) 1000 UNIT/ML DIALYSIS: 20.0000 [IU]/kg | INTRAMUSCULAR | Status: DC | PRN

## 2014-08-13 MED ORDER — LORAZEPAM 2 MG/ML IJ SOLN
1.0000 mg | Freq: Four times a day (QID) | INTRAMUSCULAR | Status: DC | PRN
  Administered 2014-08-13: 1 mg via INTRAVENOUS
  Filled 2014-08-13 (×2): qty 1

## 2014-08-13 MED ORDER — HYDROCERIN EX CREA
1.0000 "application " | TOPICAL_CREAM | Freq: Every day | CUTANEOUS | Status: DC
  Administered 2014-08-14 – 2014-08-20 (×7): 1 via TOPICAL
  Filled 2014-08-13: qty 113

## 2014-08-13 NOTE — Plan of Care (Signed)
Problem: Phase I Progression Outcomes Goal: Hemodynamically stable Outcome: Progressing VS remain stable for patient.  Continues to receive multiple antibiotics for treatment of endocarditis and RLE wound.  Afebrile this shift.  Remains with flat affect.  No acute events occurred this shift.  Will continue to monitor patient condition.

## 2014-08-13 NOTE — Progress Notes (Signed)
Family requested that a chaplain return this afternoon.  Wille Glaser 08/13/2014 11:26 AM

## 2014-08-13 NOTE — Progress Notes (Addendum)
CRITICAL VALUE ALERT  Critical value received:  Positive Blood Cultures- Aerobic Gram + Cocci in Clusters  Date of notification:  08/13/2014  Time of notification:  1040  Critical value read back:Yes.    Nurse who received alert:  Candace Cruise, RN  MD notified (1st page):  Dr. Janee Morn (verbal notification)  Time of first page:  1041  MD notified (2nd page):  Time of second page:  Responding MD:  Dr. Janee Morn  Time MD responded:  1041

## 2014-08-13 NOTE — Progress Notes (Signed)
INITIAL NUTRITION ASSESSMENT  DOCUMENTATION CODES Per approved criteria  -Morbid Obesity -Severe Malnutrition in Chronic illness  Pt meets criteria for severe MALNUTRITION in the context of chronic illness as evidenced by 21% weight loss in <1 year and reported intake <75% of estimated needs for >1 month.  INTERVENTION: - D/C Nepro Shake - Glucerna Shake po TID, each supplement provides 220 kcal and 10 grams of protein. - Continue Prostat BID, each supplement provides 100 kcal and 15 g protein. - RD will continue to monitor for nutrition care plan.  NUTRITION DIAGNOSIS: Inadequate oral intake related to aortic valvular endocarditis as evidenced by wt loss.   Goal: Pt to meet >/= 90% of their estimated nutrition needs   Monitor:  Weight, labs, I/O's, palliative care notes, acceptance of supplements  Reason for Assessment: Low Braden, wounds  43 y.o. male  Admitting Dx: Endocarditis of aortic valve  ASSESSMENT: 43 year old male with a very complicated past medical history including diabetes, hypertension, morbid obesity, chronic kidney disease, and left below-knee amputation.  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 Infection may be secondary to ulcer on lower extremity. Pt seen by cardiology and deemed not operative candidate.  - Palliative care consult pending as pt has poor prognosis. - Pt receiving HD. - Spoke with wife who said that pt has not been eating solid food for 5 weeks. He has only been drinking Glucerna Shakes. He had three yesterday.  - Pt has lost >100 lbs since February 2015.   Labs: CBGs: 156-208 K and BUN elevated Mg WNL  Height: Ht Readings from Last 1 Encounters:  08/11/14  (2.057 m)    Weight: Wt Readings from Last 1 Encounters:  08/13/14 387 lb 1.6 oz (175.587 kg)    Ideal Body Weight: 217 lbs  % Ideal Body Weight: 167%  Wt Readings from Last 10 Encounters:  08/13/14 387 lb 1.6 oz (175.587 kg)  01/26/14 414  lb 7.4 oz (188 kg)  01/11/14 490 lb (222.263 kg)    Usual Body Weight: 490 lbs 01/11/14  % Usual Body Weight: 79%  BMI:  Body mass index is 41.5 kg/(m^2).  Estimated Nutritional Needs: Kcal: 2450-2700 Protein: 130-140 g Fluid: Per MD  Skin: incision on sacrum, wound on right leg  Diet Order: Renal  EDUCATION NEEDS: -Education needs addressed   Intake/Output Summary (Last 24 hours) at 08/13/14 1119 Last data filed at 08/13/14 1009  Gross per 24 hour  Intake   1030 ml  Output      0 ml  Net   1030 ml    Last BM: prior to admission   Labs:   Recent Labs Lab 08/11/14 2100 08/12/14 1205  NA 140 142  K 5.3 5.5*  CL 101 101  CO2 23 23  BUN 79* 83*  CREATININE 5.51* 6.26*  CALCIUM 8.1* 8.1*  MG 2.1 2.1  PHOS 5.1*  --   GLUCOSE 176* 211*    CBG (last 3)   Recent Labs  08/12/14 1646 08/12/14 1953 08/13/14 0746  GLUCAP 235* 208* 156*    Scheduled Meds: . ARIPiprazole  5 mg Oral Daily  . DAPTOmycin (CUBICIN)  IV  1,000 mg Intravenous Q48H  . feeding supplement (NEPRO CARB STEADY)  237 mL Oral TID BM  . feeding supplement (PRO-STAT SUGAR FREE 64)  30 mL Oral BID  . insulin aspart  0-5 Units Subcutaneous QHS  . insulin aspart  0-9 Units Subcutaneous TID WC  . insulin detemir  15 Units  Subcutaneous QHS  . linezolid  600 mg Intravenous Q12H  . LORazepam  1 mg Intravenous Once  . rifampin (RIFADIN) IVPB  300 mg Intravenous 3 times per day  . sodium chloride  3 mL Intravenous Q12H    Continuous Infusions:   Past Medical History  Diagnosis Date  . Normocytic anemia   . Diabetes mellitus with complication   . GERD (gastroesophageal reflux disease)   . Hypertension   . Venous stasis ulcers     a. a. 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 RLE with recurrent cellulitis.  . Morbid obesity   . CKD (chronic kidney disease), stage III   . Gout   . Sepsis     a. 06/2014 MRSA Sepsis->4 wks of Vanc;  b. 06/2014 Echo: EF 55-60%, mild conc LVH, mod dil LA/RA, mild Ao sclerosis w/o  stenosis, no veg/mass.  . Peripheral neuropathy   . Depression   . Cataract   . Seasonal allergies   . Abdominal abscess     a. s/p prior drainage.  Marland Kitchen DVT (deep venous thrombosis)     a. On Eliquis, s/p IVC filter.    Past Surgical History  Procedure Laterality Date  . Below knee leg amputation Left     2013  . Right rotator cuff surgery x 2    . Right hand surgery      Ebbie Latus RD, LDN

## 2014-08-13 NOTE — Consult Note (Addendum)
WOC wound consult note Reason for Consult: Consult requested for right leg wound. It is difficult to reduce pressure to the affected area to promote healing since pt states he cannot move his leg. X-ray results pending to RLE. If osteomyelitis is noted, then an ortho consult would be required. Wound type: Chronic full thickness stasis ulcer to right posterior leg Measurement: .  1X1X.5cm  Wound bed: 80% yellow fibrinous tissue, 20% red.   Drainage (amount, consistency, odor) Large amt green drainage, some odor.  Periwound: Dry scaly skin surrounding to right leg and foot; appearance consistent with venous stasis changes. Dressing procedure/placement/frequency: Eucerin to assist with removal of dry scaly skin to right leg and foot. Aquacel to absorb drainage and provide antimicrobial benefits to right leg wound. Discussed plan of care with patient and family members at bedside and they verbalize understanding. Please re-consult if further assistance is needed.  Thank-you,  Cammie Mcgee MSN, RN, CWOCN, [place], CNS [phone]

## 2014-08-13 NOTE — Progress Notes (Signed)
INFECTIOUS DISEASE PROGRESS NOTE  ID: Angel Mays is a 43 y.o. male with  Principal Problem:   Endocarditis of aortic valve Active Problems:   Acute encephalopathy   CKD (chronic kidney disease)   Leg ulcer   Diabetes mellitus   HTN (hypertension)   History of DVT (deep vein thrombosis)   Endocarditis   Infectious aortic insufficiency   Bacteremia  Subjective: "there's nothing wrong with my memory"  Abtx:  Anti-infectives   Start     Dose/Rate Route Frequency Ordered Stop   08/12/14 2300  DAPTOmycin (CUBICIN) 1,000 mg in sodium chloride 0.9 % IVPB     1,000 mg 240 mL/hr over 30 Minutes Intravenous Every 48 hours 08/11/14 2038     08/12/14 1515  rifampin (RIFADIN) 300 mg in sodium chloride 0.9 % 100 mL IVPB     300 mg 200 mL/hr over 30 Minutes Intravenous 3 times per day 08/12/14 1449     08/11/14 2200  linezolid (ZYVOX) IVPB 600 mg     600 mg 300 mL/hr over 60 Minutes Intravenous Every 12 hours 08/11/14 1834        Medications:  Scheduled: . ARIPiprazole  5 mg Oral Daily  . DAPTOmycin (CUBICIN)  IV  1,000 mg Intravenous Q48H  . feeding supplement (NEPRO CARB STEADY)  237 mL Oral TID BM  . feeding supplement (PRO-STAT SUGAR FREE 64)  30 mL Oral BID  . insulin aspart  0-5 Units Subcutaneous QHS  . insulin aspart  0-9 Units Subcutaneous TID WC  . insulin detemir  15 Units Subcutaneous QHS  . linezolid  600 mg Intravenous Q12H  . rifampin (RIFADIN) IVPB  300 mg Intravenous 3 times per day  . sodium chloride  3 mL Intravenous Q12H    Objective: Vital signs in last 24 hours: Temp:  [98.3 F (36.8 C)-98.6 F (37 C)] 98.4 F (36.9 C) (09/25 0502) Pulse Rate:  [92] 92 (09/25 0502) Resp:  [18] 18 (09/25 0502) BP: (131-137)/(53-57) 131/56 mmHg (09/25 0502) SpO2:  [92 %-94 %] 94 % (09/25 0502) Weight:  [175.587 kg (387 lb 1.6 oz)] 175.587 kg (387 lb 1.6 oz) (09/25 0502)   General appearance: alert, cooperative and no distress Resp: clear to auscultation  bilaterally Cardio: regular rate and rhythm GI: normal findings: bowel sounds normal and soft, non-tender Extremities: RLE crusting, swelling.   Lab Results  Recent Labs  08/11/14 2100 08/11/14 2222 08/12/14 1012 08/12/14 1205  WBC  --  21.5* 22.9*  --   HGB  --  7.1* 7.1*  --   HCT  --  20.7* 21.6*  --   NA 140  --   --  142  K 5.3  --   --  5.5*  CL 101  --   --  101  CO2 23  --   --  23  BUN 79*  --   --  83*  CREATININE 5.51*  --   --  6.26*   Liver Panel  Recent Labs  08/11/14 2100 08/12/14 1205  PROT 6.1 6.3  ALBUMIN 2.0* 2.1*  AST 90* 78*  ALT 44 52  ALKPHOS 129* 126*  BILITOT 1.3* 1.4*   Sedimentation Rate No results found for this basename: ESRSEDRATE,  in the last 72 hours C-Reactive Protein No results found for this basename: CRP,  in the last 72 hours  Microbiology: No results found for this or any previous visit (from the past 240 hour(s)).  Studies/Results: Dg Chest Port 1 [address]  08/12/2014   CLINICAL DATA:  Shortness of breath.  EXAM: PORTABLE CHEST - 1 VIEW  COMPARISON:  August 04, 2014.  FINDINGS: Stable cardiomegaly. No pneumothorax is noted. Right lung is clear. Left perihilar and basilar opacity is noted concerning for edema or possibly pneumonia. No significant pleural effusion is noted. Bony thorax is intact.  IMPRESSION: Left perihilar and basilar opacity is concerning for edema or possibly pneumonia. Followup radiographs are recommended until resolution.   Electronically Signed   By: Roque Lias M.D.   On: 08/12/2014 08:36     Assessment/Plan: Endocarditis of Ao  MRSA bacteremia  ESRD  RLE infection  Diabetic Ulcer  Diabetes mellitus 2  Hx of C diff (02-2014)  Total days of antibiotics: cubicin/zyvox/rifampin  Per wife, still concerns about memory. Would check MRI Will ask for WOC eval for his foot. No problems with rifampin so far.  [facility]           Johny Sax Infectious Diseases (pager) [phone] [email or web address] 08/13/2014, 8:26 AM  LOS: 2 days

## 2014-08-13 NOTE — Progress Notes (Signed)
CRITICAL VALUE ALERT  Critical value received:  hgb 6.8  Date of notification:  08-13-14  Time of notification:  1700  Critical value read back: ues  Nurse who received alert:  Kandis Ban, RN  MD notified (1st page):  Sabra Heck, MD  Time of first page:  1703  MD notified (2nd page):N/A  Time of second page:N/A  Responding MD: Sabra Heck, MD  Time MD responded: [phone]

## 2014-08-13 NOTE — Progress Notes (Signed)
[facility] PROGRESS NOTE  Wong Steadham ZOX:[id number] DOB: 1971/03/04 DOA: 08/11/2014 PCP: Abner Greenspan, MD  Assessment/Plan: #1 aortic valvular endocarditis/aortic valvular insufficiency Questionable etiology. Infection may be secondary to ulcer on the lower extremity which may have seeded. Patient has been seen by cardiology and cardiothoracic surgery and patient deemed not operative candidate. MRI head pending as per wife patient with memory issues. Continue empiric IV Cubicin and IV Zyvox and rifampin for now. CVTS, ID, cardiology following and appreciate input and recommendations. Per cardiology and cardiothoracic surgery patient has a poor prognosis. Palliative care consult pending for goals of care. Will also have palliative care consult for goals of care.  #2 MRSA bacteremia Repeat Blood cx with GPC in clusters. Continue empiric IV Cubicin and IV Zyvox, and Rifampin. ID following and appreciate input and recommendations.  #3 right lower extremity wound/infection Continue current IV antibiotics. Consult with wound care. X-rays of the right lower extremity pending.  #4 well conreolled DM2 CBGs ranged from 156-208. Continue home regimen of long-acting insulin. Sliding scale insulin.  #5 chronic kidney disease stage IV/5 Per wife patient has been temporarily on hemodialysis while in the hospital at [facility]. Patient with poor urinary output. Patient has been seen by renal and patient for HD today.  #6 anemia The admission H&P patient is status post 5 units packed red blood cells at [facility]. Follow H&H. Transfusion threshhold Hgb <7.   #7 history of DVT Patient was on Elliquis, however this was felt secondary to anemia and chronic kidney disease. Patient is status post IVC filter. ?? Infected, remove IVC, defer to ID.  #8 prophylaxis PPI for GI prophylaxis. SCDs for DVT prophylaxis.  #9 prognosis Patient with a very poor prognosis. Patient is presenting with aortic valvular  endocarditis, MRSA bacteremia, chronic kidney disease stage IV/5, right lower extremity wound infection, type II diabetic and also noted to be anemic. Patient deemed not a surgical candidate. Palliative care for goals of care pending.  Code Status: Full Family Communication: Updated patient and wife and children at bedside. Disposition Plan: Home versus SNF when medically stable.   Consultants:  ID: Dr. Ninetta Lights 08/12/2014  Nephrology: Dr.Powell 08/12/2014  Cardiothoracic surgery Dr. Dorris Fetch 08/11/2014  Cardiology: Dr. Rennis Golden 08/11/2014  Procedures:  Chest x-ray 08/12/2014  TEE 08/11/2014  Antibiotics: Total days of antibiotics day 8  IV daptomycin 08/12/2014  IV Zyvox 08/11/2014  Oral rifampin 08/12/2014  HPI/Subjective: Patient denies any chest pain. Patient states he is just tired. Patient denies any shortness of breath. Patient anxious about MRI.  Objective: Filed Vitals:   08/13/14 0502  BP: 131/56  Pulse: 92  Temp: 98.4 F (36.9 C)  Resp: 18    Intake/Output Summary (Last 24 hours) at 08/13/14 1047 Last data filed at 08/13/14 1009  Gross per 24 hour  Intake   1030 ml  Output      0 ml  Net   1030 ml   Filed Weights   08/11/14 1700 08/13/14 0502  Weight: 170.552 kg (376 lb) 175.587 kg (387 lb 1.6 oz)    Exam:   General:  NAD  Cardiovascular: RRR  Respiratory: CTAB  Abdomen: Soft, nontender, nondistended, positive bowel sounds.  Musculoskeletal: No clubbing cyanosis or edema. Right leg with chronic venous stasis changes. Right lower extremity with bandage.  Data Reviewed: Basic Metabolic Panel:  Recent Labs Lab 08/11/14 2100 08/12/14 1205  NA 140 142  K 5.3 5.5*  CL 101 101  CO2 23 23  GLUCOSE 176* 211*  BUN 79* 83*  CREATININE 5.51* 6.26*  CALCIUM 8.1* 8.1*  MG 2.1 2.1  PHOS 5.1*  --    Liver Function Tests:  Recent Labs Lab 08/11/14 2100 08/12/14 1205  AST 90* 78*  ALT 44 52  ALKPHOS 129* 126*  BILITOT 1.3* 1.4*   PROT 6.1 6.3  ALBUMIN 2.0* 2.1*   No results found for this basename: LIPASE, AMYLASE,  in the last 168 hours No results found for this basename: AMMONIA,  in the last 168 hours CBC:  Recent Labs Lab 08/11/14 2222 08/12/14 1012  WBC 21.5* 22.9*  NEUTROABS 18.5* 20.3*  HGB 7.1* 7.1*  HCT 20.7* 21.6*  MCV 83.5 84.7  PLT 223 236   Cardiac Enzymes:  Recent Labs Lab 08/12/14 1649  CKTOTAL 27   BNP (last 3 results) No results found for this basename: PROBNP,  in the last 8760 hours CBG:  Recent Labs Lab 08/12/14 1014 08/12/14 1228 08/12/14 1646 08/12/14 1953 08/13/14 0746  GLUCAP 213* 204* 235* 208* 156*    Recent Results (from the past 240 hour(s))  CULTURE, BLOOD (ROUTINE X 2)     Status: None   Collection Time    08/11/14  8:43 PM      Result Value Ref Range Status   Specimen Description BLOOD LEFT HAND   Final   Special Requests BOTTLES DRAWN AEROBIC AND ANAEROBIC 10CC EACH   Final   Culture  Setup Time     Final   Value: 08/12/2014 03:41     Performed at [facility]   Culture     Final   Value:        BLOOD CULTURE RECEIVED NO GROWTH TO DATE CULTURE WILL BE HELD FOR 5 DAYS BEFORE ISSUING A FINAL NEGATIVE REPORT     Note: Culture results may be compromised due to an excessive volume of blood received in culture bottles.     Performed at [facility]   Report Status PENDING   Incomplete  CULTURE, BLOOD (ROUTINE X 2)     Status: None   Collection Time    08/11/14  9:00 PM      Result Value Ref Range Status   Specimen Description BLOOD LEFT HAND   Final   Special Requests BOTTLES DRAWN AEROBIC ONLY 5CC   Final   Culture  Setup Time     Final   Value: 08/12/2014 03:38     Performed at [facility]   Culture     Final   Value: GRAM POSITIVE COCCI IN CLUSTERS     Note: Gram Stain Report Called to,Read Back By and Verified WithCandace Cruise @ 1038 ON [id number] BY [facility]     Performed at [facility]   Report Status PENDING    Incomplete     Studies: Dg Chest Port 1 View  08/12/2014   CLINICAL DATA:  Shortness of breath.  EXAM: PORTABLE CHEST - 1 VIEW  COMPARISON:  August 04, 2014.  FINDINGS: Stable cardiomegaly. No pneumothorax is noted. Right lung is clear. Left perihilar and basilar opacity is noted concerning for edema or possibly pneumonia. No significant pleural effusion is noted. Bony thorax is intact.  IMPRESSION: Left perihilar and basilar opacity is concerning for edema or possibly pneumonia. Followup radiographs are recommended until resolution.   Electronically Signed   By: Roque Lias M.D.   On: 08/12/2014 08:36    Scheduled Meds: . ARIPiprazole  5 mg Oral Daily  . DAPTOmycin (CUBICIN)  IV  1,000  mg Intravenous Q48H  . feeding supplement (NEPRO CARB STEADY)  237 mL Oral TID BM  . feeding supplement (PRO-STAT SUGAR FREE 64)  30 mL Oral BID  . insulin aspart  0-5 Units Subcutaneous QHS  . insulin aspart  0-9 Units Subcutaneous TID WC  . insulin detemir  15 Units Subcutaneous QHS  . linezolid  600 mg Intravenous Q12H  . LORazepam  1 mg Intravenous Once  . rifampin (RIFADIN) IVPB  300 mg Intravenous 3 times per day  . sodium chloride  3 mL Intravenous Q12H   Continuous Infusions:   Principal Problem:   Endocarditis of aortic valve Active Problems:   Infectious aortic insufficiency   Acute encephalopathy   CKD (chronic kidney disease)   Leg ulcer   Diabetes mellitus   HTN (hypertension)   History of DVT (deep vein thrombosis)   Endocarditis   Bacteremia    Time spent: 35 minutes    [facility] MD [facility] Pager [phone]. If 7PM-7AM, please contact night-coverage at [email or web address], password [facility] 08/13/2014, 10:47 AM  LOS: 2 days

## 2014-08-13 NOTE — Progress Notes (Signed)
CRITICAL VALUE ALERT  Critical value received:  Positive Blood cultures-Aerobic drawn on 08/11/2014, gram + cocci in clusters (consistent with values reported earlier today; MD aware)  Date of notification:  08/13/2014  Time of notification:  19:38  Critical value read back:Yes.    Nurse who received alert:  Candace Cruise, RN  MD notified (1st page):    Time of first page:    MD notified (2nd page):  Time of second page:  Responding MD:    Time MD responded:

## 2014-08-13 NOTE — Consult Note (Signed)
Patient Angel Mays      DOB: 19-Aug-1971      YNW:[id number]     Consult Note from the Palliative Medicine Team at [facility]    Consult Requested by: Dr Janee Morn     PCP: Abner Greenspan, MD Reason for Consultation:Clarification of GOC and options     Phone Number:[phone]  Assessment of patients Current state: Continued physical, functional and cognitive decline over the past three years 2/2 to multiple co-morbidities.  He is facing his own mortality and the realization of the limitations of medicine  Patient and family faced with advanced directive decisions and anticipatory care needs.  Consult is for review of medical treatment options, clarification of goals of care and end of life issues, disposition and options, and symptom recommendation.  This NP Lorinda Creed reviewed medical records, received report from team, assessed the patient and then meet at the patient's bedside along with his wife Jahmere Bramel and daughter Lowell Guitar to discuss diagnosis prognosis, GOC, EOL wishes disposition and options.  A detailed discussion was had today regarding advanced directives.  Concepts specific to code status, artifical feeding and hydration, continued IV antibiotics and rehospitalization was had.  The difference between a aggressive medical intervention path  and a palliative comfort care path for this patient at this time was had.  Values and goals of care important to patient and family were attempted to be elicited.  Concept of Hospice and Palliative Care were discussed  Natural trajectory and expectations at EOL were discussed.  Questions and concerns addressed.  Hard Choices booklet left for review. Family encouraged to call with questions or concerns.  PMT will continue to support holistically.   Goals of Care: 1.  Code Status:  Full code   2. Scope of Treatment: Patient and family are open to all available and offered medical interventions to prolong life at this time.  All  understand the overall poor prognosis and limited treatment options  3. Disposition:   Dependant on outcomes.  We did have discussion around possibility of "going [place] with hospice", [place]  They are watchful waiting thru the weekend and will continue today's discussion with this NP on Monday afternoon.   4. Symptom Management:   1. Weakness: continued medical management of chronic disease 2. Pain: Oxycodone 10 mg po every 12 hrs prn  5. Psychosocial: Emotional support offered to patient and his family.  6. Spiritual: Outside church support    Brief HPI:   43 year old male with a very complicated past medical history including diabetes, hypertension, morbid obesity, chronic kidney disease, and left below-knee amputation,  Patient has been in and out of hospitals since 2013. Not ambulatory at baseline. In August, he has a staph aureus bacteremia that they believe is associated with a right lower extremity infection. He was treated there with 4 weeks of IV vancomycin. A transthoracic echo done at that time was negative for endocarditis. A transesophageal echocardiogram was never done. Patient supposedly cleared his blood cultures.  Patient presents back to the hospital with positive blood cultures in September. He was also found to have acute on chronic renal failure with a white blood cell count 26,000. Patient was also found to be anemic at 6.1. At [facility] they thought likely source was either a recurrent infection of his right lateral calf wound. Patient also has an IVC filter placed at some point for DVT. 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 Patient was treated with daptomycin as well lizazolid. On 9/23. He had a TEE done by cardiology at [facility] that showed a large vegetation noted on the aortic valve with likely multi-leaflet vegetation. Was estimated at 1 cm in size with 2 or more seen  at that time. It was extending into the 8 aortic root with systole back and to the LVOT in diastole. There was significant aortic valve regurgitation that was estimated at moderate to severe.  Poor long term dialysis candidate.   Overall poor prognosis.      ROS:  Weakness, confusion   PMH:  Past Medical History  Diagnosis Date  . Normocytic anemia   . Diabetes mellitus with complication   . GERD (gastroesophageal reflux disease)   . Hypertension   . Venous stasis ulcers     a. a. 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 RLE with recurrent cellulitis.  . Morbid obesity   . CKD (chronic kidney disease), stage III   . Gout   . Sepsis     a. 06/2014 MRSA Sepsis->4 wks of Vanc;  b. 06/2014 Echo: EF 55-60%, mild conc LVH, mod dil LA/RA, mild Ao sclerosis w/o stenosis, no veg/mass.  . Peripheral neuropathy   . Depression   . Cataract   . Seasonal allergies   . Abdominal abscess     a. s/p prior drainage.  Marland Kitchen DVT (deep venous thrombosis)     a. On Eliquis, s/p IVC filter.     PSH: Past Surgical History  Procedure Laterality Date  . Below knee leg amputation Left     2013  . Right rotator cuff surgery x 2    . Right hand surgery     I have reviewed the FH and SH and  If appropriate update it with new information. Allergies  Allergen Reactions  . Omnipaque [Iohexol]    Scheduled Meds: . ARIPiprazole  5 mg Oral Daily  . DAPTOmycin (CUBICIN)  IV  1,000 mg Intravenous Q48H  . feeding supplement (GLUCERNA SHAKE)  237 mL Oral TID BM  . feeding supplement (PRO-STAT SUGAR FREE 64)  30 mL Oral BID  . insulin aspart  0-5 Units Subcutaneous QHS  . insulin aspart  0-9 Units Subcutaneous TID WC  . insulin detemir  15 Units Subcutaneous QHS  . linezolid  600 mg Intravenous Q12H  . LORazepam  1 mg Intravenous Once  . rifampin (RIFADIN) IVPB  300 mg Intravenous 3 times per day  . sodium chloride  3 mL Intravenous Q12H   Continuous Infusions:  PRN Meds:.acetaminophen, albuterol, LORazepam, ondansetron  (ZOFRAN) IV, oxyCODONE    BP 131/56  Pulse 92  Temp(Src) 98.4 F (36.9 C) (Oral)  Resp 18  Ht  (2.057 m)  Wt 175.587 kg (387 lb 1.6 oz)  BMI 41.50 kg/m2  SpO2 94%   PPS:30 % at best   Intake/Output Summary (Last 24 hours) at 08/13/14 1251 Last data filed at 08/13/14 1009  Gross per 24 hour  Intake    730 ml  Output      0 ml  Net    730 ml     Physical Exam:  General: ill appearing,  HEENT:  Dry buccal membranes Chest:   Decreased in bases, distant CVS: RRR Abdomen: obese, soft NT Ext: L bka, right LE with chronic stasis changes Neuro:  Lethargic but oriented to person and place  Labs: CBC    Component Value Date/Time   WBC 22.9* 08/12/2014 1012  RBC 2.55* 08/12/2014 1012   RBC 2.70* 01/22/2014 2130   HGB 7.1* 08/12/2014 1012   HCT 21.6* 08/12/2014 1012   PLT 236 08/12/2014 1012   MCV 84.7 08/12/2014 1012   MCH 27.8 08/12/2014 1012   MCHC 32.9 08/12/2014 1012   RDW 17.5* 08/12/2014 1012   LYMPHSABS 1.3 08/12/2014 1012   MONOABS 1.2* 08/12/2014 1012   EOSABS 0.1 08/12/2014 1012   BASOSABS 0.0 08/12/2014 1012    BMET    Component Value Date/Time   NA 142 08/12/2014 1205   K 5.5* 08/12/2014 1205   CL 101 08/12/2014 1205   CO2 23 08/12/2014 1205   GLUCOSE 211* 08/12/2014 1205   BUN 83* 08/12/2014 1205   CREATININE 6.26* 08/12/2014 1205   CALCIUM 8.1* 08/12/2014 1205   CALCIUM 8.6 02/03/2014 1915   GFRNONAA 10* 08/12/2014 1205   GFRAA 11* 08/12/2014 1205    CMP     Component Value Date/Time   NA 142 08/12/2014 1205   K 5.5* 08/12/2014 1205   CL 101 08/12/2014 1205   CO2 23 08/12/2014 1205   GLUCOSE 211* 08/12/2014 1205   BUN 83* 08/12/2014 1205   CREATININE 6.26* 08/12/2014 1205   CALCIUM 8.1* 08/12/2014 1205   CALCIUM 8.6 02/03/2014 1915   PROT 6.3 08/12/2014 1205   ALBUMIN 2.1* 08/12/2014 1205   AST 78* 08/12/2014 1205   ALT 52 08/12/2014 1205   ALKPHOS 126* 08/12/2014 1205   BILITOT 1.4* 08/12/2014 1205   GFRNONAA 10* 08/12/2014 1205   GFRAA 11* 08/12/2014 1205       Time In Time Out Total Time Spent with Patient Total Overall Time  1200 1330 80 min 90 min    Greater than 50%  of this time was spent counseling and coordinating care related to the above assessment and plan.   Lorinda Creed NP  Palliative Medicine Team Team Phone # [phone] Pager [phone]  Discussed with Dr Janee Morn

## 2014-08-13 NOTE — Progress Notes (Signed)
Pt refusing MRI and Xrays.  States he wants to be "heavily sedated" for these exams.  C/o claustrophobia.

## 2014-08-13 NOTE — Progress Notes (Signed)
I saw the patient and agree with the above assessment and plan.    Pt w/ life threatening endovascular infection and not an operative candidate.  Aslo with concern for actuve RLE soft tissue infection.  He has anuric AoCKD and HD commenced at [facility] before transfer.  Will cont HD while treatment options are clarified; long term it will be difficult to provide outpt HD should he need it.  Will check complement levels today to see if GN is possible, but won't change management.  HD today and tomorrow through femoral TDC and then remove for catheter holiday while on Dapto, Rifampin, Linezolid.

## 2014-08-13 NOTE — Progress Notes (Signed)
Subjective: Patient seen at bedside this AM. Patient sitting up in bed, alert, says he is tired, has no energy.  No urine output overnight. Afebrile, BP stable.  For HD today.   Objective:  Scheduled Meds: . ARIPiprazole  5 mg Oral Daily  . DAPTOmycin (CUBICIN)  IV  1,000 mg Intravenous Q48H  . feeding supplement (NEPRO CARB STEADY)  237 mL Oral TID BM  . feeding supplement (PRO-STAT SUGAR FREE 64)  30 mL Oral BID  . insulin aspart  0-5 Units Subcutaneous QHS  . insulin aspart  0-9 Units Subcutaneous TID WC  . insulin detemir  15 Units Subcutaneous QHS  . linezolid  600 mg Intravenous Q12H  . rifampin (RIFADIN) IVPB  300 mg Intravenous 3 times per day  . sodium chloride  3 mL Intravenous Q12H   Continuous Infusions:  PRN Meds:.acetaminophen, albuterol, ondansetron (ZOFRAN) IV, oxyCODONE  BP 131/56  Pulse 92  Temp(Src) 98.4 F (36.9 C) (Oral)  Resp 18  Ht  (2.057 m)  Wt 387 lb 1.6 oz (175.587 kg)  BMI 41.50 kg/m2  SpO2 94%   Intake/Output Summary (Last 24 hours) at 08/13/14 1610 Last data filed at 08/12/14 1726  Gross per 24 hour  Intake    400 ml  Output      0 ml  Net    400 ml    Weight change: 11 lb 1.6 oz (5.035 kg)  EXAM:  General: Morbidly obese male, alert, cooperative, NAD. Chronically ill-appearing. Appears tachypneic.  HEENT: PERRL, EOMI. Moist mucus membranes. Slight facial asymmetry.  Neck: Full range of motion without pain, supple, no lymphadenopathy or carotid bruits Lungs: Mild tachypnea. Air entry equal bilaterally, rales at bases. No wheezes.  Heart: Regular rate and rhythm, 3/6 combined systolic/diastolic murmur heard, loudest at left upper sternal border. No rubs or gallops.  Abdomen: Obese, tender in LUQ and suprapubic region. BS present. Left femoral HD catheter present.  Extremities: RLE w/ severe swelling, erythema, and venous stasis changes. Foot w/ large overlying scab/chronic skin changes on dorsum of the foot. Leg w/ large overlying  eschar on medial surface. Underlying skin erythematous, edematous. LLE w/ BKA.  Neurologic: Alert & oriented x3, cranial nerves grossly intact, strength grossly intact. Depressed mood, flat affect.    Labs: Basic Metabolic Panel:  Recent Labs Lab 08/11/14 2100 08/12/14 1205  NA 140 142  K 5.3 5.5*  CL 101 101  CO2 23 23  GLUCOSE 176* 211*  BUN 79* 83*  CREATININE 5.51* 6.26*  CALCIUM 8.1* 8.1*  MG 2.1 2.1  PHOS 5.1*  --     Liver Function Tests:  Recent Labs Lab 08/11/14 2100 08/12/14 1205  AST 90* 78*  ALT 44 52  ALKPHOS 129* 126*  BILITOT 1.3* 1.4*  PROT 6.1 6.3  ALBUMIN 2.0* 2.1*    CBC:  Recent Labs Lab 08/11/14 2222 08/12/14 1012  WBC 21.5* 22.9*  NEUTROABS 18.5* 20.3*  HGB 7.1* 7.1*  HCT 20.7* 21.6*  MCV 83.5 84.7  PLT 223 236    Cardiac Enzymes:  Recent Labs Lab 08/12/14 1649  CKTOTAL 27    CBG:  Recent Labs Lab 08/12/14 0841 08/12/14 1014 08/12/14 1228 08/12/14 1646 08/12/14 1953  GLUCAP 196* 213* 204* 235* 208*    ABG    Component Value Date/Time   PHART 7.116* 01/22/2014 0255   PCO2ART 32.5* 01/22/2014 0255   PO2ART 103.0* 01/22/2014 0255   HCO3 10.5* 01/22/2014 0255   TCO2 11 01/22/2014 0255   ACIDBASEDEF  18.0* 01/22/2014 0255   O2SAT 95.0 01/22/2014 0255     Studies/Results: Dg Chest Port 1 View  08/12/2014   CLINICAL DATA:  Shortness of breath.  EXAM: PORTABLE CHEST - 1 VIEW  COMPARISON:  August 04, 2014.  FINDINGS: Stable cardiomegaly. No pneumothorax is noted. Right lung is clear. Left perihilar and basilar opacity is noted concerning for edema or possibly pneumonia. No significant pleural effusion is noted. Bony thorax is intact.  IMPRESSION: Left perihilar and basilar opacity is concerning for edema or possibly pneumonia. Followup radiographs are recommended until resolution.   Electronically Signed   By: Roque Lias M.D.   On: 08/12/2014 08:36   Assessment/Plan: 43 y/o M w/ multiple co-morbidities, most significant for  LE chronic venous stasis changes and RLE leg infection, RLE BKA, DM type II, CKD stage 5, transferred from [facility] w/ MRSA bacteremia, found to have large aortic vegetation on TEE. Also w/ worsening renal failure requiring HD via temporary left femoral HD catheter.   CKD stage 5- Patient w/ known history of chronic kidney disease 2/2 poorly controlled DM, and acute worsening most likely d/t severe infection. Baseline Cr relatively unclear, but has previously been ~2.5 earlier this year. Very complex medical issues including IE, severe right LE cellulitis, and MRSA bacteremia. Patient w/ no urine output overnight. For HD today via left femoral HD catheter. Will also receive HD tomorrow and will remove catheter after, likely place new one on Monday or Tuesday.  Hyperkalemia- 5.5 on admission. Labs pending today.  MRSA bacteremia- Had previously been treated for MRSA bacteremia in 06/2014 w/ 4 week course of Vancomycin. Recent blood cultures from 08/08/14 still confirm presence of MRSA. ID consulted, currently on Linezolid, Daptomycin, and Rifampin.  Aortic Valve Infective Endocarditis- TEE at [facility] from 08/11/14 shows large 1 cm vegetation on the aortic valve. Seen by CVTS, feel that patient is most definitely not a candidate for valve replacement.  RLE cellulitis/likely osteomyelitis- Antibiotics as above. For further imaging of the leg today. H/o DVT- On Eliquis at home, also w/ IVC filter, likely infected 2/2 bacteremia as well. Possible to remove?  Normocytic Anemia- Baseline Hb 9. 7.1 on admission. Continue to monitor.   Lars Masson PGY-2, Internal Medicine  Pager: [phone]

## 2014-08-14 DIAGNOSIS — R531 Weakness: Secondary | ICD-10-CM

## 2014-08-14 DIAGNOSIS — Z7189 Other specified counseling: Secondary | ICD-10-CM

## 2014-08-14 DIAGNOSIS — Z515 Encounter for palliative care: Secondary | ICD-10-CM

## 2014-08-14 LAB — TYPE AND SCREEN
ABO/RH(D): AB POS
ANTIBODY SCREEN: NEGATIVE
Unit division: 0
Unit division: 0

## 2014-08-14 LAB — GLUCOSE, CAPILLARY
Glucose-Capillary: 129 mg/dL — ABNORMAL HIGH (ref 70–99)
Glucose-Capillary: 104 mg/dL — ABNORMAL HIGH (ref 70–99)
Glucose-Capillary: 152 mg/dL — ABNORMAL HIGH (ref 70–99)

## 2014-08-14 LAB — C4 COMPLEMENT: Complement C4, Body Fluid: 21 mg/dL (ref 10–40)

## 2014-08-14 LAB — RENAL FUNCTION PANEL
Albumin: 2.1 g/dL — ABNORMAL LOW (ref 3.5–5.2)
Anion gap: 18 — ABNORMAL HIGH (ref 5–15)
BUN: 63 mg/dL — ABNORMAL HIGH (ref 6–23)
CALCIUM: 7.9 mg/dL — AB (ref 8.4–10.5)
CO2: 23 meq/L (ref 19–32)
CREATININE: 5.25 mg/dL — AB (ref 0.50–1.35)
Chloride: 97 mEq/L (ref 96–112)
GFR calc Af Amer: 14 mL/min — ABNORMAL LOW (ref >90)
GFR, EST NON AFRICAN AMERICAN: 12 mL/min — AB (ref >90)
Glucose, Bld: 106 mg/dL — ABNORMAL HIGH (ref 70–99)
Phosphorus: 5.4 mg/dL — ABNORMAL HIGH (ref 2.3–4.6)
Potassium: 4.7 mEq/L (ref 3.7–5.3)
Sodium: 138 mEq/L (ref 137–147)

## 2014-08-14 LAB — C3 COMPLEMENT: C3 COMPLEMENT: 87 mg/dL — AB (ref 90–180)

## 2014-08-14 LAB — CBC
HEMATOCRIT: 24.3 % — AB (ref 39.0–52.0)
Hemoglobin: 8.2 g/dL — ABNORMAL LOW (ref 13.0–17.0)
MCH: 29.4 pg (ref 26.0–34.0)
MCHC: 33.7 g/dL (ref 30.0–36.0)
MCV: 87.1 fL (ref 78.0–100.0)
PLATELETS: 206 10*3/uL (ref 150–400)
RBC: 2.79 MIL/uL — AB (ref 4.22–5.81)
RDW: 17.3 % — AB (ref 11.5–15.5)
WBC: 18 10*3/uL — AB (ref 4.0–10.5)

## 2014-08-14 MED ORDER — LIDOCAINE-PRILOCAINE 2.5-2.5 % EX CREA: 1.0000 "application " | TOPICAL_CREAM | CUTANEOUS | Status: DC | PRN

## 2014-08-14 MED ORDER — NEPRO/CARBSTEADY PO LIQD: 237.0000 mL | ORAL | Status: DC | PRN

## 2014-08-14 MED ORDER — ALTEPLASE 2 MG IJ SOLR: 2.0000 mg | Freq: Once | INTRAMUSCULAR | Status: DC | PRN

## 2014-08-14 MED ORDER — HEPARIN SODIUM (PORCINE) 1000 UNIT/ML DIALYSIS: 1000.0000 [IU] | INTRAMUSCULAR | Status: DC | PRN

## 2014-08-14 MED ORDER — PENTAFLUOROPROP-TETRAFLUOROETH EX AERO: 1.0000 "application " | INHALATION_SPRAY | CUTANEOUS | Status: DC | PRN

## 2014-08-14 MED ORDER — HEPARIN SODIUM (PORCINE) 1000 UNIT/ML DIALYSIS
20.0000 [IU]/kg | INTRAMUSCULAR | Status: DC | PRN
  Administered 2014-08-14: 3400 [IU] via INTRAVENOUS_CENTRAL

## 2014-08-14 MED ORDER — LIDOCAINE HCL (PF) 1 % IJ SOLN: 5.0000 mL | INTRAMUSCULAR | Status: DC | PRN

## 2014-08-14 MED ORDER — SODIUM CHLORIDE 0.9 % IV SOLN: 100.0000 mL | INTRAVENOUS | Status: DC | PRN

## 2014-08-14 NOTE — Progress Notes (Signed)
Admit: 08/11/2014 LOS: 3  57M AoCKD5, now anuric and recent start RRT (9/21 at [facility]) w/ MRSA AV endocarditits, morbid obesity, s/p R BKA, chronic LLE stasis.  Not surgical candidate.  Dapto/Linezolid/Rifampin  Subjective:  HD yesterday, uneventful +BCx from 9/23 No new complaints For HD today  Complement levels WNL RLE Plain films without evidence of OM Hb 6.8 yesterday, transfused 2u PRBC at HD; 8.2 this AM  09/25 0701 - 09/26 0700 In: 2900 [P.O.:1230; Blood:670; IV Piggyback:1000] Out: 1483   Filed Weights   08/13/14 1635 08/13/14 2137 08/14/14 0500  Weight: 178.9 kg (394 lb 6.5 oz) 177.1 kg (390 lb 7 oz) 178 kg (392 lb 6.7 oz)    Scheduled Meds: . sodium chloride   Intravenous Once  . ARIPiprazole  5 mg Oral Daily  . DAPTOmycin (CUBICIN)  IV  1,000 mg Intravenous Q48H  . feeding supplement (GLUCERNA SHAKE)  237 mL Oral TID BM  . feeding supplement (PRO-STAT SUGAR FREE 64)  30 mL Oral BID  . hydrocerin  1 application Topical Daily  . insulin aspart  0-5 Units Subcutaneous QHS  . insulin aspart  0-9 Units Subcutaneous TID WC  . insulin detemir  15 Units Subcutaneous QHS  . linezolid  600 mg Intravenous Q12H  . LORazepam  2 mg Intravenous Once  . rifampin (RIFADIN) IVPB  300 mg Intravenous 3 times per day  . sodium chloride  3 mL Intravenous Q12H   Continuous Infusions:  PRN Meds:.acetaminophen, albuterol, LORazepam, ondansetron (ZOFRAN) IV, oxyCODONE  Current Labs: reviewed    Physical Exam:  Blood pressure 106/38, pulse 96, temperature 98.3 F (36.8 C), temperature source Oral, resp. rate 26, height  (2.057 m), weight 178 kg (392 lb 6.7 oz), SpO2 93.00%. NAD, in bed, obese RRR, nl s1s2, no murmur CTAB ant auscultation Chronic scaling changes of LLE S/nt/nd Nonfocal  Assessment 1. AoCKD5 (BL SCr 3s), now req RRT via temp femoral HD Catheter 2. MRSA AV Endocarditis, 1cm vegetation, not operative candidate on Dapto/Linezolid/Rifampin 3. Morbid obesity, bed  bound 4. Hx/o DVT w/ IVC filter  Plan 1. Plan for HD again today 2. Remove Femoral HD catheter after 3. Tentative plan for replacement Tues 9/29, if needs RRT which is likely 4. Long term HD will be challenging, if needed  Sabra Heck MD 08/14/2014, 8:54 AM   Recent Labs Lab 08/11/14 2100 08/12/14 1205 08/13/14 1635 08/14/14 0555  NA 140 142 135* 138  K 5.3 5.5* 5.4* 4.7  CL 101 101 98 97  CO2 GLUCOSE 176* 211* 156* 106*  BUN 79* 83* 86* 63*  CREATININE 5.51* 6.26* 6.48* 5.25*  CALCIUM 8.1* 8.1* 8.0* 7.9*  PHOS 5.1*  --  5.8* 5.4*    Recent Labs Lab 08/11/14 2222 08/12/14 1012 08/13/14 1635 08/14/14 0555  WBC 21.5* 22.9* 21.4* 18.0*  NEUTROABS 18.5* 20.3* 17.9*  --   HGB 7.1* 7.1* 6.8* 8.2*  HCT 20.7* 21.6* 20.6* 24.3*  MCV 83.5 84.7 84.4 87.1  PLT 223 236 251 206

## 2014-08-14 NOTE — Progress Notes (Signed)
CRITICAL VALUE ALERT  Critical value received:  anaerobic bottle of blood cultures gram + cocci in clusters  Date of notification:  08/14/2014   Time of notification:  5:07 AM   Critical value read back:Yes.    Nurse who received alert:  Jola Schmidt   MD notified (1st page):  Values consistent with previously reported values  Time of first page:    MD notified (2nd page):  Time of second page:  Responding MD:    Time MD responded:

## 2014-08-14 NOTE — Progress Notes (Signed)
[facility] PROGRESS NOTE  Bernabe Dorce ZOX:[id number] DOB: 1971-07-24 DOA: 08/11/2014 PCP: Abner Greenspan, MD  Assessment/Plan: #1 aortic valvular endocarditis/aortic valvular insufficiency Questionable etiology. Infection may be secondary to ulcer on the lower extremity which may have seeded. Patient has been seen by cardiology and cardiothoracic surgery and patient deemed not operative candidate. MRI head pending as per wife patient with memory issues. MRI was attempted yesterday and patient given about 4 mg of IV Ativan however unable to be done. Patient at this point in time is refusing MRI under sedation and a such will cancel MRI. Continue empiric IV Cubicin and IV Zyvox and rifampin for now. CVTS, ID, cardiology following and appreciate input and recommendations. Per cardiology and cardiothoracic surgery patient has a poor prognosis. Palliative care consult pending for goals of care.  #2 MRSA bacteremia Repeat Blood cx with GPC in clusters. Continue empiric IV Cubicin and IV Zyvox, and Rifampin. ID following and appreciate input and recommendations.  #3 right lower extremity wound/infection Continue current IV antibiotics. Patient has been seen by wound care, and recommendations made. X-rays of the right lower extremity negative for osteomyelitis.   #4 well conreolled DM2 CBGs ranged from 111-181. Continue home regimen of long-acting insulin. Sliding scale insulin.  #5 chronic kidney disease stage IV/5 Per wife patient has been temporarily on hemodialysis while in the hospital at [facility] which was started 08/09/2014. Patient with poor urinary output. Patient has been seen by renal and patient for HD today.  #6 anemia The admission H&P patient is status post 5 units packed red blood cells at [place]. Follow H&H. Hemoglobin currently at 8.2. Transfusion threshhold Hgb <7.   #7 history of DVT Patient was on Elliquis, however this was felt secondary to anemia and chronic kidney  disease. Patient is status post IVC filter. ?? Infected, remove IVC, defer to ID.  #8 prophylaxis PPI for GI prophylaxis. SCDs for DVT prophylaxis.  #9 prognosis Patient with a very poor prognosis. Patient is presenting with aortic valvular endocarditis, MRSA bacteremia, chronic kidney disease stage IV/5 currently on HD started 08/09/14 at [facility] right lower extremity wound infection, type II diabetic, morbid obesity, chronic lower extremity venostasis and also noted to be anemic. Patient deemed not a surgical candidate. Palliative care for goals of care pending.  Code Status: Full Family Communication: Updated patient and wife and children at bedside. Disposition Plan: Home versus SNF when medically stable.   Consultants:  ID: Dr. Ninetta Lights 08/12/2014  Nephrology: Dr.Powell 08/12/2014  Cardiothoracic surgery Dr. Dorris Fetch 08/11/2014  Cardiology: Dr. Rennis Golden 08/11/2014  Procedures:  Chest x-ray 08/12/2014  TEE 08/11/2014  X-ray of the right lower extremity 08/13/2014  Antibiotics: Total days of antibiotics day 8  IV daptomycin 08/12/2014  IV Zyvox 08/11/2014  Oral rifampin 08/12/2014  HPI/Subjective: Patient denies any chest pain. Patient states he is just tired. Patient denies any shortness of breath. Patient does not want MRI to be done. Patient in hemodialysis.  Objective: Filed Vitals:   08/14/14 1400  BP: 127/56  Pulse: 90  Temp:   Resp: 28    Intake/Output Summary (Last 24 hours) at 08/14/14 1420 Last data filed at 08/14/14 1028  Gross per 24 hour  Intake   1973 ml  Output   1483 ml  Net    490 ml   Filed Weights   08/13/14 2137 08/14/14 0500 08/14/14 1102  Weight: 177.1 kg (390 lb 7 oz) 178 kg (392 lb 6.7 oz) 178.2 kg (392 lb 13.8 oz)    Exam:  General:  NAD  Cardiovascular: RRR  Respiratory: CTAB  Abdomen: Soft, nontender, nondistended, positive bowel sounds.  Musculoskeletal: No clubbing cyanosis or edema. Right leg with chronic venous  stasis changes. Right lower extremity with bandage.  Data Reviewed: Basic Metabolic Panel:  Recent Labs Lab 08/11/14 2100 08/12/14 1205 08/13/14 1635 08/14/14 0555  NA 140 142 135* 138  K 5.3 5.5* 5.4* 4.7  CL 101 101 98 97  CO2 GLUCOSE 176* 211* 156* 106*  BUN 79* 83* 86* 63*  CREATININE 5.51* 6.26* 6.48* 5.25*  CALCIUM 8.1* 8.1* 8.0* 7.9*  MG 2.1 2.1  --   --   PHOS 5.1*  --  5.8* 5.4*   Liver Function Tests:  Recent Labs Lab 08/11/14 2100 08/12/14 1205 08/13/14 1635 08/14/14 0555  AST 90* 78*  --   --   ALT 44 52  --   --   ALKPHOS 129* 126*  --   --   BILITOT 1.3* 1.4*  --   --   PROT 6.1 6.3  --   --   ALBUMIN 2.0* 2.1* 2.1* 2.1*   No results found for this basename: LIPASE, AMYLASE,  in the last 168 hours No results found for this basename: AMMONIA,  in the last 168 hours CBC:  Recent Labs Lab 08/11/14 2222 08/12/14 1012 08/13/14 1635 08/14/14 0555  WBC 21.5* 22.9* 21.4* 18.0*  NEUTROABS 18.5* 20.3* 17.9*  --   HGB 7.1* 7.1* 6.8* 8.2*  HCT 20.7* 21.6* 20.6* 24.3*  MCV 83.5 84.7 84.4 87.1  PLT 223 236 251 206   Cardiac Enzymes:  Recent Labs Lab 08/12/14 1649  CKTOTAL 27   BNP (last 3 results) No results found for this basename: PROBNP,  in the last 8760 hours CBG:  Recent Labs Lab 08/12/14 1953 08/13/14 0746 08/13/14 1114 08/13/14 2114 08/14/14 0804  GLUCAP 208* 156* 181* 111* 129*    Recent Results (from the past 240 hour(s))  CULTURE, BLOOD (ROUTINE X 2)     Status: None   Collection Time    08/11/14  8:43 PM      Result Value Ref Range Status   Specimen Description BLOOD LEFT HAND   Final   Special Requests BOTTLES DRAWN AEROBIC AND ANAEROBIC 10CC EACH   Final   Culture  Setup Time     Final   Value: 08/12/2014 03:41     Performed at [facility]   Culture     Final   Value: GRAM POSITIVE COCCI IN CLUSTERS     Note: Culture results may be compromised due to an excessive volume of blood received in  culture bottles. Gram Stain Report Called to,Read Back By and Verified With: Jannette Spanner RN ON 09.25.15 AT 1938 BY HENDJ     Performed at [facility]   Report Status PENDING   Incomplete  CULTURE, BLOOD (ROUTINE X 2)     Status: None   Collection Time    08/11/14  9:00 PM      Result Value Ref Range Status   Specimen Description BLOOD LEFT HAND   Final   Special Requests BOTTLES DRAWN AEROBIC ONLY 5CC   Final   Culture  Setup Time     Final   Value: 08/12/2014 03:38     Performed at [facility]   Culture     Final   Value: STAPHYLOCOCCUS AUREUS     Note: RIFAMPIN AND GENTAMICIN SHOULD NOT BE USED AS  SINGLE DRUGS FOR TREATMENT OF STAPH INFECTIONS.     Note: Gram Stain Report Called to,Read Back By and Verified With: ELENA MOSQUEDA @ 1038 ON [id number] BY [facility]     Performed at [facility]   Report Status PENDING   Incomplete  URINE CULTURE     Status: None   Collection Time    08/12/14  6:00 PM      Result Value Ref Range Status   Specimen Description URINE, RANDOM   Final   Special Requests NONE   Final   Culture  Setup Time     Final   Value: 08/12/2014 20:13     Performed at [facility] Count     Final   Value: NO GROWTH     Performed at [facility]   Culture     Final   Value: NO GROWTH     Performed at [facility]   Report Status 08/13/2014 FINAL   Final     Studies: Dg Femur Right  08/13/2014   CLINICAL DATA:  Right lower extremity infection.  EXAM: RIGHT FEMUR - 2 VIEW  COMPARISON:  None.  FINDINGS: No fracture dislocation is noted. 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 No lytic destruction is seen to suggest osteomyelitis.  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 No acute abnormality seen in the right femur.   Electronically Signed   By: Roque Lias M.D.   On: 08/13/2014 14:15   Dg Tibia/fibula Right  08/13/2014   CLINICAL DATA:  Lower extremity infection ;  diabetes mellitus  EXAM: RIGHT TIBIA AND FIBULA - 2 VIEW  COMPARISON:  None.  FINDINGS: Frontal and lateral views were obtained. There is extensive vascular calcification with multiple phleboliths as well as arterial vascular calcification. There is no fracture or dislocation. There is no erosive change or bony destruction. There is osteoarthritic change in the right knee and ankle. There are spurs arising from the inferior calcaneus. There are soft tissue calcifications volar to the calcaneus.  IMPRESSION: Extensive soft tissue calcification. Suspect venous stasis as an major cause of some of this calcification. Other areas are consistent with arterial vascular calcification. This arterial calcification most likely is related to the patient's known diabetes mellitus. There is no demonstrable bony destruction. No fracture or dislocation. There is arthropathy in the right knee and ankle joint regions.   Electronically Signed   By: Bretta Bang M.D.   On: 08/13/2014 14:14    Scheduled Meds: . sodium chloride   Intravenous Once  . ARIPiprazole  5 mg Oral Daily  . DAPTOmycin (CUBICIN)  IV  1,000 mg Intravenous Q48H  . feeding supplement (GLUCERNA SHAKE)  237 mL Oral TID BM  . feeding supplement (PRO-STAT SUGAR FREE 64)  30 mL Oral BID  . hydrocerin  1 application Topical Daily  . insulin aspart  0-5 Units Subcutaneous QHS  . insulin aspart  0-9 Units Subcutaneous TID WC  . insulin detemir  15 Units Subcutaneous QHS  . linezolid  600 mg Intravenous Q12H  . LORazepam  2 mg Intravenous Once  . rifampin (RIFADIN) IVPB  300 mg Intravenous 3 times per day  . sodium chloride  3 mL Intravenous Q12H   Continuous Infusions:   Principal Problem:   Endocarditis of aortic valve Active Problems:   Infectious aortic insufficiency   Acute encephalopathy   CKD (chronic kidney disease)   Leg ulcer  Diabetes mellitus   HTN (hypertension)   History of DVT (deep vein thrombosis)   Endocarditis    Bacteremia   DNR (do not resuscitate) discussion   Weakness generalized   Palliative care encounter    Time spent: 35 minutes    [facility] MD [facility] Pager [phone]. If 7PM-7AM, please contact night-coverage at [email or web address], password [facility] 08/14/2014, 2:20 PM  LOS: 3 days

## 2014-08-15 LAB — CBC WITH DIFFERENTIAL/PLATELET
BASOS ABS: 0.1 10*3/uL (ref 0.0–0.1)
Basophils Relative: 0 % (ref 0–1)
EOS PCT: 1 % (ref 0–5)
Eosinophils Absolute: 0.1 10*3/uL (ref 0.0–0.7)
HCT: 25.5 % — ABNORMAL LOW (ref 39.0–52.0)
Hemoglobin: 8.3 g/dL — ABNORMAL LOW (ref 13.0–17.0)
Lymphocytes Relative: 7 % — ABNORMAL LOW (ref 12–46)
Lymphs Abs: 1.4 10*3/uL (ref 0.7–4.0)
MCH: 28.3 pg (ref 26.0–34.0)
MCHC: 32.5 g/dL (ref 30.0–36.0)
MCV: 87 fL (ref 78.0–100.0)
Monocytes Absolute: 1.7 10*3/uL — ABNORMAL HIGH (ref 0.1–1.0)
Monocytes Relative: 9 % (ref 3–12)
NEUTROS ABS: 15.6 10*3/uL — AB (ref 1.7–7.7)
NEUTROS PCT: 83 % — AB (ref 43–77)
PLATELETS: 193 10*3/uL (ref 150–400)
RBC: 2.93 MIL/uL — AB (ref 4.22–5.81)
RDW: 18.6 % — AB (ref 11.5–15.5)
WBC: 18.8 10*3/uL — AB (ref 4.0–10.5)

## 2014-08-15 LAB — CULTURE, BLOOD (ROUTINE X 2)

## 2014-08-15 LAB — RENAL FUNCTION PANEL
ALBUMIN: 2.1 g/dL — AB (ref 3.5–5.2)
ANION GAP: 18 — AB (ref 5–15)
BUN: 43 mg/dL — AB (ref 6–23)
CHLORIDE: 96 meq/L (ref 96–112)
CO2: 23 mEq/L (ref 19–32)
Calcium: 7.9 mg/dL — ABNORMAL LOW (ref 8.4–10.5)
Creatinine, Ser: 4.43 mg/dL — ABNORMAL HIGH (ref 0.50–1.35)
GFR calc Af Amer: 17 mL/min — ABNORMAL LOW (ref >90)
GFR, EST NON AFRICAN AMERICAN: 15 mL/min — AB (ref >90)
Glucose, Bld: 156 mg/dL — ABNORMAL HIGH (ref 70–99)
POTASSIUM: 4.5 meq/L (ref 3.7–5.3)
Phosphorus: 5.2 mg/dL — ABNORMAL HIGH (ref 2.3–4.6)
Sodium: 137 mEq/L (ref 137–147)

## 2014-08-15 LAB — GLUCOSE, CAPILLARY
GLUCOSE-CAPILLARY: 167 mg/dL — AB (ref 70–99)
GLUCOSE-CAPILLARY: 171 mg/dL — AB (ref 70–99)
GLUCOSE-CAPILLARY: 145 mg/dL — AB (ref 70–99)
Glucose-Capillary: 132 mg/dL — ABNORMAL HIGH (ref 70–99)

## 2014-08-15 MED ORDER — SODIUM CHLORIDE 0.9 % IV SOLN
1000.0000 mg | INTRAVENOUS | Status: DC
  Administered 2014-08-15 – 2014-08-19 (×3): 1000 mg via INTRAVENOUS
  Filled 2014-08-15 (×4): qty 20

## 2014-08-15 NOTE — Progress Notes (Signed)
Admit: 08/11/2014 LOS: 4  77M AoCKD5 (relatively normal complement levels), now anuric and recent start RRT (9/21 at [facility]) w/ MRSA AV endocarditits, morbid obesity, s/p R BKA, chronic LLE stasis.  Not surgical candidate.  Dapto/Linezolid/Rifampin  Subjective:  HD yesterday Femoral HD catheter removed after HD +BCx from 9/23, Staph; Sn pending Low affect this AM; family sleeping in room  09/26 0701 - 09/27 0700 In: 903 [P.O.:480; I.V.:3; IV Piggyback:420] Out: 3001   Filed Weights   08/14/14 0500 08/14/14 1102 08/14/14 1520  Weight: 178 kg (392 lb 6.7 oz) 178.2 kg (392 lb 13.8 oz) 176.1 kg (388 lb 3.7 oz)    Scheduled Meds: . sodium chloride   Intravenous Once  . ARIPiprazole  5 mg Oral Daily  . DAPTOmycin (CUBICIN)  IV  1,000 mg Intravenous Q48H  . feeding supplement (GLUCERNA SHAKE)  237 mL Oral TID BM  . feeding supplement (PRO-STAT SUGAR FREE 64)  30 mL Oral BID  . hydrocerin  1 application Topical Daily  . insulin aspart  0-5 Units Subcutaneous QHS  . insulin aspart  0-9 Units Subcutaneous TID WC  . insulin detemir  15 Units Subcutaneous QHS  . linezolid  600 mg Intravenous Q12H  . LORazepam  2 mg Intravenous Once  . rifampin (RIFADIN) IVPB  300 mg Intravenous 3 times per day  . sodium chloride  3 mL Intravenous Q12H   Continuous Infusions:  PRN Meds:.acetaminophen, albuterol, LORazepam, ondansetron (ZOFRAN) IV, oxyCODONE  Current Labs: reviewed    Physical Exam:  Blood pressure 121/49, pulse 97, temperature 98.5 F (36.9 C), temperature source Oral, resp. rate 26, height  (2.057 m), weight 176.1 kg (388 lb 3.7 oz), SpO2 95.00%. NAD, in bed, obese RRR, nl s1s2, no murmur CTAB ant auscultation Chronic scaling changes of LLE S/nt/nd Nonfocal  Assessment 1. AoCKD5 (BL SCr 3s), now req RRT  2. Access; had temp L femoral catheter, removed 08/14/14; no current HD acces (holiday) 3. MRSA AV Endocarditis, 1cm vegetation, not operative candidate on  Dapto/Linezolid/Rifampin 4. Persistent bacteremia (9/23 BCx 2/2 Positive) 5. Morbid obesity, bed bound 6. Hx/o DVT w/ IVC filter  Plan 1. Next HD tentative 9/29, needs access first; wait as able to improve likelihood of clearance 2. Long term HD will be challenging, if needed 3. Daily renal panel; strict I/Os  Sabra Heck MD 08/15/2014, 9:07 AM   Recent Labs Lab 08/11/14 2100 08/12/14 1205 08/13/14 1635 08/14/14 0555  NA 140 142 135* 138  K 5.3 5.5* 5.4* 4.7  CL 101 101 98 97  CO2 GLUCOSE 176* 211* 156* 106*  BUN 79* 83* 86* 63*  CREATININE 5.51* 6.26* 6.48* 5.25*  CALCIUM 8.1* 8.1* 8.0* 7.9*  PHOS 5.1*  --  5.8* 5.4*    Recent Labs Lab 08/11/14 2222 08/12/14 1012 08/13/14 1635 08/14/14 0555  WBC 21.5* 22.9* 21.4* 18.0*  NEUTROABS 18.5* 20.3* 17.9*  --   HGB 7.1* 7.1* 6.8* 8.2*  HCT 20.7* 21.6* 20.6* 24.3*  MCV 83.5 84.7 84.4 87.1  PLT 223 236 251 206

## 2014-08-15 NOTE — Progress Notes (Signed)
[facility] PROGRESS NOTE  Angel Mays EAV:[id number] DOB: 04-29-1971 DOA: 08/11/2014 PCP: Abner Greenspan, MD  Assessment/Plan: #1 aortic valvular endocarditis/aortic valvular insufficiency Questionable etiology. Infection may be secondary to ulcer on the lower extremity which may have seeded. Patient has been seen by cardiology and cardiothoracic surgery and patient deemed not operative candidate. MRI head pending as per wife patient with memory issues. MRI was attempted and patient given about 4 mg of IV Ativan however unable to be done. Patient at this point in time is refusing MRI under sedation and as such cancelled MRI. Continue empiric IV Cubicin and IV Zyvox and rifampin for now. CVTS, ID, cardiology following and appreciate input and recommendations. Per cardiology and cardiothoracic surgery patient has a poor prognosis. Palliative care consult pending for goals of care.  #2 MRSA bacteremia Repeat Blood cx with GPC in clusters. Continue empiric IV Cubicin and IV Zyvox, and Rifampin. ID following and appreciate input and recommendations.  #3 right lower extremity wound/infection Continue current IV antibiotics. Patient has been seen by wound care, and recommendations made. X-rays of the right lower extremity negative for osteomyelitis.   #4 well conreolled DM2 CBGs ranged from 132-167. Continue home regimen of long-acting insulin. Sliding scale insulin.  #5 chronic kidney disease stage IV/5 Per wife patient has been temporarily on hemodialysis while in the hospital at [facility] which was started 08/09/2014. Patient with poor urinary output. Patient has been seen by renal and patient underwent HD x 2. Femoral HD catheter removed yesterday post HD.    #6 anemia The admission H&P patient is status post 5 units packed red blood cells at [place]. Follow H&H. Hemoglobin currently at 8.3. Transfusion threshhold Hgb <7.   #7 history of DVT Patient was on Elliquis, however this was felt  secondary to anemia and chronic kidney disease. Patient is status post IVC filter. ?? Infected, remove IVC, defer to ID.  #8 prophylaxis PPI for GI prophylaxis. SCDs for DVT prophylaxis.  #9 prognosis Patient with a very poor prognosis. Patient is presenting with aortic valvular endocarditis, MRSA bacteremia, chronic kidney disease stage IV/5 currently on HD started 08/09/14 at [facility], right lower extremity wound infection, type II diabetic, morbid obesity, chronic lower extremity venostasis and also noted to be anemic. Patient deemed not a surgical candidate. Palliative care for goals of care pending.  Code Status: Full Family Communication: Updated patient and wife and children at bedside. Disposition Plan: Home versus SNF when medically stable.   Consultants:  ID: Dr. Ninetta Lights 08/12/2014  Nephrology: Dr.Powell 08/12/2014  Cardiothoracic surgery Dr. Dorris Fetch 08/11/2014  Cardiology: Dr. Rennis Golden 08/11/2014  Procedures:  Chest x-ray 08/12/2014  TEE 08/11/2014  X-ray of the right lower extremity 08/13/2014  Antibiotics: Total days of antibiotics day 8  IV daptomycin 08/12/2014  IV Zyvox 08/11/2014  Oral rifampin 08/12/2014  HPI/Subjective: Patient denies any chest pain. Patient denies any shortness of breath.   Objective: Filed Vitals:   08/15/14 0704  BP: 121/49  Pulse: 97  Temp:   Resp:     Intake/Output Summary (Last 24 hours) at 08/15/14 1058 Last data filed at 08/15/14 0800  Gross per 24 hour  Intake    900 ml  Output   3001 ml  Net  -2101 ml   Filed Weights   08/14/14 0500 08/14/14 1102 08/14/14 1520  Weight: 178 kg (392 lb 6.7 oz) 178.2 kg (392 lb 13.8 oz) 176.1 kg (388 lb 3.7 oz)    Exam:   General:  NAD  Cardiovascular: RRR  Respiratory:  CTAB  Abdomen: Soft, nontender, nondistended, positive bowel sounds.  Musculoskeletal: No clubbing cyanosis or edema. Right leg with chronic venous stasis changes. Right lower extremity with  bandage.  Data Reviewed: Basic Metabolic Panel:  Recent Labs Lab 08/11/14 2100 08/12/14 1205 08/13/14 1635 08/14/14 0555  NA 140 142 135* 138  K 5.3 5.5* 5.4* 4.7  CL 101 101 98 97  CO2 GLUCOSE 176* 211* 156* 106*  BUN 79* 83* 86* 63*  CREATININE 5.51* 6.26* 6.48* 5.25*  CALCIUM 8.1* 8.1* 8.0* 7.9*  MG 2.1 2.1  --   --   PHOS 5.1*  --  5.8* 5.4*   Liver Function Tests:  Recent Labs Lab 08/11/14 2100 08/12/14 1205 08/13/14 1635 08/14/14 0555  AST 90* 78*  --   --   ALT 44 52  --   --   ALKPHOS 129* 126*  --   --   BILITOT 1.3* 1.4*  --   --   PROT 6.1 6.3  --   --   ALBUMIN 2.0* 2.1* 2.1* 2.1*   No results found for this basename: LIPASE, AMYLASE,  in the last 168 hours No results found for this basename: AMMONIA,  in the last 168 hours CBC:  Recent Labs Lab 08/11/14 2222 08/12/14 1012 08/13/14 1635 08/14/14 0555 08/15/14 0939  WBC 21.5* 22.9* 21.4* 18.0* 18.8*  NEUTROABS 18.5* 20.3* 17.9*  --  15.6*  HGB 7.1* 7.1* 6.8* 8.2* 8.3*  HCT 20.7* 21.6* 20.6* 24.3* 25.5*  MCV 83.5 84.7 84.4 87.1 87.0  PLT 223 236 251 206 193   Cardiac Enzymes:  Recent Labs Lab 08/12/14 1649  CKTOTAL 27   BNP (last 3 results) No results found for this basename: PROBNP,  in the last 8760 hours CBG:  Recent Labs Lab 08/13/14 2114 08/14/14 0804 08/14/14 1617 08/14/14 2045 08/15/14 0733  GLUCAP 111* 129* 104* 152* 132*    Recent Results (from the past 240 hour(s))  CULTURE, BLOOD (ROUTINE X 2)     Status: None   Collection Time    08/11/14  8:43 PM      Result Value Ref Range Status   Specimen Description BLOOD LEFT HAND   Final   Special Requests BOTTLES DRAWN AEROBIC AND ANAEROBIC 10CC EACH   Final   Culture  Setup Time     Final   Value: 08/12/2014 03:41     Performed at [facility]   Culture     Final   Value: GRAM POSITIVE COCCI IN CLUSTERS     Note: Culture results may be compromised due to an excessive volume of blood received in  culture bottles. Gram Stain Report Called to,Read Back By and Verified With: Jannette Spanner RN ON 09.25.15 AT 1938 BY HENDJ     Performed at [facility]   Report Status 08/15/2014 FINAL   Final  CULTURE, BLOOD (ROUTINE X 2)     Status: None   Collection Time    08/11/14  9:00 PM      Result Value Ref Range Status   Specimen Description BLOOD LEFT HAND   Final   Special Requests BOTTLES DRAWN AEROBIC ONLY 5CC   Final   Culture  Setup Time     Final   Value: 08/12/2014 03:38     Performed at [facility]   Culture     Final   Value: METHICILLIN RESISTANT STAPHYLOCOCCUS AUREUS     Note: RIFAMPIN AND GENTAMICIN SHOULD NOT  BE USED AS SINGLE DRUGS FOR TREATMENT OF STAPH INFECTIONS. CRITICAL RESULT CALLED TO, READ BACK BY AND VERIFIED WITH: JANET [facility] 08/15/14 @ 10:10AM BY RUSCOE A.     Note: Gram Stain Report Called to,Read Back By and Verified With: ELENA MOSQUEDA @ 1038 ON [id number] BY [facility]     Performed at [facility]   Report Status 08/15/2014 FINAL   Final   Organism ID, Bacteria METHICILLIN RESISTANT STAPHYLOCOCCUS AUREUS   Final  URINE CULTURE     Status: None   Collection Time    08/12/14  6:00 PM      Result Value Ref Range Status   Specimen Description URINE, RANDOM   Final   Special Requests NONE   Final   Culture  Setup Time     Final   Value: 08/12/2014 20:13     Performed at [facility]   Colony Count     Final   Value: NO GROWTH     Performed at [facility]   Culture     Final   Value: NO GROWTH     Performed at [facility]   Report Status 08/13/2014 FINAL   Final     Studies: Dg Femur Right  08/13/2014   CLINICAL DATA:  Right lower extremity infection.  EXAM: RIGHT FEMUR - 2 VIEW  COMPARISON:  None.  FINDINGS: No fracture dislocation is noted. 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 No lytic destruction is seen to suggest osteomyelitis.  IMPRESSION: Severe degenerative joint disease of the right  knee. No acute abnormality seen in the right femur.   Electronically Signed   By: Roque Lias M.D.   On: 08/13/2014 14:15   Dg Tibia/fibula Right  08/13/2014   CLINICAL DATA:  Lower extremity infection ; diabetes mellitus  EXAM: RIGHT TIBIA AND FIBULA - 2 VIEW  COMPARISON:  None.  FINDINGS: Frontal and lateral views were obtained. There is extensive vascular calcification with multiple phleboliths as well as arterial vascular calcification. There is no fracture or dislocation. There is no erosive change or bony destruction. There is osteoarthritic change in the right knee and ankle. There are spurs arising from the inferior calcaneus. There are soft tissue calcifications volar to the calcaneus.  IMPRESSION: Extensive soft tissue calcification. Suspect venous stasis as an major cause of some of this calcification. Other areas are consistent with arterial vascular calcification. This arterial calcification most likely is related to the patient's known diabetes mellitus. There is no demonstrable bony destruction. No fracture or dislocation. There is arthropathy in the right knee and ankle joint regions.   Electronically Signed   By: Bretta Bang M.D.   On: 08/13/2014 14:14    Scheduled Meds: . sodium chloride   Intravenous Once  . ARIPiprazole  5 mg Oral Daily  . DAPTOmycin (CUBICIN)  IV  1,000 mg Intravenous Q48H  . feeding supplement (GLUCERNA SHAKE)  237 mL Oral TID BM  . feeding supplement (PRO-STAT SUGAR FREE 64)  30 mL Oral BID  . hydrocerin  1 application Topical Daily  . insulin aspart  0-5 Units Subcutaneous QHS  . insulin aspart  0-9 Units Subcutaneous TID WC  . insulin detemir  15 Units Subcutaneous QHS  . linezolid  600 mg Intravenous Q12H  . LORazepam  2 mg Intravenous Once  . rifampin (RIFADIN) IVPB  300 mg Intravenous 3 times per day  . sodium chloride  3 mL Intravenous Q12H   Continuous  Infusions:   Principal Problem:   Endocarditis of aortic valve Active Problems:    Infectious aortic insufficiency   Acute encephalopathy   CKD (chronic kidney disease)   Leg ulcer   Diabetes mellitus   HTN (hypertension)   History of DVT (deep vein thrombosis)   Endocarditis   Bacteremia   DNR (do not resuscitate) discussion   Weakness generalized   Palliative care encounter    Time spent: 35 minutes    [facility] MD [facility] Pager [phone]. If 7PM-7AM, please contact night-coverage at [email or web address], password [facility] 08/15/2014, 10:58 AM  LOS: 4 days

## 2014-08-15 NOTE — Progress Notes (Addendum)
ANTIBIOTIC CONSULT NOTE  Pharmacy Consult for daptomycin Indication: MRSA bacteremia  Allergies  Allergen Reactions  . Omnipaque [Iohexol]     Patient Measurements: Height:  (205.7 cm) Weight: 388 lb 3.7 oz (176.1 kg) IBW/kg (Calculated) : 98.3 Adjusted Body Weight:   Vital Signs: Temp: 98.5 F (36.9 C) (09/27 0542) Temp src: Oral (09/27 0542) BP: 121/49 mmHg (09/27 0704) Pulse Rate: 97 (09/27 0704) Intake/Output from previous day: 09/26 0701 - 09/27 0700 In: 903 [P.O.:480; I.V.:3; IV Piggyback:420] Out: 3001  Intake/Output from this shift: Total I/O In: 240 [P.O.:240] Out: -   Labs:  Recent Labs  08/13/14 1635 08/14/14 0555 08/15/14 0939  WBC 21.4* 18.0* 18.8*  HGB 6.8* 8.2* 8.3*  PLT 251 206 193  CREATININE 6.48* 5.25* 4.43*    Medical History: Past Medical History  Diagnosis Date  . Normocytic anemia   . Diabetes mellitus with complication   . GERD (gastroesophageal reflux disease)   . Hypertension   . Venous stasis ulcers     a. a. 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 RLE with recurrent cellulitis.  . Morbid obesity   . CKD (chronic kidney disease), stage III   . Gout   . Sepsis     a. 06/2014 MRSA Sepsis->4 wks of Vanc;  b. 06/2014 Echo: EF 55-60%, mild conc LVH, mod dil LA/RA, mild Ao sclerosis w/o stenosis, no veg/mass.  . Peripheral neuropathy   . Depression   . Cataract   . Seasonal allergies   . Abdominal abscess     a. s/p prior drainage.  Marland Kitchen DVT (deep venous thrombosis)     a. On Eliquis, s/p IVC filter.    Medications:  See EMR  Assessment: 43 yo male with MRSA bacteremia, possibly secondary to ulcer on lower extremity.  Recently completed 4 week course of vancomycin; therefore, was placed on daptomycin, zyvox and rifampin.  Pt also has CKD stage V.  HIV was nonreactive.  Goal of Therapy:  Resolution of infection   Plan:  -Continue dapto 1 g IV q48h -Continue linezolid 600 mg IV q12h -Continue rifampin 300 mg IV q8h -Monitor CBC while on  Zyvox, f/u transition to PO  -Monitor HD tolerance, duration of therapy -CK qTues while on dapto   Agapito Games, PharmD, BCPS Clinical Pharmacist Pager: [phone] 08/15/2014 1:37 PM     Agapito Games, PharmD, BCPS Clinical Pharmacist Pager: [phone] 08/15/2014 1:27 PM

## 2014-08-15 NOTE — Progress Notes (Signed)
TC [facility] lab reporting blood cx MRSA positive, Dr. Janee Morn aware

## 2014-08-16 LAB — RENAL FUNCTION PANEL
Albumin: 2.2 g/dL — ABNORMAL LOW (ref 3.5–5.2)
Anion gap: 19 — ABNORMAL HIGH (ref 5–15)
BUN: 49 mg/dL — ABNORMAL HIGH (ref 6–23)
CHLORIDE: 96 meq/L (ref 96–112)
CO2: 21 meq/L (ref 19–32)
Calcium: 8.1 mg/dL — ABNORMAL LOW (ref 8.4–10.5)
Creatinine, Ser: 5.11 mg/dL — ABNORMAL HIGH (ref 0.50–1.35)
GFR, EST AFRICAN AMERICAN: 14 mL/min — AB (ref >90)
GFR, EST NON AFRICAN AMERICAN: 12 mL/min — AB (ref >90)
Glucose, Bld: 124 mg/dL — ABNORMAL HIGH (ref 70–99)
Phosphorus: 6.2 mg/dL — ABNORMAL HIGH (ref 2.3–4.6)
Potassium: 4.6 mEq/L (ref 3.7–5.3)
SODIUM: 136 meq/L — AB (ref 137–147)

## 2014-08-16 LAB — GLUCOSE, CAPILLARY
GLUCOSE-CAPILLARY: 127 mg/dL — AB (ref 70–99)
GLUCOSE-CAPILLARY: 141 mg/dL — AB (ref 70–99)
Glucose-Capillary: 175 mg/dL — ABNORMAL HIGH (ref 70–99)
Glucose-Capillary: 136 mg/dL — ABNORMAL HIGH (ref 70–99)

## 2014-08-16 LAB — CBC WITH DIFFERENTIAL/PLATELET
Basophils Absolute: 0.1 10*3/uL (ref 0.0–0.1)
Basophils Relative: 0 % (ref 0–1)
EOS ABS: 0.1 10*3/uL (ref 0.0–0.7)
Eosinophils Relative: 1 % (ref 0–5)
HCT: 25.7 % — ABNORMAL LOW (ref 39.0–52.0)
HEMOGLOBIN: 8.3 g/dL — AB (ref 13.0–17.0)
LYMPHS ABS: 1.4 10*3/uL (ref 0.7–4.0)
LYMPHS PCT: 8 % — AB (ref 12–46)
MCH: 28.2 pg (ref 26.0–34.0)
MCHC: 32.3 g/dL (ref 30.0–36.0)
MCV: 87.4 fL (ref 78.0–100.0)
MONOS PCT: 8 % (ref 3–12)
Monocytes Absolute: 1.5 10*3/uL — ABNORMAL HIGH (ref 0.1–1.0)
NEUTROS ABS: 14.6 10*3/uL — AB (ref 1.7–7.7)
Neutrophils Relative %: 83 % — ABNORMAL HIGH (ref 43–77)
PLATELETS: 202 10*3/uL (ref 150–400)
RBC: 2.94 MIL/uL — ABNORMAL LOW (ref 4.22–5.81)
RDW: 19 % — ABNORMAL HIGH (ref 11.5–15.5)
WBC: 17.7 10*3/uL — AB (ref 4.0–10.5)

## 2014-08-16 MED ORDER — ZOLPIDEM TARTRATE 5 MG PO TABS
5.0000 mg | ORAL_TABLET | Freq: Once | ORAL | Status: AC
  Administered 2014-08-16: 5 mg via ORAL
  Filled 2014-08-16: qty 1

## 2014-08-16 MED ORDER — CALCIUM ACETATE 667 MG PO CAPS
667.0000 mg | ORAL_CAPSULE | Freq: Three times a day (TID) | ORAL | Status: DC
  Administered 2014-08-18 – 2014-08-20 (×8): 667 mg via ORAL
  Filled 2014-08-16 (×15): qty 1

## 2014-08-16 NOTE — Progress Notes (Addendum)
Patient Name: Angel Mays Date of Encounter: 08/16/2014  Principal Problem:   Endocarditis of aortic valve Active Problems:   Acute encephalopathy   CKD (chronic kidney disease)   Leg ulcer   Diabetes mellitus   HTN (hypertension)   History of DVT (deep vein thrombosis)   Endocarditis   Infectious aortic insufficiency   Bacteremia   DNR (do not resuscitate) discussion   Weakness generalized   Palliative care encounter    Patient Profile: 43 yo male w/ hx DM, L-BKA, HTN, MRSA sepsis 06/2014, hx DVT w/ IVC filter, chronic venous stasis w/ infection RLE, CKD on HD (initially felt temporary). Admitted to [facility] for fever & troponin elevation, peak 1.30. TEE showed large AoV vegetation causing AI. TCTS has seen, not a surgical candidate. Palliative care has seen.    SUBJECTIVE: Feels tired, no appetite. Not taking much PO. Denies chest pain. Some low-grade fevers overnight. Very weak.   OBJECTIVE Filed Vitals:   08/15/14 0704 08/15/14 1339 08/15/14 2037 08/16/14 0623  BP: 121/49 129/52 129/49 125/43  Pulse: 97 101 104 104  Temp:  98.8 F (37.1 C) 99.1 F (37.3 C) 97.6 F (36.4 C)  TempSrc:  Oral    Resp:   18 18  Height:      Weight:    392 lb 11.2 oz (178.128 kg)  SpO2:  95% 96% 95%    Intake/Output Summary (Last 24 hours) at 08/16/14 1610 Last data filed at 08/16/14 0400  Gross per 24 hour  Intake   1620 ml  Output      0 ml  Net   1620 ml   Filed Weights   08/14/14 1102 08/14/14 1520 08/16/14 0623  Weight: 392 lb 13.8 oz (178.2 kg) 388 lb 3.7 oz (176.1 kg) 392 lb 11.2 oz (178.128 kg)    PHYSICAL EXAM General: Well developed, chronically ill-appearing, male in no acute distress. Head: Normocephalic, atraumatic.  Neck: Supple without bruits, JVD approx 9 cm. Lungs:  Resp regular and unlabored, rales bases. Heart: RRR, S1, S2, no S3, S4, + SEM, ? DM; no rub. Abdomen: Soft, non-tender, non-distended, BS + x 4.  Extremities: No clubbing, cyanosis, no edema.  Right foot blackened Neuro: A little lethargic, but oriented X 3. Moves all extremities spontaneously. Psych: flat affect.  LABS: CBC: Recent Labs  08/15/14 0939 08/16/14 0418  WBC 18.8* 17.7*  NEUTROABS 15.6* 14.6*  HGB 8.3* 8.3*  HCT 25.5* 25.7*  MCV 87.0 87.4  PLT 193 202   Basic Metabolic Panel: Recent Labs  08/15/14 0939 08/16/14 0418  NA 137 136*  K 4.5 4.6  CL 96 96  CO2 23 21  GLUCOSE 156* 124*  BUN 43* 49*  CREATININE 4.43* 5.11*  CALCIUM 7.9* 8.1*  PHOS 5.2* 6.2*   Liver Function Tests: Recent Labs  08/15/14 0939 08/16/14 0418  ALBUMIN 2.1* 2.2*    TELE:  Mostly ST, o/w SR   Current Medications:  . sodium chloride   Intravenous Once  . ARIPiprazole  5 mg Oral Daily  . DAPTOmycin (CUBICIN)  IV  1,000 mg Intravenous Q48H  . feeding supplement (GLUCERNA SHAKE)  237 mL Oral TID BM  . feeding supplement (PRO-STAT SUGAR FREE 64)  30 mL Oral BID  . hydrocerin  1 application Topical Daily  . insulin aspart  0-5 Units Subcutaneous QHS  . insulin aspart  0-9 Units Subcutaneous TID WC  . insulin detemir  15 Units Subcutaneous QHS  . linezolid  600 mg  Intravenous Q12H  . LORazepam  2 mg Intravenous Once  . rifampin (RIFADIN) IVPB  300 mg Intravenous 3 times per day  . 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 Not a surgical candidate. Very poor overall prognosis as noted by both Drs. Hendrickson & Hilty  Active Problems:   Acute encephalopathy - per IM, unable to do MRI despite sedation    CKD (chronic kidney disease) - per IM/Renal teams, he is for HD 09/29     Leg ulcer - per IM/ID    Diabetes mellitus - per IM    HTN (hypertension) - per IM, BP generally well-controlled here    History of DVT (deep vein thrombosis) - has IVC filter, per IM    Infectious aortic insufficiency - see above    Bacteremia - per IM/ID     DNR (do not resuscitate) discussion - per IM/Palliative care    Weakness generalized - per IM    Palliative care encounter - per IM/Palliative care  Cardiology currently has no new treatment recs. Will see again PRN.  Signed, Theodore Demark , PA-C 9:22 AM 08/16/2014  Personally seen and examined. Agree with above. Unfortunate situation. Awaiting hospice conversation. Wife asks daily if there is anything else we can do. Understands conversation previously with Dr. Dorris Fetch.   Signing off. Please call if ? Donato Schultz, MD

## 2014-08-16 NOTE — Progress Notes (Signed)
I have seen and examined this patient and agree with the plan of care.   Lamont Glasscock W 08/16/2014, 10:54 AM  

## 2014-08-16 NOTE — Progress Notes (Signed)
Family declined chaplain visit at this time; may appreciate one later. Chaplain will try to check in again another day.  Wille Glaser 08/16/2014 2:08 PM

## 2014-08-16 NOTE — Progress Notes (Signed)
[facility] PROGRESS NOTE  Angel Mays WGN:[id number] DOB: 18-Dec-1970 DOA: 08/11/2014 PCP: Abner Greenspan, MD  Assessment/Plan: #1 aortic valvular endocarditis/aortic valvular insufficiency Questionable etiology. Infection may be secondary to ulcer on the lower extremity which may have seeded. Patient has been seen by cardiology and cardiothoracic surgery and patient deemed not operative candidate. MRI head pending as per wife patient with memory issues. MRI was attempted and patient given about 4 mg of IV Ativan however unable to be done. Patient at this point in time is refusing MRI under sedation and as such cancelled MRI. Continue empiric IV Cubicin and IV Zyvox and rifampin for now. CVTS, ID, cardiology following and appreciate input and recommendations. Per cardiology and cardiothoracic surgery patient has a poor prognosis. Palliative care consult pending for goals of care.  #2 MRSA bacteremia Repeat Blood cx with GPC in clusters. Continue empiric IV Cubicin and IV Zyvox, and Rifampin. ID following and appreciate input and recommendations.  #3 right lower extremity wound/infection Continue current IV antibiotics. Patient has been seen by wound care, and recommendations made. X-rays of the right lower extremity negative for osteomyelitis.   #4 well conreolled DM2 CBGs ranged from 127-175. Continue home regimen of long-acting insulin. Sliding scale insulin.  #5 chronic kidney disease stage IV/5 Per wife patient has been temporarily on hemodialysis while in the hospital at [facility] which was started 08/09/2014. Patient with poor urinary output. Patient has been seen by renal and patient underwent HD x 2. Femoral HD catheter removed yesterday post HD. Per renal.   #6 anemia The admission H&P patient is status post 5 units packed red blood cells at [place]. Follow H&H. Hemoglobin currently at 8.3. Transfusion threshhold Hgb <7.   #7 history of DVT Patient was on Elliquis, however this  was felt secondary to anemia and chronic kidney disease. Patient is status post IVC filter. ?? Infected, remove IVC, defer to ID.  #8 prophylaxis PPI for GI prophylaxis. SCDs for DVT prophylaxis.  #9 prognosis Patient with a very poor prognosis. Patient is presenting with aortic valvular endocarditis, MRSA bacteremia, chronic kidney disease stage IV/5 currently on HD started 08/09/14 at [facility], right lower extremity wound infection, type II diabetic, morbid obesity, chronic lower extremity venostasis and also noted to be anemic. Patient deemed not a surgical candidate. Palliative care for goals of care pending.  Code Status: Full Family Communication: Updated patient and wife and children at bedside. Disposition Plan: Home versus SNF pending palliative eval and when medically stable.   Consultants:  ID: Dr. Ninetta Lights 08/12/2014  Nephrology: Dr.Powell 08/12/2014  Cardiothoracic surgery Dr. Dorris Fetch 08/11/2014  Cardiology: Dr. Rennis Golden 08/11/2014  Procedures:  Chest x-ray 08/12/2014  TEE 08/11/2014  X-ray of the right lower extremity 08/13/2014  Antibiotics: Total days of antibiotics day 8  IV daptomycin 08/12/2014  IV Zyvox 08/11/2014  Oral rifampin 08/12/2014  HPI/Subjective: Patient denies any chest pain. Patient denies any shortness of breath. Flat affect.  Objective: Filed Vitals:   08/16/14 0623  BP: 125/43  Pulse: 104  Temp: 97.6 F (36.4 C)  Resp: 18    Intake/Output Summary (Last 24 hours) at 08/16/14 1057 Last data filed at 08/16/14 0400  Gross per 24 hour  Intake   1620 ml  Output      0 ml  Net   1620 ml   Filed Weights   08/14/14 1102 08/14/14 1520 08/16/14 0623  Weight: 178.2 kg (392 lb 13.8 oz) 176.1 kg (388 lb 3.7 oz) 178.128 kg (392 lb 11.2 oz)  Exam:   General:  NAD  Cardiovascular: RRR  Respiratory: CTAB  Abdomen: Soft, nontender, nondistended, positive bowel sounds.  Musculoskeletal: No clubbing cyanosis or edema. Right leg  with chronic venous stasis changes and ulcer.  Data Reviewed: Basic Metabolic Panel:  Recent Labs Lab 08/11/14 2100 08/12/14 1205 08/13/14 1635 08/14/14 0555 08/15/14 0939 08/16/14 0418  NA 140 142 135* 138 137 136*  K 5.3 5.5* 5.4* 4.7 4.5 4.6  CL 101 101 98 97 96 96  CO2 GLUCOSE 176* 211* 156* 106* 156* 124*  BUN 79* 83* 86* 63* 43* 49*  CREATININE 5.51* 6.26* 6.48* 5.25* 4.43* 5.11*  CALCIUM 8.1* 8.1* 8.0* 7.9* 7.9* 8.1*  MG 2.1 2.1  --   --   --   --   PHOS 5.1*  --  5.8* 5.4* 5.2* 6.2*   Liver Function Tests:  Recent Labs Lab 08/11/14 2100 08/12/14 1205 08/13/14 1635 08/14/14 0555 08/15/14 0939 08/16/14 0418  AST 90* 78*  --   --   --   --   ALT 44 52  --   --   --   --   ALKPHOS 129* 126*  --   --   --   --   BILITOT 1.3* 1.4*  --   --   --   --   PROT 6.1 6.3  --   --   --   --   ALBUMIN 2.0* 2.1* 2.1* 2.1* 2.1* 2.2*   No results found for this basename: LIPASE, AMYLASE,  in the last 168 hours No results found for this basename: AMMONIA,  in the last 168 hours CBC:  Recent Labs Lab 08/11/14 2222 08/12/14 1012 08/13/14 1635 08/14/14 0555 08/15/14 0939 08/16/14 0418  WBC 21.5* 22.9* 21.4* 18.0* 18.8* 17.7*  NEUTROABS 18.5* 20.3* 17.9*  --  15.6* 14.6*  HGB 7.1* 7.1* 6.8* 8.2* 8.3* 8.3*  HCT 20.7* 21.6* 20.6* 24.3* 25.5* 25.7*  MCV 83.5 84.7 84.4 87.1 87.0 87.4  PLT 223 236 251 206 193 202   Cardiac Enzymes:  Recent Labs Lab 08/12/14 1649  CKTOTAL 27   BNP (last 3 results) No results found for this basename: PROBNP,  in the last 8760 hours CBG:  Recent Labs Lab 08/15/14 0733 08/15/14 1205 08/15/14 1641 08/15/14 2020 08/16/14 0735  GLUCAP 132* 167* 171* 145* 127*    Recent Results (from the past 240 hour(s))  CULTURE, BLOOD (ROUTINE X 2)     Status: None   Collection Time    08/11/14  8:43 PM      Result Value Ref Range Status   Specimen Description BLOOD LEFT HAND   Final   Special Requests BOTTLES DRAWN  AEROBIC AND ANAEROBIC 10CC EACH   Final   Culture  Setup Time     Final   Value: 08/12/2014 03:41     Performed at [facility]   Culture     Final   Value: GRAM POSITIVE COCCI IN CLUSTERS     Note: Culture results may be compromised due to an excessive volume of blood received in culture bottles. Gram Stain Report Called to,Read Back By and Verified With: Jannette Spanner RN ON 09.25.15 AT 1938 BY HENDJ     Performed at [facility]   Report Status 08/15/2014 FINAL   Final  CULTURE, BLOOD (ROUTINE X 2)     Status: None   Collection Time    08/11/14  9:00 PM  Result Value Ref Range Status   Specimen Description BLOOD LEFT HAND   Final   Special Requests BOTTLES DRAWN AEROBIC ONLY 5CC   Final   Culture  Setup Time     Final   Value: 08/12/2014 03:38     Performed at [facility]   Culture     Final   Value: METHICILLIN RESISTANT STAPHYLOCOCCUS AUREUS     Note: RIFAMPIN AND GENTAMICIN SHOULD NOT BE USED AS SINGLE DRUGS FOR TREATMENT OF STAPH INFECTIONS. CRITICAL RESULT CALLED TO, READ BACK BY AND VERIFIED WITH: JANET [facility] 08/15/14 @ 10:10AM BY RUSCOE A.     Note: Gram Stain Report Called to,Read Back By and Verified With: ELENA MOSQUEDA @ 1038 ON [id number] BY [facility]     Performed at [facility]   Report Status 08/15/2014 FINAL   Final   Organism ID, Bacteria METHICILLIN RESISTANT STAPHYLOCOCCUS AUREUS   Final  URINE CULTURE     Status: None   Collection Time    08/12/14  6:00 PM      Result Value Ref Range Status   Specimen Description URINE, RANDOM   Final   Special Requests NONE   Final   Culture  Setup Time     Final   Value: 08/12/2014 20:13     Performed at [facility] Count     Final   Value: NO GROWTH     Performed at [facility]   Culture     Final   Value: NO GROWTH     Performed at [facility]   Report Status 08/13/2014 FINAL   Final     Studies: No results found.  Scheduled Meds: . sodium chloride    Intravenous Once  . ARIPiprazole  5 mg Oral Daily  . calcium acetate  667 mg Oral TID WC  . DAPTOmycin (CUBICIN)  IV  1,000 mg Intravenous Q48H  . feeding supplement (GLUCERNA SHAKE)  237 mL Oral TID BM  . feeding supplement (PRO-STAT SUGAR FREE 64)  30 mL Oral BID  . hydrocerin  1 application Topical Daily  . insulin aspart  0-5 Units Subcutaneous QHS  . insulin aspart  0-9 Units Subcutaneous TID WC  . insulin detemir  15 Units Subcutaneous QHS  . linezolid  600 mg Intravenous Q12H  . LORazepam  2 mg Intravenous Once  . rifampin (RIFADIN) IVPB  300 mg Intravenous 3 times per day  . sodium chloride  3 mL Intravenous Q12H   Continuous Infusions:   Principal Problem:   Endocarditis of aortic valve Active Problems:   Infectious aortic insufficiency   Acute encephalopathy   CKD (chronic kidney disease)   Leg ulcer   Diabetes mellitus   HTN (hypertension)   History of DVT (deep vein thrombosis)   Bacteremia   DNR (do not resuscitate) discussion   Weakness generalized   Palliative care encounter    Time spent: 35 minutes    [facility] MD [facility] Pager [phone]. If 7PM-7AM, please contact night-coverage at [email or web address], password [facility] 08/16/2014, 10:57 AM  LOS: 5 days

## 2014-08-16 NOTE — Progress Notes (Signed)
Patient and family related to me that they would like MD or case management to inform them of the most aggressive management options available to Angel Mays at this point in time.  Angel Mays has decided he would prefer to "fight."  Discussed case and current interventions with Maren Reamer, NP on call-for [facility] and also to make aware of patient and family desire for aggressive management strategies.  Continuing to monitor closely.

## 2014-08-16 NOTE — Progress Notes (Signed)
Utilization review completed.  

## 2014-08-16 NOTE — Care Management Note (Signed)
    Page 1 of 2   08/22/2014     12:27:57 PM CARE MANAGEMENT NOTE 09/07/2014  Patient:  Angel Mays,Angel Mays   Account Number:  [id number]  Date Initiated:  08/16/2014  Documentation initiated by:  Donn Pierini  Subjective/Objective Assessment:   Pt admitted with bacteremia, endocarditis, hx- ESRD     Action/Plan:   PTA pt lived at home with spouse, PC consulted for GOC   Anticipated DC Date:  08/24/2014   Anticipated DC Plan:  LONG TERM ACUTE CARE (LTAC)  In-house referral  Clinical Social Worker      DC Associate Professor  CM consult      [facility] Choice  LONG TERM ACUTE CARE   Choice offered to / List presented to:  C-3 Spouse           Status of service:  Completed, signed off Medicare Important Message given?  YES (If response is "NO", the following Medicare IM given date fields will be blank) Date Medicare IM given:  08/16/2014 Medicare IM given by:  Donn Pierini Date Additional Medicare IM given:  09/15/2014 Additional Medicare IM given by:  Isidoro Donning  Discharge Disposition:  LONG TERM ACUTE CARE (LTAC)  Per UR Regulation:  Reviewed for med. necessity/level of care/duration of stay  If discussed at Long Length of Stay Meetings, dates discussed:   08/17/2014    Comments:  08/25/2014- 1100- Donn Pierini RN, BSN [phone] [facility] pt is appropriate for LTAC- spoke with pt's wife- and list of other LTAC hospitals in [place] given to her- informed her that [facility] is ready to offer pt a bed today- pt and wife have chosen to take bed offer with [facility] here. MD has been notified of bed availability and is agreeable to tx today to [facility]. [place] form signed by both MD and pt's wife-  plan to tx pt to [facility] later today.   08/19/14- 1345- Donn Pierini RN, BSN [phone] Referral for LTAC- in to speak with wife at bedside- per conversation wife states that at this time they want to continue with IV abx and HD treatments- want to look at [facility] options- pt has been  to both [facility] and [facility] facilities in the past- per wife she would prefer [facility] - referrals have been made to both- await to hear if pt is appropriate- wife also wants to see if there are any LTAC options toward [place]- NCM to look to see what other LTAC options may be that direction.

## 2014-08-16 NOTE — Progress Notes (Signed)
INFECTIOUS DISEASE PROGRESS NOTE  ID: Angel Mays is a 43 y.o. male with  Principal Problem:   Endocarditis of aortic valve Active Problems:   Acute encephalopathy   CKD (chronic kidney disease)   Leg ulcer   Diabetes mellitus   HTN (hypertension)   History of DVT (deep vein thrombosis)   Infectious aortic insufficiency   Bacteremia   DNR (do not resuscitate) discussion   Weakness generalized   Palliative care encounter  Subjective: Without complaints. No SOB.   Abtx:  Anti-infectives   Start     Dose/Rate Route Frequency Ordered Stop   08/15/14 0200  DAPTOmycin (CUBICIN) 1,000 mg in sodium chloride 0.9 % IVPB     1,000 mg 240 mL/hr over 30 Minutes Intravenous Every 48 hours 08/15/14 0113     08/12/14 2300  DAPTOmycin (CUBICIN) 1,000 mg in sodium chloride 0.9 % IVPB  Status:  Discontinued     1,000 mg 240 mL/hr over 30 Minutes Intravenous Every 48 hours 08/11/14 2038 08/15/14 0112   08/12/14 1515  rifampin (RIFADIN) 300 mg in sodium chloride 0.9 % 100 mL IVPB     300 mg 200 mL/hr over 30 Minutes Intravenous 3 times per day 08/12/14 1449     08/11/14 2200  linezolid (ZYVOX) IVPB 600 mg     600 mg 300 mL/hr over 60 Minutes Intravenous Every 12 hours 08/11/14 1834        Medications:  Scheduled: . sodium chloride   Intravenous Once  . ARIPiprazole  5 mg Oral Daily  . calcium acetate  667 mg Oral TID WC  . DAPTOmycin (CUBICIN)  IV  1,000 mg Intravenous Q48H  . feeding supplement (GLUCERNA SHAKE)  237 mL Oral TID BM  . feeding supplement (PRO-STAT SUGAR FREE 64)  30 mL Oral BID  . hydrocerin  1 application Topical Daily  . insulin aspart  0-5 Units Subcutaneous QHS  . insulin aspart  0-9 Units Subcutaneous TID WC  . insulin detemir  15 Units Subcutaneous QHS  . linezolid  600 mg Intravenous Q12H  . LORazepam  2 mg Intravenous Once  . rifampin (RIFADIN) IVPB  300 mg Intravenous 3 times per day  . sodium chloride  3 mL Intravenous Q12H    Objective: Vital signs  in last 24 hours: Temp:  [97.6 F (36.4 C)-99.1 F (37.3 C)] 97.6 F (36.4 C) (09/28 1610) Pulse Rate:  [104] 104 (09/28 0623) Resp:  [18] 18 (09/28 0623) BP: (125-129)/(43-49) 125/43 mmHg (09/28 0623) SpO2:  [95 %-96 %] 95 % (09/28 0623) Weight:  [178.128 kg (392 lb 11.2 oz)] 178.128 kg (392 lb 11.2 oz) (09/28 9604)   General appearance: alert, cooperative, no distress and morbidly obese Resp: clear to auscultation bilaterally Cardio: regular rate and rhythm GI: normal findings: bowel sounds normal and soft, non-tender and abnormal findings:  obese Extremities: RLE unchanged.   Lab Results  Recent Labs  08/15/14 0939 08/16/14 0418  WBC 18.8* 17.7*  HGB 8.3* 8.3*  HCT 25.5* 25.7*  NA 137 136*  K 4.5 4.6  CL 96 96  CO2 23 21  BUN 43* 49*  CREATININE 4.43* 5.11*   Liver Panel  Recent Labs  08/15/14 0939 08/16/14 0418  ALBUMIN 2.1* 2.2*   Sedimentation Rate No results found for this basename: ESRSEDRATE,  in the last 72 hours C-Reactive Protein No results found for this basename: CRP,  in the last 72 hours  Microbiology: Recent Results (from the past 240 hour(s))  CULTURE, BLOOD (  ROUTINE X 2)     Status: None   Collection Time    08/11/14  8:43 PM      Result Value Ref Range Status   Specimen Description BLOOD LEFT HAND   Final   Special Requests BOTTLES DRAWN AEROBIC AND ANAEROBIC 10CC EACH   Final   Culture  Setup Time     Final   Value: 08/12/2014 03:41     Performed at [facility]   Culture     Final   Value: GRAM POSITIVE COCCI IN CLUSTERS     Note: Culture results may be compromised due to an excessive volume of blood received in culture bottles. Gram Stain Report Called to,Read Back By and Verified With: Jannette Spanner RN ON 09.25.15 AT 1938 BY HENDJ     Performed at [facility]   Report Status 08/15/2014 FINAL   Final  CULTURE, BLOOD (ROUTINE X 2)     Status: None   Collection Time    08/11/14  9:00 PM      Result Value Ref Range  Status   Specimen Description BLOOD LEFT HAND   Final   Special Requests BOTTLES DRAWN AEROBIC ONLY 5CC   Final   Culture  Setup Time     Final   Value: 08/12/2014 03:38     Performed at [facility]   Culture     Final   Value: METHICILLIN RESISTANT STAPHYLOCOCCUS AUREUS     Note: RIFAMPIN AND GENTAMICIN SHOULD NOT BE USED AS SINGLE DRUGS FOR TREATMENT OF STAPH INFECTIONS. CRITICAL RESULT CALLED TO, READ BACK BY AND VERIFIED WITH: JANET [facility] 08/15/14 @ 10:10AM BY RUSCOE A.     Note: Gram Stain Report Called to,Read Back By and Verified With: ELENA MOSQUEDA @ 1038 ON [id number] BY [facility]     Performed at [facility]   Report Status 08/15/2014 FINAL   Final   Organism ID, Bacteria METHICILLIN RESISTANT STAPHYLOCOCCUS AUREUS   Final  URINE CULTURE     Status: None   Collection Time    08/12/14  6:00 PM      Result Value Ref Range Status   Specimen Description URINE, RANDOM   Final   Special Requests NONE   Final   Culture  Setup Time     Final   Value: 08/12/2014 20:13     Performed at [facility] Count     Final   Value: NO GROWTH     Performed at [facility]   Culture     Final   Value: NO GROWTH     Performed at [facility]   Report Status 08/13/2014 FINAL   Final    Studies/Results: No results found.   Assessment/Plan: Endocarditis of Ao  MRSA bacteremia  ESRD  RLE infection  Diabetic Ulcer  Diabetes mellitus 2  Hx of C diff (02-2014)  Total days of antibiotics: cubicin/zyvox/rifampin  Has repeat BCx + MRSA while on therapy. His MRSA is sensitive to vanco, rifampin.  Will ask lab to plate for sensi to linezolid, cubicin Will continue to follow, await hospice placement?         Johny Sax Infectious Diseases (pager) [phone] [email or web address] 08/16/2014, 4:35 PM  LOS: 5 days

## 2014-08-16 NOTE — Progress Notes (Signed)
  Subjective: Pt with several co morbidities Worsening renal function; stage 5 CKD Required temp HD cath L femoral site--(? Who placed this cath) Morbid obese RLE BKA; DM Was treated for MRSA bacteremia 06/2014 x 4 weeks New BC+ MRSA 08/11/14 + large aortic vegetation on TEE Transferred from [facility] Now on Daptomycin L fem temp cath removed 9/26 after dialysis Request for another temp cath for now- while treating MRSA   Objective: Vital signs in last 24 hours: Temp:  [97.6 F (36.4 C)-99.1 F (37.3 C)] 97.6 F (36.4 C) (09/28 0623) Pulse Rate:  [101-104] 104 (09/28 0623) Resp:  [18] 18 (09/28 0623) BP: (125-129)/(43-52) 125/43 mmHg (09/28 0623) SpO2:  [95 %-96 %] 95 % (09/28 0623) Weight:  [178.128 kg (392 lb 11.2 oz)] 178.128 kg (392 lb 11.2 oz) (09/28 0623) Last BM Date: 08/15/14  Intake/Output from previous day: 09/27 0701 - 09/28 0700 In: 1860 [P.O.:460; IV Piggyback:1400] Out: -  Intake/Output this shift:    PE:  Afeb; vss Morbid obese No HD access now   Lab Results:   Recent Labs  08/15/14 0939 08/16/14 0418  WBC 18.8* 17.7*  HGB 8.3* 8.3*  HCT 25.5* 25.7*  PLT 193 202   BMET  Recent Labs  08/15/14 0939 08/16/14 0418  NA 137 136*  K 4.5 4.6  CL 96 96  CO2 23 21  GLUCOSE 156* 124*  BUN 43* 49*  CREATININE 4.43* 5.11*  CALCIUM 7.9* 8.1*   PT/INR No results found for this basename: LABPROT, INR,  in the last 72 hours ABG No results found for this basename: PHART, PCO2, PO2, HCO3,  in the last 72 hours  Studies/Results: No results found.  Anti-infectives: Anti-infectives   Start     Dose/Rate Route Frequency Ordered Stop   08/15/14 0200  DAPTOmycin (CUBICIN) 1,000 mg in sodium chloride 0.9 % IVPB     1,000 mg 240 mL/hr over 30 Minutes Intravenous Every 48 hours 08/15/14 0113     08/12/14 2300  DAPTOmycin (CUBICIN) 1,000 mg in sodium chloride 0.9 % IVPB  Status:  Discontinued     1,000 mg 240 mL/hr over 30 Minutes  Intravenous Every 48 hours 08/11/14 2038 08/15/14 0112   08/12/14 1515  rifampin (RIFADIN) 300 mg in sodium chloride 0.9 % 100 mL IVPB     300 mg 200 mL/hr over 30 Minutes Intravenous 3 times per day 08/12/14 1449     08/11/14 2200  linezolid (ZYVOX) IVPB 600 mg     600 mg 300 mL/hr over 60 Minutes Intravenous Every 12 hours 08/11/14 1834        Assessment/Plan: s/p * No surgery found * CKD stage 5 Worsening renal fxn +MRSA BC No HD access now (removed L femoral temp cath after 9/26 dialysis) Scheduled for temp cath 9/29 Pt and wife aware of procedure benefits and risks and agreeable to proceed Consent signed andin chart   LOS: 5 days    Angel Mays A 08/16/2014

## 2014-08-16 NOTE — Progress Notes (Signed)
Progress Note from the Palliative Medicine Team at [facility]  Subjective:  -continued conversation regarding GOC, treatment options and anticipatory care needs.  -discussed consensus (medical team) that overall  prognosis is poor and treatment options are limited    -continued conversation regarding natural trajectory at EOL in presence of ESRD without dialysis discussed in detail    Objective: Allergies  Allergen Reactions  . Omnipaque [Iohexol]    Scheduled Meds: . sodium chloride   Intravenous Once  . ARIPiprazole  5 mg Oral Daily  . calcium acetate  667 mg Oral TID WC  . DAPTOmycin (CUBICIN)  IV  1,000 mg Intravenous Q48H  . feeding supplement (GLUCERNA SHAKE)  237 mL Oral TID BM  . feeding supplement (PRO-STAT SUGAR FREE 64)  30 mL Oral BID  . hydrocerin  1 application Topical Daily  . insulin aspart  0-5 Units Subcutaneous QHS  . insulin aspart  0-9 Units Subcutaneous TID WC  . insulin detemir  15 Units Subcutaneous QHS  . linezolid  600 mg Intravenous Q12H  . LORazepam  2 mg Intravenous Once  . rifampin (RIFADIN) IVPB  300 mg Intravenous 3 times per day  . sodium chloride  3 mL Intravenous Q12H   Continuous Infusions:  PRN Meds:.acetaminophen, albuterol, LORazepam, ondansetron (ZOFRAN) IV, oxyCODONE  BP 125/43  Pulse 104  Temp(Src) 97.6 F (36.4 C) (Oral)  Resp 18  Ht  (2.057 m)  Wt 178.128 kg (392 lb 11.2 oz)  BMI 42.10 kg/m2  SpO2 95%   PPS:30 % at best  Pain Score:denies presetnly Pain Location  Right leg   Intake/Output Summary (Last 24 hours) at 08/16/14 1430 Last data filed at 08/16/14 0400  Gross per 24 hour  Intake   1620 ml  Output      0 ml  Net   1620 ml       Physical Exam:  General: Ill appearing, NAD HEENT:  Moist buccal membranes, no exudate Chest:  Diminished in bases, CTA CVS: tachycardic Abdomen: obese, soft +BS Ext: LBKA, RLE with noted skin changes RT Neuro: weak,   Labs: CBC    Component Value Date/Time   WBC  17.7* 08/16/2014 0418   RBC 2.94* 08/16/2014 0418   RBC 2.70* 01/22/2014 2130   HGB 8.3* 08/16/2014 0418   HCT 25.7* 08/16/2014 0418   PLT 202 08/16/2014 0418   MCV 87.4 08/16/2014 0418   MCH 28.2 08/16/2014 0418   MCHC 32.3 08/16/2014 0418   RDW 19.0* 08/16/2014 0418   LYMPHSABS 1.4 08/16/2014 0418   MONOABS 1.5* 08/16/2014 0418   EOSABS 0.1 08/16/2014 0418   BASOSABS 0.1 08/16/2014 0418    BMET    Component Value Date/Time   NA 136* 08/16/2014 0418   K 4.6 08/16/2014 0418   CL 96 08/16/2014 0418   CO2 21 08/16/2014 0418   GLUCOSE 124* 08/16/2014 0418   BUN 49* 08/16/2014 0418   CREATININE 5.11* 08/16/2014 0418   CALCIUM 8.1* 08/16/2014 0418   CALCIUM 8.6 02/03/2014 1915   GFRNONAA 12* 08/16/2014 0418   GFRAA 14* 08/16/2014 0418    CMP     Component Value Date/Time   NA 136* 08/16/2014 0418   K 4.6 08/16/2014 0418   CL 96 08/16/2014 0418   CO2 21 08/16/2014 0418   GLUCOSE 124* 08/16/2014 0418   BUN 49* 08/16/2014 0418   CREATININE 5.11* 08/16/2014 0418   CALCIUM 8.1* 08/16/2014 0418   CALCIUM 8.6 02/03/2014 1915   PROT 6.3 08/12/2014  1205   ALBUMIN 2.2* 08/16/2014 0418   AST 78* 08/12/2014 1205   ALT 52 08/12/2014 1205   ALKPHOS 126* 08/12/2014 1205   BILITOT 1.4* 08/12/2014 1205   GFRNONAA 12* 08/16/2014 0418   GFRAA 14* 08/16/2014 0418    Assessment and Plan: 1. Code Status:Full code 2. Symptom Control:  3. Psycho/Social: Emotional support offered to patient and family (wife and daughter) at bedside.  It is difficult for all to face the realization of poor prognosis and limited medical options 4. Spiritual: chaplain involved 5. Disposition:  Dependant on outcomes, family is discussing possibility of residential hospice closer to where they live in [place] (near [facility]).  Discussed with CM investigation into residential hospice closer to their home.  Patient Documents Completed or Given: Document Given Completed  Advanced Directives Pkt    MOST    DNR discussed   Gone from [facility]     Hard Choices yes     Time In Time Out Total Time Spent with Patient Total Overall Time  1230 1330 60 min 60 min    Greater than 50%  of this time was spent counseling and coordinating care related to the above assessment and plan.  Lorinda Creed NP  Palliative Medicine Team Team Phone # [phone] Pager [phone]  Discussed with Dr Dorris Fetch and Dr Janee Morn 1

## 2014-08-16 NOTE — Progress Notes (Signed)
Subjective: Patient seen at bedside this AM. Awake in bed, appeared somewhat uncomfortable. Did not sleep overnight. Still anuric.   Objective:  Scheduled Meds: . sodium chloride   Intravenous Once  . ARIPiprazole  5 mg Oral Daily  . DAPTOmycin (CUBICIN)  IV  1,000 mg Intravenous Q48H  . feeding supplement (GLUCERNA SHAKE)  237 mL Oral TID BM  . feeding supplement (PRO-STAT SUGAR FREE 64)  30 mL Oral BID  . hydrocerin  1 application Topical Daily  . insulin aspart  0-5 Units Subcutaneous QHS  . insulin aspart  0-9 Units Subcutaneous TID WC  . insulin detemir  15 Units Subcutaneous QHS  . linezolid  600 mg Intravenous Q12H  . LORazepam  2 mg Intravenous Once  . rifampin (RIFADIN) IVPB  300 mg Intravenous 3 times per day  . sodium chloride  3 mL Intravenous Q12H   Continuous Infusions:  PRN Meds:.acetaminophen, albuterol, LORazepam, ondansetron (ZOFRAN) IV, oxyCODONE  BP 125/43  Pulse 104  Temp(Src) 97.6 F (36.4 C) (Oral)  Resp 18  Ht  (2.057 m)  Wt 388 lb 3.7 oz (176.1 kg)  BMI 41.62 kg/m2  SpO2 95%   Intake/Output Summary (Last 24 hours) at 08/16/14 0649 Last data filed at 08/16/14 0400  Gross per 24 hour  Intake   2200 ml  Output      0 ml  Net   2200 ml    Weight change:  08/16/14 0623 392 lb 11.2 oz (178.128 kg) 08/14/14 1520 388 lb 3.7 oz (176.1 kg) 08/14/14 1102 392 lb 13.8 oz (178.2 kg) 08/14/14 0500 392 lb 6.7 oz (178 kg)  08/13/14 2137 390 lb 7 oz (177.1 kg) 08/13/14 1635 394 lb 6.5 oz (178.9 kg) 08/13/14 0502 387 lb 1.6 oz (175.587 kg)  08/11/14 1700 376 lb (170.552 kg)   EXAM:  General: Morbidly obese male, alert, cooperative, NAD. Chronically ill-appearing. Appears mildly tachypneic.  HEENT: PERRL, EOMI. Moist mucus membranes.  Neck: Full range of motion without pain, supple, no lymphadenopathy or carotid bruits Lungs: Mild tachypnea. Air entry equal bilaterally, rales at bases. No wheezes.  Heart: Regular rate and rhythm, 3/6 combined  systolic/diastolic murmur heard, loudest at left upper sternal border. No rubs or gallops.  Abdomen: Obese, mild diffuse abdominal tenderness to palpation. BS present.  Extremities: RLE w/ severe swelling, erythema, and venous stasis changes. Foot w/ large overlying scab/chronic skin changes on dorsum of the foot. Leg w/ large overlying eschar on medial surface. Underlying skin erythematous, edematous. LLE w/ BKA.  Neurologic: Alert & oriented x3, cranial nerves grossly intact, strength grossly intact. Depressed mood, flat affect.    Labs: Basic Metabolic Panel:  Recent Labs Lab 08/11/14 2100 08/12/14 1205  08/14/14 0555 08/15/14 0939 08/16/14 0418  NA 140 142  < > 138 137 136*  K 5.3 5.5*  < > 4.7 4.5 4.6  CL 101 101  < > 97 96 96  CO2 23 23  < > GLUCOSE 176* 211*  < > 106* 156* 124*  BUN 79* 83*  < > 63* 43* 49*  CREATININE 5.51* 6.26*  < > 5.25* 4.43* 5.11*  CALCIUM 8.1* 8.1*  < > 7.9* 7.9* 8.1*  MG 2.1 2.1  --   --   --   --   PHOS 5.1*  --   < > 5.4* 5.2* 6.2*  < > = values in this interval not displayed.  Liver Function Tests:  Recent Labs Lab 08/11/14 2100 08/12/14  1205  08/14/14 0555 08/15/14 0939 08/16/14 0418  AST 90* 78*  --   --   --   --   ALT 44 52  --   --   --   --   ALKPHOS 129* 126*  --   --   --   --   BILITOT 1.3* 1.4*  --   --   --   --   PROT 6.1 6.3  --   --   --   --   ALBUMIN 2.0* 2.1*  < > 2.1* 2.1* 2.2*  < > = values in this interval not displayed.  CBC:  Recent Labs Lab 08/12/14 1012 08/13/14 1635 08/14/14 0555 08/15/14 0939 08/16/14 0418  WBC 22.9* 21.4* 18.0* 18.8* 17.7*  NEUTROABS 20.3* 17.9*  --  15.6* 14.6*  HGB 7.1* 6.8* 8.2* 8.3* 8.3*  HCT 21.6* 20.6* 24.3* 25.5* 25.7*  MCV 84.7 84.4 87.1 87.0 87.4  PLT 236 251 206 193 202    Cardiac Enzymes:  Recent Labs Lab 08/12/14 1649  CKTOTAL 27    CBG:  Recent Labs Lab 08/14/14 2045 08/15/14 0733 08/15/14 1205 08/15/14 1641 08/15/14 2020  GLUCAP 152*  132* 167* 171* 145*     Assessment/Plan: 43 y/o M w/ multiple co-morbidities, most significant for LE chronic venous stasis changes and RLE leg infection, RLE BKA, DM type II, CKD stage 5, transferred from [facility] w/ MRSA bacteremia, found to have large aortic vegetation on TEE. Also w/ worsening renal failure requiring HD via temporary left femoral HD catheter.   CKD stage 5- Patient w/ known history of chronic kidney disease 2/2 poorly controlled DM, and acute worsening most likely d/t severe infection. Baseline Cr relatively unclear, but has previously been ~2.5 earlier this year. Very complex medical issues including IE, severe right LE cellulitis, and MRSA bacteremia. Temporary left femoral HD catheter removed on 08/14/14 after HD. For temporary HD catheter placement and HD on 08/17/14. Cr. 5.11 this AM.  Hyperkalemia- Resolved. 4.6 this AM.  MRSA bacteremia- Had previously been treated for MRSA bacteremia in 06/2014 w/ 4 week course of Vancomycin. Repeat blood cultures from 08/11/14 positive for MRSA. Currently on Linezolid, Daptomycin, and Rifampin.  Aortic Valve Infective Endocarditis- TEE at [facility] from 08/11/14 shows large 1 cm vegetation on the aortic valve. Not surgical candidate. Palliative Care following as well, for further goals of care discussion today.  RLE cellulitis- Antibiotics as above. XR of LE negative for osteo.  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 Normocytic Anemia- Baseline Hb 9. 8.3 this AM.  Hyperphosphatemia- Phos 6.2 this AM, Ca 8.1, Albumin 2.2 (corrected Ca 9.54). Start Phoslo 667 mg po tid.   Lars Masson PGY-2, Internal Medicine  Pager: [phone]

## 2014-08-16 NOTE — Progress Notes (Signed)
Stopped by to see patient and family again  They are in the process of deciding between hospice and home hospice  He is minimally interactive.   Wife understands that we do not believe he is a surgical candidate  Continue medical therapy

## 2014-08-17 ENCOUNTER — Inpatient Hospital Stay (HOSPITAL_COMMUNITY): Payer: Medicare Other

## 2014-08-17 LAB — RENAL FUNCTION PANEL
ANION GAP: 20 — AB (ref 5–15)
Albumin: 2.2 g/dL — ABNORMAL LOW (ref 3.5–5.2)
BUN: 59 mg/dL — AB (ref 6–23)
CHLORIDE: 94 meq/L — AB (ref 96–112)
CO2: 22 mEq/L (ref 19–32)
CREATININE: 6.2 mg/dL — AB (ref 0.50–1.35)
Calcium: 8.1 mg/dL — ABNORMAL LOW (ref 8.4–10.5)
GFR calc Af Amer: 11 mL/min — ABNORMAL LOW (ref >90)
GFR calc non Af Amer: 10 mL/min — ABNORMAL LOW (ref >90)
Glucose, Bld: 144 mg/dL — ABNORMAL HIGH (ref 70–99)
PHOSPHORUS: 8.1 mg/dL — AB (ref 2.3–4.6)
Potassium: 4.6 mEq/L (ref 3.7–5.3)
Sodium: 136 mEq/L — ABNORMAL LOW (ref 137–147)

## 2014-08-17 LAB — CBC
HEMATOCRIT: 27.4 % — AB (ref 39.0–52.0)
HEMOGLOBIN: 8.9 g/dL — AB (ref 13.0–17.0)
MCH: 29.4 pg (ref 26.0–34.0)
MCHC: 32.5 g/dL (ref 30.0–36.0)
MCV: 90.4 fL (ref 78.0–100.0)
Platelets: 171 10*3/uL (ref 150–400)
RBC: 3.03 MIL/uL — ABNORMAL LOW (ref 4.22–5.81)
RDW: 19.9 % — ABNORMAL HIGH (ref 11.5–15.5)
WBC: 15.4 10*3/uL — ABNORMAL HIGH (ref 4.0–10.5)

## 2014-08-17 LAB — GLUCOSE, CAPILLARY
GLUCOSE-CAPILLARY: 96 mg/dL (ref 70–99)
Glucose-Capillary: 134 mg/dL — ABNORMAL HIGH (ref 70–99)
Glucose-Capillary: 136 mg/dL — ABNORMAL HIGH (ref 70–99)
Glucose-Capillary: 120 mg/dL — ABNORMAL HIGH (ref 70–99)

## 2014-08-17 LAB — PROT IMMUNOELECTROPHORES(ARMC)

## 2014-08-17 MED ORDER — CAMPHOR-MENTHOL 0.5-0.5 % EX LOTN
1.0000 "application " | TOPICAL_LOTION | Freq: Three times a day (TID) | CUTANEOUS | Status: DC | PRN
  Filled 2014-08-17: qty 222

## 2014-08-17 MED ORDER — ACETAMINOPHEN 325 MG PO TABS: 650.0000 mg | ORAL_TABLET | Freq: Four times a day (QID) | ORAL | Status: DC | PRN

## 2014-08-17 MED ORDER — ZOLPIDEM TARTRATE 5 MG PO TABS
10.0000 mg | ORAL_TABLET | Freq: Every evening | ORAL | Status: DC | PRN
  Administered 2014-08-17 – 2014-08-19 (×3): 10 mg via ORAL
  Filled 2014-08-17 (×3): qty 2

## 2014-08-17 MED ORDER — NEPRO/CARBSTEADY PO LIQD
237.0000 mL | Freq: Three times a day (TID) | ORAL | Status: DC | PRN
  Filled 2014-08-17: qty 237

## 2014-08-17 MED ORDER — PENTAFLUOROPROP-TETRAFLUOROETH EX AERO: 1.0000 "application " | INHALATION_SPRAY | CUTANEOUS | Status: DC | PRN

## 2014-08-17 MED ORDER — SORBITOL 70 % SOLN
30.0000 mL | Status: DC | PRN
  Filled 2014-08-17: qty 30

## 2014-08-17 MED ORDER — ALTEPLASE 2 MG IJ SOLR
2.0000 mg | Freq: Once | INTRAMUSCULAR | Status: DC | PRN
  Filled 2014-08-17: qty 2

## 2014-08-17 MED ORDER — DOCUSATE SODIUM 283 MG RE ENEM
1.0000 | ENEMA | RECTAL | Status: DC | PRN
  Filled 2014-08-17: qty 1

## 2014-08-17 MED ORDER — HEPARIN SODIUM (PORCINE) 1000 UNIT/ML IJ SOLN
INTRAMUSCULAR | Status: AC
  Filled 2014-08-17: qty 1

## 2014-08-17 MED ORDER — ONDANSETRON HCL 4 MG/2ML IJ SOLN: 4.0000 mg | Freq: Four times a day (QID) | INTRAMUSCULAR | Status: DC | PRN

## 2014-08-17 MED ORDER — LIDOCAINE HCL 1 % IJ SOLN
INTRAMUSCULAR | Status: AC
  Filled 2014-08-17: qty 20

## 2014-08-17 MED ORDER — LIDOCAINE-PRILOCAINE 2.5-2.5 % EX CREA
1.0000 "application " | TOPICAL_CREAM | CUTANEOUS | Status: DC | PRN
  Filled 2014-08-17: qty 5

## 2014-08-17 MED ORDER — ONDANSETRON HCL 4 MG PO TABS: 4.0000 mg | ORAL_TABLET | Freq: Four times a day (QID) | ORAL | Status: DC | PRN

## 2014-08-17 MED ORDER — ZOLPIDEM TARTRATE 5 MG PO TABS: 5.0000 mg | ORAL_TABLET | Freq: Every evening | ORAL | Status: DC | PRN

## 2014-08-17 MED ORDER — NEPRO/CARBSTEADY PO LIQD
237.0000 mL | ORAL | Status: DC | PRN
  Filled 2014-08-17: qty 237

## 2014-08-17 MED ORDER — CALCIUM CARBONATE 1250 MG/5ML PO SUSP
500.0000 mg | Freq: Four times a day (QID) | ORAL | Status: DC | PRN
  Filled 2014-08-17: qty 5

## 2014-08-17 MED ORDER — HYDROXYZINE HCL 25 MG PO TABS: 25.0000 mg | ORAL_TABLET | Freq: Three times a day (TID) | ORAL | Status: DC | PRN

## 2014-08-17 MED ORDER — ACETAMINOPHEN 650 MG RE SUPP: 650.0000 mg | Freq: Four times a day (QID) | RECTAL | Status: DC | PRN

## 2014-08-17 MED ORDER — SODIUM CHLORIDE 0.9 % IJ SOLN
10.0000 mL | INTRAMUSCULAR | Status: DC | PRN
  Administered 2014-08-17 – 2014-08-19 (×2): 10 mL

## 2014-08-17 MED ORDER — SODIUM CHLORIDE 0.9 % IV SOLN
100.0000 mL | INTRAVENOUS | Status: DC | PRN
Start: 2014-08-17 — End: 2014-08-17

## 2014-08-17 MED ORDER — SODIUM CHLORIDE 0.9 % IV SOLN: 100.0000 mL | INTRAVENOUS | Status: DC | PRN

## 2014-08-17 MED ORDER — LIDOCAINE HCL (PF) 1 % IJ SOLN: 5.0000 mL | INTRAMUSCULAR | Status: DC | PRN

## 2014-08-17 MED ORDER — HEPARIN SODIUM (PORCINE) 1000 UNIT/ML DIALYSIS: 1000.0000 [IU] | INTRAMUSCULAR | Status: DC | PRN

## 2014-08-17 NOTE — Progress Notes (Signed)
I have seen and examined this patient and agree with the plan of care   Anwar Sakata W 08/17/2014, 8:06 PM  

## 2014-08-17 NOTE — Progress Notes (Signed)
INFECTIOUS DISEASE PROGRESS NOTE  ID: Angel Mays is a 43 y.o. male with  Principal Problem:   Endocarditis of aortic valve Active Problems:   Acute encephalopathy   CKD (chronic kidney disease)   Leg ulcer   Diabetes mellitus   HTN (hypertension)   History of DVT (deep vein thrombosis)   Infectious aortic insufficiency   Bacteremia   DNR (do not resuscitate) discussion   Weakness generalized   Palliative care encounter  Subjective: Feels tired.   Abtx:  Anti-infectives   Start     Dose/Rate Route Frequency Ordered Stop   08/15/14 0200  DAPTOmycin (CUBICIN) 1,000 mg in sodium chloride 0.9 % IVPB     1,000 mg 240 mL/hr over 30 Minutes Intravenous Every 48 hours 08/15/14 0113     08/12/14 2300  DAPTOmycin (CUBICIN) 1,000 mg in sodium chloride 0.9 % IVPB  Status:  Discontinued     1,000 mg 240 mL/hr over 30 Minutes Intravenous Every 48 hours 08/11/14 2038 08/15/14 0112   08/12/14 1515  rifampin (RIFADIN) 300 mg in sodium chloride 0.9 % 100 mL IVPB     300 mg 200 mL/hr over 30 Minutes Intravenous 3 times per day 08/12/14 1449     08/11/14 2200  linezolid (ZYVOX) IVPB 600 mg     600 mg 300 mL/hr over 60 Minutes Intravenous Every 12 hours 08/11/14 1834        Medications:  Scheduled: . sodium chloride   Intravenous Once  . ARIPiprazole  5 mg Oral Daily  . calcium acetate  667 mg Oral TID WC  . DAPTOmycin (CUBICIN)  IV  1,000 mg Intravenous Q48H  . feeding supplement (GLUCERNA SHAKE)  237 mL Oral TID BM  . feeding supplement (PRO-STAT SUGAR FREE 64)  30 mL Oral BID  . heparin      . hydrocerin  1 application Topical Daily  . insulin aspart  0-5 Units Subcutaneous QHS  . insulin aspart  0-9 Units Subcutaneous TID WC  . insulin detemir  15 Units Subcutaneous QHS  . lidocaine      . linezolid  600 mg Intravenous Q12H  . LORazepam  2 mg Intravenous Once  . rifampin (RIFADIN) IVPB  300 mg Intravenous 3 times per day  . sodium chloride  3 mL Intravenous Q12H     Objective: Vital signs in last 24 hours: Temp:  [98.6 F (37 C)-98.7 F (37.1 C)] 98.6 F (37 C) (09/29 0527) Pulse Rate:  [99-102] 99 (09/29 0527) Resp:  [18] 18 (09/29 0527) BP: (116-139)/(52-55) 139/55 mmHg (09/29 0527) SpO2:  [97 %-100 %] 100 % (09/29 0527) Weight:  [181.983 kg (401 lb 3.2 oz)] 181.983 kg (401 lb 3.2 oz) (09/29 0527)   General appearance: alert, cooperative and no distress Resp: clear to auscultation bilaterally Cardio: regular rate and rhythm GI: normal findings: bowel sounds normal and soft, non-tender Extremities: RLE unchanged. posterior wounds/eschar well healing  Lab Results  Recent Labs  08/15/14 0939 08/16/14 0418 08/17/14 1051  WBC 18.8* 17.7* 15.4*  HGB 8.3* 8.3* 8.9*  HCT 25.5* 25.7* 27.4*  NA 137 136*  --   K 4.5 4.6  --   CL 96 96  --   CO2 23 21  --   BUN 43* 49*  --   CREATININE 4.43* 5.11*  --    Liver Panel  Recent Labs  08/15/14 0939 08/16/14 0418  ALBUMIN 2.1* 2.2*   Sedimentation Rate No results found for this basename: ESRSEDRATE,  in  the last 72 hours C-Reactive Protein No results found for this basename: CRP,  in the last 72 hours  Microbiology: Recent Results (from the past 240 hour(s))  CULTURE, BLOOD (ROUTINE X 2)     Status: None   Collection Time    08/11/14  8:43 PM      Result Value Ref Range Status   Specimen Description BLOOD LEFT HAND   Final   Special Requests BOTTLES DRAWN AEROBIC AND ANAEROBIC 10CC EACH   Final   Culture  Setup Time     Final   Value: 08/12/2014 03:41     Performed at [facility]   Culture     Final   Value: GRAM POSITIVE COCCI IN CLUSTERS     Note: Culture results may be compromised due to an excessive volume of blood received in culture bottles. Gram Stain Report Called to,Read Back By and Verified With: Jannette Spanner RN ON 09.25.15 AT 1938 BY HENDJ     Performed at [facility]   Report Status 08/15/2014 FINAL   Final  CULTURE, BLOOD (ROUTINE X 2)     Status:  None   Collection Time    08/11/14  9:00 PM      Result Value Ref Range Status   Specimen Description BLOOD LEFT HAND   Final   Special Requests BOTTLES DRAWN AEROBIC ONLY 5CC   Final   Culture  Setup Time     Final   Value: 08/12/2014 03:38     Performed at [facility]   Culture     Final   Value: METHICILLIN RESISTANT STAPHYLOCOCCUS AUREUS     Note: RIFAMPIN AND GENTAMICIN SHOULD NOT BE USED AS SINGLE DRUGS FOR TREATMENT OF STAPH INFECTIONS. CRITICAL RESULT CALLED TO, READ BACK BY AND VERIFIED WITH: JANET [facility] 08/15/14 @ 10:10AM BY RUSCOE A.     Note: Gram Stain Report Called to,Read Back By and Verified With: ELENA MOSQUEDA @ 1038 ON [id number] BY [facility]     Performed at [facility]   Report Status PENDING   Incomplete   Organism ID, Bacteria METHICILLIN RESISTANT STAPHYLOCOCCUS AUREUS   Final  URINE CULTURE     Status: None   Collection Time    08/12/14  6:00 PM      Result Value Ref Range Status   Specimen Description URINE, RANDOM   Final   Special Requests NONE   Final   Culture  Setup Time     Final   Value: 08/12/2014 20:13     Performed at [facility] Count     Final   Value: NO GROWTH     Performed at [facility]   Culture     Final   Value: NO GROWTH     Performed at [facility]   Report Status 08/13/2014 FINAL   Final    Studies/Results: No results found.   Assessment/Plan: Endocarditis of Ao  MRSA bacteremia  ESRD  RLE infection  Diabetic Ulcer  Diabetes mellitus 2  Hx of C diff (02-2014)   Total days of antibiotics: cubicin/zyvox/rifampin  Will repeat BCx His isolate is sensitive to linezolid dapto sensi pending HD line placed today, hopefully not still bacteremic Hospice placement?          Johny Sax Infectious Diseases (pager) [phone] [email or web address] 08/17/2014, 11:15 AM  LOS: 6 days

## 2014-08-17 NOTE — Progress Notes (Signed)
Subjective: Patient still feeing tired, no significant complaints. Family still having difficulty making decisions w/ regards to care.  RIJ temporary HD catheter placed this AM, will get HD this afternoon.  Anuric. Cr 6.20 this AM.   Objective:  Scheduled Meds: . sodium chloride   Intravenous Once  . ARIPiprazole  5 mg Oral Daily  . calcium acetate  667 mg Oral TID WC  . DAPTOmycin (CUBICIN)  IV  1,000 mg Intravenous Q48H  . feeding supplement (GLUCERNA SHAKE)  237 mL Oral TID BM  . feeding supplement (PRO-STAT SUGAR FREE 64)  30 mL Oral BID  . heparin      . hydrocerin  1 application Topical Daily  . insulin aspart  0-5 Units Subcutaneous QHS  . insulin aspart  0-9 Units Subcutaneous TID WC  . insulin detemir  15 Units Subcutaneous QHS  . lidocaine      . linezolid  600 mg Intravenous Q12H  . LORazepam  2 mg Intravenous Once  . rifampin (RIFADIN) IVPB  300 mg Intravenous 3 times per day  . sodium chloride  3 mL Intravenous Q12H   Continuous Infusions:  PRN Meds:.acetaminophen, albuterol, LORazepam, ondansetron (ZOFRAN) IV, oxyCODONE  BP 139/55  Pulse 99  Temp(Src) 98.6 F (37 C) (Oral)  Resp 18  Ht 6\' 9"  (2.057 m)  Wt 401 lb 3.2 oz (181.983 kg)  BMI 43.01 kg/m2  SpO2 100%  No intake or output data in the 24 hours ending 08/17/14 1147  Weight change: 8 lb 8 oz (3.856 kg) 08/17/14 0527 401 lb 3.2 oz (181.983 kg) 08/16/14 0623 392 lb 11.2 oz (178.128 kg) 08/14/14 1520 388 lb 3.7 oz (176.1 kg) 08/14/14 1102 392 lb 13.8 oz (178.2 kg) 08/14/14 0500 392 lb 6.7 oz (178 kg)  08/13/14 2137 390 lb 7 oz (177.1 kg) 08/13/14 1635 394 lb 6.5 oz (178.9 kg) 08/13/14 0502 387 lb 1.6 oz (175.587 kg)  08/11/14 1700 376 lb (170.552 kg)   EXAM:  General: Morbidly obese male, alert, cooperative, NAD. Chronically ill-appearing.  HEENT: PERRL, EOMI. Moist mucus membranes.  Neck: Full range of motion without pain, supple, no lymphadenopathy or carotid bruits. RIJ HD catheter in place.   Lungs: Mild tachypnea. Air entry equal bilaterally, rales at bases. No wheezes.  Heart: Regular rate and rhythm, 3/6 combined systolic/diastolic murmur heard, loudest at left upper sternal border. No rubs or gallops.  Abdomen: Obese, non-tender, non-distended. BS present.  Extremities: RLE w/ severe swelling, erythema, and venous stasis changes. Foot w/ large overlying scab/chronic skin changes on dorsum of the foot. Leg w/ large overlying eschar on medial surface. Underlying skin erythematous, edematous. LLE w/ BKA. LE edema worsened from yesterday. Neurologic: Alert & oriented x3, cranial nerves grossly intact, strength grossly intact. Depressed mood, flat affect.    Labs: Basic Metabolic Panel:  Recent Labs Lab 08/11/14 2100 08/12/14 1205  08/15/14 0939 08/16/14 0418 08/17/14 1051  NA 140 142  < > 137 136* 136*  K 5.3 5.5*  < > 4.5 4.6 4.6  CL 101 101  < > 96 96 94*  CO2 23 23  < > 23 21 22   GLUCOSE 176* 211*  < > 156* 124* 144*  BUN 79* 83*  < > 43* 49* 59*  CREATININE 5.51* 6.26*  < > 4.43* 5.11* 6.20*  CALCIUM 8.1* 8.1*  < > 7.9* 8.1* 8.1*  MG 2.1 2.1  --   --   --   --   PHOS 5.1*  --   < >  5.2* 6.2* 8.1*  < > = values in this interval not displayed.  Liver Function Tests:  Recent Labs Lab 08/11/14 2100 08/12/14 1205  08/15/14 0939 08/16/14 0418 08/17/14 1051  AST 90* 78*  --   --   --   --   ALT 44 52  --   --   --   --   ALKPHOS 129* 126*  --   --   --   --   BILITOT 1.3* 1.4*  --   --   --   --   PROT 6.1 6.3  --   --   --   --   ALBUMIN 2.0* 2.1*  < > 2.1* 2.2* 2.2*  < > = values in this interval not displayed.  CBC:  Recent Labs Lab 08/13/14 1635 08/14/14 0555 08/15/14 0939 08/16/14 0418 08/17/14 1051  WBC 21.4* 18.0* 18.8* 17.7* 15.4*  NEUTROABS 17.9*  --  15.6* 14.6*  --   HGB 6.8* 8.2* 8.3* 8.3* 8.9*  HCT 20.6* 24.3* 25.5* 25.7* 27.4*  MCV 84.4 87.1 87.0 87.4 90.4  PLT 251 206 193 202 171    Cardiac Enzymes:  Recent Labs Lab  08/12/14 1649  CKTOTAL 27    CBG:  Recent Labs Lab 08/16/14 1153 08/16/14 1631 08/16/14 2021 08/17/14 0719 08/17/14 1119  GLUCAP 175* 136* 141* 134* 136*     Assessment/Plan: 43 y/o M w/ multiple co-morbidities, most significant for LE chronic venous stasis changes and RLE leg infection, RLE BKA, DM type II, CKD stage 5, transferred from [facility] w/ MRSA bacteremia, found to have large aortic vegetation on TEE. Also w/ worsening renal failure requiring HD via temporary left femoral HD catheter.   CKD stage 5- Patient w/ known history of chronic kidney disease 2/2 poorly controlled DM, and acute worsening most likely d/t severe infection. Baseline Cr relatively unclear, but has previously been ~2.5 earlier this year. Very complex medical issues including IE, severe right LE cellulitis, and MRSA bacteremia. RIJ temporary HD catheter placed this AM, for HD this afternoon.  Hyperkalemia- Resolved. 4.6 this AM.  MRSA bacteremia- Had previously been treated for MRSA bacteremia in 06/2014 w/ 4 week course of Vancomycin. Repeat blood cultures from 08/11/14 positive for MRSA. Currently on Linezolid, Daptomycin, and Rifampin.  Aortic Valve Infective Endocarditis- TEE at [place] from 08/11/14 shows large 1 cm vegetation on the aortic valve. Not surgical candidate. Palliative Care following as well. RLE cellulitis- Antibiotics as above. XR of LE negative for osteo. Wife wishes to have ortho consult for discussion of possible amputation. Understands that this may not be realistic.  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 Normocytic Anemia- Baseline Hb 9. 8.9 this AM.  Hyperphosphatemia- Phos 8.1 this AM, Ca 8.1, Albumin 2.2 (corrected Ca 9.54). Continue Phoslo 667 mg po tid (started on 08/16/14).   Disposition- Most complicated component regarding this patient. Prognosis is very poor given severity of illnesses. Family states that they still wish to have everything done w/ regards to  his care, however, feel that wife is willing to have discussion w/ respect to realistic outcomes. W/ respect to the patient's renal issues, we will continue to perform HD as needed given that the patient continues to have no urine output. Given bacteremia, patient is not currently a candidate for long term access placement.    Lars Masson PGY-2, Internal Medicine  Pager: [phone]

## 2014-08-17 NOTE — Progress Notes (Signed)
[facility] PROGRESS NOTE  Angel Mays ZOX:[id number] DOB: 1971-08-21 DOA: 08/11/2014 PCP: Abner Greenspan, MD  Assessment/Plan: #1 aortic valvular endocarditis/aortic valvular insufficiency Questionable etiology. Infection may be secondary to ulcer on the lower extremity which may have seeded. Patient has been seen by cardiology and cardiothoracic surgery and patient deemed not operative candidate. MRI head pending as per wife patient with memory issues. MRI was attempted and patient given about 4 mg of IV Ativan however unable to be done. Patient at this point in time is refusing MRI under sedation and as such cancelled MRI. Continue empiric IV Cubicin and IV Zyvox and rifampin for now. CVTS, ID, cardiology following and appreciate input and recommendations. Per cardiology and cardiothoracic surgery patient has a poor prognosis. Palliative care consult pending for goals of care. Family wanted aggressive measures.  #2 MRSA bacteremia Repeat Blood cx with MRSA. Continue empiric IV Cubicin and IV Zyvox, and Rifampin. ID following and appreciate input and recommendations.  #3 right lower extremity wound/infection Continue current IV antibiotics. Patient has been seen by wound care, and recommendations made. X-rays of the right lower extremity negative for osteomyelitis. Will consult with orthopedics for further evaluation. Family wanted aggressive measures.?? Of whether endocarditis seeding may be secondary to lower extremity wound. Orthopedics consultation.  #4 well controlled DM2 CBGs ranged from 134-141. Continue home regimen of long-acting insulin. Sliding scale insulin.  #5 chronic kidney disease stage IV/5 Per wife patient has been temporarily on hemodialysis while in the hospital at [facility] which was started 08/09/2014. Patient with poor urinary output. Patient has been seen by renal and patient underwent HD x 2. Femoral HD catheter removed. Right IJ catheter has been placed for  hemodialysis. Per renal.   #6 anemia The admission H&P patient is status post 5 units packed red blood cells at [place]. Follow H&H. Hemoglobin currently at 8.9. Transfusion threshhold Hgb <7.   #7 history of DVT Patient was on Elliquis, however this was felt secondary to anemia and chronic kidney disease. Patient is status post IVC filter. ?? Infected, remove IVC, defer to ID.  #8 prophylaxis PPI for GI prophylaxis. SCDs for DVT prophylaxis.  #9 prognosis Patient with a very poor prognosis. Patient is presenting with aortic valvular endocarditis, MRSA bacteremia, chronic kidney disease stage IV/5 currently on HD started 08/09/14 at [facility], right lower extremity wound infection, type II diabetic, morbid obesity, chronic lower extremity venostasis and also noted to be anemic. Patient deemed not a surgical candidate. Family want aggressive measures. Palliative care for goals of care pending.  Code Status: Full Family Communication: Updated patient and wife and children at bedside. Disposition Plan: Home versus SNF pending palliative eval and when medically stable.   Consultants:  ID: Dr. Ninetta Lights 08/12/2014  Nephrology: Dr.Powell 08/12/2014  Cardiothoracic surgery Dr. Dorris Fetch 08/11/2014  Cardiology: Dr. Rennis Golden 08/11/2014  Procedures:  Chest x-ray 08/12/2014  TEE 08/11/2014  X-ray of the right lower extremity 08/13/2014  Right IJ trialysis catheter placement 08/17/2049  Antibiotics: Total days of antibiotics day 8  IV daptomycin 08/12/2014  IV Zyvox 08/11/2014  Oral rifampin 08/12/2014  HPI/Subjective: Patient denies any chest pain. Patient denies any shortness of breath. Flat affect. Family have decided they want aggressive care and to keep fighting.  Objective: Filed Vitals:   08/17/14 0527  BP: 139/55  Pulse: 99  Temp: 98.6 F (37 C)  Resp: 18   No intake or output data in the 24 hours ending 08/17/14 1046 Filed Weights   08/14/14 1520 08/16/14 8119  08/17/14 1478  Weight: 176.1 kg (388 lb 3.7 oz) 178.128 kg (392 lb 11.2 oz) 181.983 kg (401 lb 3.2 oz)    Exam:   General:  NAD  Cardiovascular: RRR  Respiratory: CTAB  Abdomen: Soft, nontender, nondistended, positive bowel sounds.  Musculoskeletal: No clubbing cyanosis or edema. Right leg with chronic venous stasis changes and ulcer.  Data Reviewed: Basic Metabolic Panel:  Recent Labs Lab 08/11/14 2100 08/12/14 1205 08/13/14 1635 08/14/14 0555 08/15/14 0939 08/16/14 0418  NA 140 142 135* 138 137 136*  K 5.3 5.5* 5.4* 4.7 4.5 4.6  CL 101 101 98 97 96 96  CO2 23 23 22 23 23 21   GLUCOSE 176* 211* 156* 106* 156* 124*  BUN 79* 83* 86* 63* 43* 49*  CREATININE 5.51* 6.26* 6.48* 5.25* 4.43* 5.11*  CALCIUM 8.1* 8.1* 8.0* 7.9* 7.9* 8.1*  MG 2.1 2.1  --   --   --   --   PHOS 5.1*  --  5.8* 5.4* 5.2* 6.2*   Liver Function Tests:  Recent Labs Lab 08/11/14 2100 08/12/14 1205 08/13/14 1635 08/14/14 0555 08/15/14 0939 08/16/14 0418  AST 90* 78*  --   --   --   --   ALT 44 52  --   --   --   --   ALKPHOS 129* 126*  --   --   --   --   BILITOT 1.3* 1.4*  --   --   --   --   PROT 6.1 6.3  --   --   --   --   ALBUMIN 2.0* 2.1* 2.1* 2.1* 2.1* 2.2*   No results found for this basename: LIPASE, AMYLASE,  in the last 168 hours No results found for this basename: AMMONIA,  in the last 168 hours CBC:  Recent Labs Lab 08/11/14 2222 08/12/14 1012 08/13/14 1635 08/14/14 0555 08/15/14 0939 08/16/14 0418  WBC 21.5* 22.9* 21.4* 18.0* 18.8* 17.7*  NEUTROABS 18.5* 20.3* 17.9*  --  15.6* 14.6*  HGB 7.1* 7.1* 6.8* 8.2* 8.3* 8.3*  HCT 20.7* 21.6* 20.6* 24.3* 25.5* 25.7*  MCV 83.5 84.7 84.4 87.1 87.0 87.4  PLT 223 236 251 206 193 202   Cardiac Enzymes:  Recent Labs Lab 08/12/14 1649  CKTOTAL 27   BNP (last 3 results) No results found for this basename: PROBNP,  in the last 8760 hours CBG:  Recent Labs Lab 08/16/14 0735 08/16/14 1153 08/16/14 1631 08/16/14 2021  08/17/14 0719  GLUCAP 127* 175* 136* 141* 134*    Recent Results (from the past 240 hour(s))  CULTURE, BLOOD (ROUTINE X 2)     Status: None   Collection Time    08/11/14  8:43 PM      Result Value Ref Range Status   Specimen Description BLOOD LEFT HAND   Final   Special Requests BOTTLES DRAWN AEROBIC AND ANAEROBIC 10CC EACH   Final   Culture  Setup Time     Final   Value: 08/12/2014 03:41     Performed at [facility]   Culture     Final   Value: GRAM POSITIVE COCCI IN CLUSTERS     Note: Culture results may be compromised due to an excessive volume of blood received in culture bottles. Gram Stain Report Called to,Read Back By and Verified With: Jannette Spanner RN ON 09.25.15 AT 1938 BY HENDJ     Performed at [facility]   Report Status 08/15/2014 FINAL   Final  CULTURE, BLOOD (ROUTINE  X 2)     Status: None   Collection Time    08/11/14  9:00 PM      Result Value Ref Range Status   Specimen Description BLOOD LEFT HAND   Final   Special Requests BOTTLES DRAWN AEROBIC ONLY 5CC   Final   Culture  Setup Time     Final   Value: 08/12/2014 03:38     [facility]   Culture     Final   Value: METHICILLIN RESISTANT STAPHYLOCOCCUS AUREUS     Note: RIFAMPIN AND GENTAMICIN SHOULD NOT BE USED AS SINGLE DRUGS FOR TREATMENT OF STAPH INFECTIONS. CRITICAL RESULT CALLED TO, READ BACK BY AND VERIFIED WITH: JANET [facility]WORRELL 08/15/14 @ 10:10AM BY RUSCOE A.     Note: Gram Stain Report Called to,Read Back By and Verified With: ELENA MOSQUEDA @ 1038 ON [id number] BY [facility]     [facility]formed at [facility]   Report Status PENDING   Incomplete   Organism ID, Bacteria METHICILLIN RESISTANT STAPHYLOCOCCUS AUREUS   Final  URINE CULTURE     Status: None   Collection Time    08/12/14  6:00 PM      Result Value Ref Range Status   Specimen Description URINE, RANDOM   Final   Special Requests NONE   Final   Culture  Setup Time     Final   Value: 08/12/2014 20:13     Performed at  [facility]   Colony Count     Final   Value: NO GROWTH     Performed at [facility]   Culture     Final   Value: NO GROWTH     Performed at [facility]   Report Status 08/13/2014 FINAL   Final     Studies: No results found.  Scheduled Meds: . sodium chloride   Intravenous Once  . ARIPiprazole  5 mg Oral Daily  . calcium acetate  667 mg Oral TID WC  . DAPTOmycin (CUBICIN)  IV  1,000 mg Intravenous Q48H  . feeding supplement (GLUCERNA SHAKE)  237 mL Oral TID BM  . feeding supplement (PRO-STAT SUGAR FREE 64)  30 mL Oral BID  . heparin      . hydrocerin  1 application Topical Daily  . insulin aspart  0-5 Units Subcutaneous QHS  . insulin aspart  0-9 Units Subcutaneous TID WC  . insulin detemir  15 Units Subcutaneous QHS  . lidocaine      . linezolid  600 mg Intravenous Q12H  . LORazepam  2 mg Intravenous Once  . rifampin (RIFADIN) IVPB  300 mg Intravenous 3 times per day  . sodium chloride  3 mL Intravenous Q12H   Continuous Infusions:   Principal Problem:   Endocarditis of aortic valve Active Problems:   Infectious aortic insufficiency   Acute encephalopathy   CKD (chronic kidney disease)   Leg ulcer   Diabetes mellitus   HTN (hypertension)   History of DVT (deep vein thrombosis)   Bacteremia   DNR (do not resuscitate) discussion   Weakness generalized   Palliative care encounter    Time spent: 35 minutes    [facility] Presbyterian Medical CenterHOMPSON,DANIEL MD [facility] Pager [phone]. If 7PM-7AM, please contact night-coverage at [email or web address], password [email or web address] 08/17/2014, 10:46 AM  LOS: 6 days

## 2014-08-17 NOTE — Procedures (Signed)
R IJ Trialysis placement No complication No blood loss. [facility]. \

## 2014-08-17 NOTE — Progress Notes (Signed)
       Patient Name: Angel Mays Date of Encounter: 08/17/2014    SUBJECTIVE:On my first exposure to the patient, the wife and daughter state that after conference with Hospice/Palliative Care, they have decided to keep fighting and to provide aggressive care. They aske my opinion, which I explained would not be helpful since I am new on the case. He has no dyspnea or chest pain. He is not speaking spontaneously. He is awake.  TELEMETRY:  NSR/ST Filed Vitals:   08/15/14 2037 08/16/14 0623 08/16/14 2017 08/17/14 0527  BP: 129/49 125/43 116/52 139/55  Pulse: 104 104 102 99  Temp: 99.1 F (37.3 C) 97.6 F (36.4 C) 98.7 F (37.1 C) 98.6 F (37 C)  TempSrc:   Oral Oral  Resp: 18 18 18 18   Height:      Weight:  392 lb 11.2 oz (178.128 kg)  401 lb 3.2 oz (181.983 kg)  SpO2: 96% 95% 97% 100%   No intake or output data in the 24 hours ending 08/17/14 1154 LABS: Basic Metabolic Panel:  Recent Labs  82/95/6209/28/15 0418 08/17/14 1051  NA 136* 136*  K 4.6 4.6  CL 96 94*  CO2 21 22  GLUCOSE 124* 144*  BUN 49* 59*  CREATININE 5.11* 6.20*  CALCIUM 8.1* 8.1*  PHOS 6.2* 8.1*   CBC:  Recent Labs  08/15/14 0939 08/16/14 0418 08/17/14 1051  WBC 18.8* 17.7* 15.4*  NEUTROABS 15.6* 14.6*  --   HGB 8.3* 8.3* 8.9*  HCT 25.5* 25.7* 27.4*  MCV 87.0 87.4 90.4  PLT 193 202 171     Radiology/Studies:  No new data  Physical Exam: Blood pressure 139/55, pulse 99, temperature 98.6 F (37 C), temperature source Oral, resp. rate 18, height 6\' 9"  (2.057 m), weight 401 lb 3.2 oz (181.983 kg), SpO2 100.00%. Weight change: 8 lb 8 oz (3.856 kg)  Wt Readings from Last 3 Encounters:  08/17/14 401 lb 3.2 oz (181.983 kg)  01/26/14 414 lb 7.4 oz (188 kg)  01/11/14 490 lb (222.263 kg)   Chest is clear anteriorly 2/6 systolic murmur and 2//6 diastolic murmur related to aortic valve dysfunction from endocarditis. LLE is amputated below the knee. RLE is cool, discolored, and with skin  changes. Neuro is significnat for lethargy.  ASSESSMENT:  1. Aortic valve endocarditis with probable cerebral emboli 2. Persistent bacteremia, likely related to infected right lower extremity. 3. No clinical evidence decompensated CHF currently. 4. A/C kidney disease with intermittent dialysis required. 5. Elevated troponin  Plan:  Is not a candidate for Valve surgery due to co-morbidities and presence of persistent source of bacteremia, i.e. right lower extremity. Family now wants aggressive therapy. The starting point may be right lower extremity amputation. Prolonged antibiotic therapy, and hope to cure valve with antibiotic therapy. All consultants need to discuss this with the family since they are now pushing back about palliative approach.  Extended visit due to chart review and family requirements.  Selinda EonSigned, SMITH III,HENRY W 08/17/2014, 11:54 AM

## 2014-08-17 NOTE — Consult Note (Signed)
Reason for Consult: right leg ulcers Referring Physician: Dr Frederich Balding Angel Mays is an 43 y.o. male.  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  Wife present and asks "we want to know if this leg is amputated will it help heal his infected heart valve so he can have surgery for a new heart valve.".  Ulcers have been present for "months".  Treated at previous facility with dressings.  Pt seen here by [facility] nurse and chronic skin changes of the entire right LE treated with Eucerin cream.  Last order for wound care of the ulcers was to apply small amount of silver cell to wounds and cover.  Pts wife reports the wounds appear better but are still painful.  Pt does not participate very much during the exam.   Radiographs of the right tibia and fibula without signs of osteomyelitis. Wife reports that although hospice has been consulted "they" have decided to fight as long as they can.  Past Medical History  Diagnosis Date  . Normocytic anemia   . Diabetes mellitus with complication   . GERD (gastroesophageal reflux disease)   . Hypertension   . Venous stasis ulcers     a. a. 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 RLE with recurrent cellulitis.  . Morbid obesity   . CKD (chronic kidney disease), stage III   . Gout   . Sepsis     a. 06/2014 MRSA Sepsis->4 wks of Vanc;  b. 06/2014 Echo: EF 55-60%, mild conc LVH, mod dil LA/RA, mild Ao sclerosis w/o stenosis, no veg/mass.  . Peripheral neuropathy   . Depression   . Cataract   . Seasonal allergies   . Abdominal abscess     a. s/p prior drainage.  Marland Kitchen DVT (deep venous thrombosis)     a. On Eliquis, s/p IVC filter.    Past Surgical History  Procedure Laterality Date  . Below knee leg amputation Left     2013  . Right rotator cuff surgery x 2    . Right hand surgery      Family History  Problem Relation Age of Onset  . Diabetes Mother   . Hyperlipidemia Mother   . Hypertension Mother     Social History:  reports that he has never smoked. He  has never used smokeless tobacco. He reports that he does not drink alcohol or use illicit drugs.  Allergies:  Allergies  Allergen Reactions  . Omnipaque [Iohexol]     Medications: I have reviewed the patient's current medications.  Results for orders placed during the hospital encounter of 08/11/14 (from the past 48 hour(s))  GLUCOSE, CAPILLARY     Status: Abnormal   Collection Time    08/15/14 12:05 PM      Result Value Ref Range   Glucose-Capillary 167 (*) 70 - 99 mg/dL  GLUCOSE, CAPILLARY     Status: Abnormal   Collection Time    08/15/14  4:41 PM      Result Value Ref Range   Glucose-Capillary 171 (*) 70 - 99 mg/dL  GLUCOSE, CAPILLARY     Status: Abnormal   Collection Time    08/15/14  8:20 PM      Result Value Ref Range   Glucose-Capillary 145 (*) 70 - 99 mg/dL  CBC WITH DIFFERENTIAL     Status: Abnormal   Collection Time    08/16/14  4:18 AM      Result Value Ref Range   WBC 17.7 (*)  4.0 - 10.5 K/uL   RBC 2.94 (*) 4.22 - 5.81 MIL/uL   Hemoglobin 8.3 (*) 13.0 - 17.0 g/dL   HCT 25.7 (*) 39.0 - 52.0 %   MCV 87.4  78.0 - 100.0 fL   MCH 28.2  26.0 - 34.0 pg   MCHC 32.3  30.0 - 36.0 g/dL   RDW 19.0 (*) 11.5 - 15.5 %   Platelets 202  150 - 400 K/uL   Neutrophils Relative % 83 (*) 43 - 77 %   Neutro Abs 14.6 (*) 1.7 - 7.7 K/uL   Lymphocytes Relative 8 (*) 12 - 46 %   Lymphs Abs 1.4  0.7 - 4.0 K/uL   Monocytes Relative 8  3 - 12 %   Monocytes Absolute 1.5 (*) 0.1 - 1.0 K/uL   Eosinophils Relative 1  0 - 5 %   Eosinophils Absolute 0.1  0.0 - 0.7 K/uL   Basophils Relative 0  0 - 1 %   Basophils Absolute 0.1  0.0 - 0.1 K/uL  RENAL FUNCTION PANEL     Status: Abnormal   Collection Time    08/16/14  4:18 AM      Result Value Ref Range   Sodium 136 (*) 137 - 147 mEq/L   Potassium 4.6  3.7 - 5.3 mEq/L   Chloride 96  96 - 112 mEq/L   CO2 21  19 - 32 mEq/L   Glucose, Bld 124 (*) 70 - 99 mg/dL   BUN 49 (*) 6 - 23 mg/dL   Creatinine, Ser 5.11 (*) 0.50 - 1.35 mg/dL    Calcium 8.1 (*) 8.4 - 10.5 mg/dL   Phosphorus 6.2 (*) 2.3 - 4.6 mg/dL   Albumin 2.2 (*) 3.5 - 5.2 g/dL   GFR calc non Af Amer 12 (*) >90 mL/min   GFR calc Af Amer 14 (*) >90 mL/min   Comment: (NOTE)     The eGFR has been calculated using the CKD EPI equation.     This calculation has not been validated in all clinical situations.     eGFR's persistently <90 mL/min signify possible Chronic Kidney     Disease.   Anion gap 19 (*) 5 - 15  GLUCOSE, CAPILLARY     Status: Abnormal   Collection Time    08/16/14  7:35 AM      Result Value Ref Range   Glucose-Capillary 127 (*) 70 - 99 mg/dL  GLUCOSE, CAPILLARY     Status: Abnormal   Collection Time    08/16/14 11:53 AM      Result Value Ref Range   Glucose-Capillary 175 (*) 70 - 99 mg/dL  GLUCOSE, CAPILLARY     Status: Abnormal   Collection Time    08/16/14  4:31 PM      Result Value Ref Range   Glucose-Capillary 136 (*) 70 - 99 mg/dL  GLUCOSE, CAPILLARY     Status: Abnormal   Collection Time    08/16/14  8:21 PM      Result Value Ref Range   Glucose-Capillary 141 (*) 70 - 99 mg/dL  GLUCOSE, CAPILLARY     Status: Abnormal   Collection Time    08/17/14  7:19 AM      Result Value Ref Range   Glucose-Capillary 134 (*) 70 - 99 mg/dL  CBC     Status: Abnormal   Collection Time    08/17/14 10:51 AM      Result Value Ref Range   WBC 15.4 (*)  4.0 - 10.5 K/uL   RBC 3.03 (*) 4.22 - 5.81 MIL/uL   Hemoglobin 8.9 (*) 13.0 - 17.0 g/dL   HCT 27.4 (*) 39.0 - 52.0 %   MCV 90.4  78.0 - 100.0 fL   MCH 29.4  26.0 - 34.0 pg   MCHC 32.5  30.0 - 36.0 g/dL   RDW 19.9 (*) 11.5 - 15.5 %   Platelets 171  150 - 400 K/uL  GLUCOSE, CAPILLARY     Status: Abnormal   Collection Time    08/17/14 11:19 AM      Result Value Ref Range   Glucose-Capillary 136 (*) 70 - 99 mg/dL    No results found.  ROS Blood pressure 139/55, pulse 99, temperature 98.6 F (37 C), temperature source Oral, resp. rate 18, height _0  (2.057 m), weight 181.983 kg (401 lb 3.2  oz), SpO2 100.00%. Physical Exam  Musculoskeletal:  Right posterior leg with 2 small superficial ulcerations that are covered with soft brown eschar.  These are located mid posterior calf.  Measures 1cm x 1.5cm and 1cm x 1cm.  No purulence noted.  No erythema.  Currently covered with silvercell and mepilex dressing.  Leg is not edematous.  Skin is soft although areas of exfoliating tissue over ankle.  Pt tender at medial joint line of right knee.  No effusion and he tolerated passive motion.  Wounds do not appear to be infected. Unable to palpate distal pulse.   Left LE stump soft without wounds     Assessment/Plan: Chronic venous stasis ulcers of posterior right leg.  They do not appear to be actively infected.  Currently no further aggressive treatment indicated to the wounds.  Continue local treatment with silvercell or silvadene or Mupirocin, all of which can provide local antimicrobial barrier and local relief.  Cover with local bandage.  Should try to avoid pressure to the area. Other option would include Unna dressings which can be applied and changed weekly.  VERNON,SHEILA M 08/17/2014, 11:40 AM   Reviewed note exam discussed plan for skin care with patient, wife and niece. No indication for surgery, has not used left BKA prothesis since 2013 has stump edema but not a concern. No left LE ulceration. Right left has silvadene. Multisystem medical problems poor nutrition with poor PO intake.    Thank you .  Bea Laura [phone]

## 2014-08-18 ENCOUNTER — Encounter (HOSPITAL_COMMUNITY): Payer: Self-pay | Admitting: General Practice

## 2014-08-18 DIAGNOSIS — L089 Local infection of the skin and subcutaneous tissue, unspecified: Secondary | ICD-10-CM

## 2014-08-18 LAB — RENAL FUNCTION PANEL
ALBUMIN: 2.1 g/dL — AB (ref 3.5–5.2)
ANION GAP: 14 (ref 5–15)
BUN: 41 mg/dL — AB (ref 6–23)
CHLORIDE: 97 meq/L (ref 96–112)
CO2: 26 mEq/L (ref 19–32)
Calcium: 7.9 mg/dL — ABNORMAL LOW (ref 8.4–10.5)
Creatinine, Ser: 5.01 mg/dL — ABNORMAL HIGH (ref 0.50–1.35)
GFR calc non Af Amer: 13 mL/min — ABNORMAL LOW (ref >90)
GFR, EST AFRICAN AMERICAN: 15 mL/min — AB (ref >90)
Glucose, Bld: 126 mg/dL — ABNORMAL HIGH (ref 70–99)
POTASSIUM: 3.9 meq/L (ref 3.7–5.3)
Phosphorus: 6.3 mg/dL — ABNORMAL HIGH (ref 2.3–4.6)
Sodium: 137 mEq/L (ref 137–147)

## 2014-08-18 LAB — CBC
HCT: 26.1 % — ABNORMAL LOW (ref 39.0–52.0)
HEMOGLOBIN: 8.3 g/dL — AB (ref 13.0–17.0)
MCH: 29.2 pg (ref 26.0–34.0)
MCHC: 31.8 g/dL (ref 30.0–36.0)
MCV: 91.9 fL (ref 78.0–100.0)
Platelets: 143 10*3/uL — ABNORMAL LOW (ref 150–400)
RBC: 2.84 MIL/uL — ABNORMAL LOW (ref 4.22–5.81)
RDW: 20.5 % — AB (ref 11.5–15.5)
WBC: 14.3 10*3/uL — ABNORMAL HIGH (ref 4.0–10.5)

## 2014-08-18 LAB — GLUCOSE, CAPILLARY
GLUCOSE-CAPILLARY: 98 mg/dL (ref 70–99)
GLUCOSE-CAPILLARY: 86 mg/dL (ref 70–99)
Glucose-Capillary: 126 mg/dL — ABNORMAL HIGH (ref 70–99)
Glucose-Capillary: 101 mg/dL — ABNORMAL HIGH (ref 70–99)

## 2014-08-18 MED ORDER — OXYCODONE HCL 5 MG PO TABS
10.0000 mg | ORAL_TABLET | Freq: Three times a day (TID) | ORAL | Status: DC | PRN
  Administered 2014-08-18: 10 mg via ORAL
  Filled 2014-08-18: qty 2

## 2014-08-18 MED ORDER — TRAMADOL HCL 50 MG PO TABS
50.0000 mg | ORAL_TABLET | Freq: Four times a day (QID) | ORAL | Status: DC | PRN
  Administered 2014-08-18: 50 mg via ORAL
  Filled 2014-08-18: qty 1

## 2014-08-18 MED ORDER — OXYCODONE HCL 5 MG PO TABS
5.0000 mg | ORAL_TABLET | Freq: Four times a day (QID) | ORAL | Status: DC | PRN
  Administered 2014-08-18 – 2014-08-20 (×4): 10 mg via ORAL
  Filled 2014-08-18 (×4): qty 2

## 2014-08-18 MED ORDER — OXYCODONE HCL 5 MG PO TABS
5.0000 mg | ORAL_TABLET | ORAL | Status: AC
  Administered 2014-08-18: 5 mg via ORAL
  Filled 2014-08-18: qty 1

## 2014-08-18 NOTE — Progress Notes (Signed)
[facility] PROGRESS NOTE  Angel Mays ONG:[id number] DOB: March 05, 1971 DOA: 08/11/2014 PCP: Abner Greenspan, MD  Assessment/Plan: 1 aortic valvular endocarditis/aortic valvular insufficiency -Questionable etiology. Infection may be secondary to ulcer on the lower extremity which may have seeded.  -Patient has been seen by cardiology and cardiothoracic surgery and patient deemed not operative candidate.  -MRI head pending as per wife patient with memory issues. MRI was attempted and patient given about 4 mg of IV Ativan however unable to be done. Patient at this point in time is refusing MRI under sedation and as such cancelled MRI.  -Continue empiric IV Cubicin and IV Zyvox and rifampin for now. CVTS, ID, cardiology following and appreciate input and recommendations.  -Per cardiology and cardiothoracic surgery patient has a poor prognosis.  -Palliative care consult for goals of care in process. Family wants aggressive care.    2 MRSA bacteremia Repeat Blood cx with MRSA. Continue empiric IV Cubicin and IV Zyvox, and Rifampin. ID following and appreciate input and recommendations.  3 right lower extremity wound/infection Continue current IV antibiotics. Patient has been seen by wound care, and recommendations made. X-rays of the right lower extremity negative for osteomyelitis. Orthopedics consulted  for further evaluation. Per ortho ulcers does not appears to be infected. No further aggressive treatment indicated to the wounds  4 well controlled DM2 Continue home regimen of long-acting insulin. Sliding scale insulin.  5 chronic kidney disease stage IV/5 Per wife patient has been temporarily on hemodialysis while in the hospital at [facility] which was started 08/09/2014. Patient with poor urinary output. Patient has been seen by renal and patient underwent HD. Femoral HD catheter removed. Right IJ catheter has been placed for hemodialysis. Per renal.  Planning dialysis tomorrow.   6  anemia The admission H&P patient is status post 5 units packed red blood cells at [place]. Follow H&H. Hemoglobin currently at 8.9. Transfusion threshhold Hgb <7.   7 history of DVT Patient was on Elliquis, however this was discontinue  secondary to anemia and chronic kidney disease. Patient is status post IVC filter. ?? Infected, remove IVC, defer to ID.  8 prophylaxis PPI for GI prophylaxis. SCDs for DVT prophylaxis.  9 prognosis Patient with a very poor prognosis. Patient is presenting with aortic valvular endocarditis, MRSA bacteremia, chronic kidney disease stage IV/5 currently on HD started 08/09/14 at [facility], right lower extremity wound infection, type II diabetic, morbid obesity, chronic lower extremity venostasis and also noted to be anemic. Patient deemed not a surgical candidate. Family want aggressive measures.   Swelling stump; refuse doppler.   Code Status: Full Family Communication: Updated patient and wife at bedside.  Disposition Plan:  SNF pending palliative eval and when medically stable.   Consultants:  ID: Dr. Ninetta Lights 08/12/2014  Nephrology: Dr.Powell 08/12/2014  Cardiothoracic surgery Dr. Dorris Fetch 08/11/2014  Cardiology: Dr. Rennis Golden 08/11/2014. Dr Katrinka Blazing   Procedures:  Chest x-ray 08/12/2014  TEE 08/11/2014  X-ray of the right lower extremity 08/13/2014  Right IJ trialysis catheter placement 08/17/2049  Antibiotics: Total days of antibiotics day 8  IV daptomycin 08/12/2014  IV Zyvox 08/11/2014  Oral rifampin 08/12/2014  HPI/Subjective: Patient is alert, he was able to tell me that he feels fine. He has right LE pain.  Wife notice increase size of left stump.  atient decline doppler.   Objective: Filed Vitals:   08/18/14 1126  BP: 136/43  Pulse: 97  Temp: 98.4 F (36.9 C)  Resp: 18    Intake/Output Summary (Last 24 hours) at 08/18/14 1408  Last data filed at 08/17/14 1603  Gross per 24 hour  Intake      0 ml  Output   1834 ml  Net   -1834 ml   Filed Weights   08/16/14 0623 08/17/14 0527 08/18/14 0453  Weight: 178.128 kg (392 lb 11.2 oz) 181.983 kg (401 lb 3.2 oz) 181.484 kg (400 lb 1.6 oz)    Exam:   General:  NAD  Cardiovascular: RRR  Respiratory: CTAB  Abdomen: Soft, nontender, nondistended, positive bowel sounds.  Musculoskeletal: Right leg with chronic venous stasis changes and ulcer. Left stump with swelling, chronic skin changes. Hyperpigmentation.   Data Reviewed: Basic Metabolic Panel:  Recent Labs Lab 08/11/14 2100 08/12/14 1205  08/14/14 0555 08/15/14 0939 08/16/14 0418 08/17/14 1051 08/18/14 0520  NA 140 142  < > 138 137 136* 136* 137  K 5.3 5.5*  < > 4.7 4.5 4.6 4.6 3.9  CL 101 101  < > 97 96 96 94* 97  CO2 23 23  < > 23 23 21 22 26   GLUCOSE 176* 211*  < > 106* 156* 124* 144* 126*  BUN 79* 83*  < > 63* 43* 49* 59* 41*  CREATININE 5.51* 6.26*  < > 5.25* 4.43* 5.11* 6.20* 5.01*  CALCIUM 8.1* 8.1*  < > 7.9* 7.9* 8.1* 8.1* 7.9*  MG 2.1 2.1  --   --   --   --   --   --   PHOS 5.1*  --   < > 5.4* 5.2* 6.2* 8.1* 6.3*  < > = values in this interval not displayed. Liver Function Tests:  Recent Labs Lab 08/11/14 2100 08/12/14 1205  08/14/14 0555 08/15/14 0939 08/16/14 0418 08/17/14 1051 08/18/14 0520  AST 90* 78*  --   --   --   --   --   --   ALT 44 52  --   --   --   --   --   --   ALKPHOS 129* 126*  --   --   --   --   --   --   BILITOT 1.3* 1.4*  --   --   --   --   --   --   PROT 6.1 6.3  --   --   --   --   --   --   ALBUMIN 2.0* 2.1*  < > 2.1* 2.1* 2.2* 2.2* 2.1*  < > = values in this interval not displayed. No results found for this basename: LIPASE, AMYLASE,  in the last 168 hours No results found for this basename: AMMONIA,  in the last 168 hours CBC:  Recent Labs Lab 08/11/14 2222 08/12/14 1012 08/13/14 1635 08/14/14 0555 08/15/14 0939 08/16/14 0418 08/17/14 1051 08/18/14 0520  WBC 21.5* 22.9* 21.4* 18.0* 18.8* 17.7* 15.4* 14.3*  NEUTROABS 18.5* 20.3* 17.9*   --  15.6* 14.6*  --   --   HGB 7.1* 7.1* 6.8* 8.2* 8.3* 8.3* 8.9* 8.3*  HCT 20.7* 21.6* 20.6* 24.3* 25.5* 25.7* 27.4* 26.1*  MCV 83.5 84.7 84.4 87.1 87.0 87.4 90.4 91.9  PLT 223 236 251 206 193 202 171 143*   Cardiac Enzymes:  Recent Labs Lab 08/12/14 1649  CKTOTAL 27   BNP (last 3 results) No results found for this basename: PROBNP,  in the last 8760 hours CBG:  Recent Labs Lab 08/17/14 1119 08/17/14 1620 08/17/14 2025 08/18/14 0734 08/18/14 1138  GLUCAP 136* 96 120* 98 126*  Recent Results (from the past 240 hour(s))  CULTURE, BLOOD (ROUTINE X 2)     Status: None   Collection Time    08/11/14  8:43 PM      Result Value Ref Range Status   Specimen Description BLOOD LEFT HAND   Final   Special Requests BOTTLES DRAWN AEROBIC AND ANAEROBIC 10CC EACH   Final   Culture  Setup Time     Final   Value: 08/12/2014 03:41     Performed at [facility]   Culture     Final   Value: GRAM POSITIVE COCCI IN CLUSTERS     Note: Culture results may be compromised due to an excessive volume of blood received in culture bottles. Gram Stain Report Called to,Read Back By and Verified With: Jannette Spanner RN ON 09.25.15 AT 1938 BY HENDJ     Performed at [facility]   Report Status 08/15/2014 FINAL   Final  CULTURE, BLOOD (ROUTINE X 2)     Status: None   Collection Time    08/11/14  9:00 PM      Result Value Ref Range Status   Specimen Description BLOOD LEFT HAND   Final   Special Requests BOTTLES DRAWN AEROBIC ONLY 5CC   Final   Culture  Setup Time     Final   Value: 08/12/2014 03:38     Performed at [facility]   Culture     Final   Value: METHICILLIN RESISTANT STAPHYLOCOCCUS AUREUS     Note: RIFAMPIN AND GENTAMICIN SHOULD NOT BE USED AS SINGLE DRUGS FOR TREATMENT OF STAPH INFECTIONS. CRITICAL RESULT CALLED TO, READ BACK BY AND VERIFIED WITH: JANET [facility] 08/15/14 @ 10:10AM BY RUSCOE A.     Note: Gram Stain Report Called to,Read Back By and Verified With: ELENA  MOSQUEDA @ 1038 ON [id number] BY [facility]     Performed at [facility]   Report Status PENDING   Incomplete   Organism ID, Bacteria METHICILLIN RESISTANT STAPHYLOCOCCUS AUREUS   Final  URINE CULTURE     Status: None   Collection Time    08/12/14  6:00 PM      Result Value Ref Range Status   Specimen Description URINE, RANDOM   Final   Special Requests NONE   Final   Culture  Setup Time     Final   Value: 08/12/2014 20:13     Performed at [facility] Count     Final   Value: NO GROWTH     Performed at [facility]   Culture     Final   Value: NO GROWTH     Performed at [facility]   Report Status 08/13/2014 FINAL   Final     Studies: Ir Fluoro Guide Cv Line Right  08/17/2014   CLINICAL DATA:  Patient for temporary HD catheter to resume HD on 08/17/14; temp  TECHNIQUE: RIGHT IJ CATHETER PLACEMENT UNDER ULTRASOUND AND FLUOROSCOPIC GUIDANCE  The procedure, risks (including but not limited to bleeding, infection, organ damage, pneumothorax), benefits, and alternatives were explained to the patient. Questions regarding the procedure were encouraged and answered. The patient understands and consents to the procedure. Patency of the right IJ vein was confirmed with ultrasound with image documentation. An appropriate skin site was determined. Skin site was marked. Region was prepped using maximum barrier technique including cap and mask, sterile gown, sterile gloves, large sterile sheet, and Chlorhexidine as cutaneous antisepsis. The  region was infiltrated locally with 1% lidocaine. Under real-time ultrasound guidance, the right IJ vein was accessed with a 19 gauge needle; the needle tip within the vein was confirmed with ultrasound image documentation. The needle exchanged over a guidewire for vascular dilator which allowed advancement of a 20 cm Trialysis catheter. This was positioned with the tip at the cavoatrial junction. Spot chest radiograph shows good  positioning and no pneumothorax. Catheter was flushed and sutured externally with 0-Prolene sutures. Patient tolerated the procedure well, with no immediate complication.  IMPRESSION: 1. Technically successful right IJ Trialysis catheter placement.   Electronically Signed   By: Oley Balm M.D.   On: 08/17/2014 14:46   Ir US Guide Vasc Access Right  08/17/2014   CLINICAL DATA:  Patient for temporary HD catheter to resume HD on 08/17/14; temp  TECHNIQUE: RIGHT IJ CATHETER PLACEMENT UNDER ULTRASOUND AND FLUOROSCOPIC GUIDANCE  The procedure, risks (including but not limited to bleeding, infection, organ damage, pneumothorax), benefits, and alternatives were explained to the patient. Questions regarding the procedure were encouraged and answered. The patient understands and consents to the procedure. Patency of the right IJ vein was confirmed with ultrasound with image documentation. An appropriate skin site was determined. Skin site was marked. Region was prepped using maximum barrier technique including cap and mask, sterile gown, sterile gloves, large sterile sheet, and Chlorhexidine as cutaneous antisepsis. The region was infiltrated locally with 1% lidocaine. Under real-time ultrasound guidance, the right IJ vein was accessed with a 19 gauge needle; the needle tip within the vein was confirmed with ultrasound image documentation. The needle exchanged over a guidewire for vascular dilator which allowed advancement of a 20 cm Trialysis catheter. This was positioned with the tip at the cavoatrial junction. Spot chest radiograph shows good positioning and no pneumothorax. Catheter was flushed and sutured externally with 0-Prolene sutures. Patient tolerated the procedure well, with no immediate complication.  IMPRESSION: 1. Technically successful right IJ Trialysis catheter placement.   Electronically Signed   By: Oley Balm M.D.   On: 08/17/2014 14:46    Scheduled Meds: . sodium chloride   Intravenous  Once  . ARIPiprazole  5 mg Oral Daily  . calcium acetate  667 mg Oral TID WC  . DAPTOmycin (CUBICIN)  IV  1,000 mg Intravenous Q48H  . feeding supplement (GLUCERNA SHAKE)  237 mL Oral TID BM  . feeding supplement (PRO-STAT SUGAR FREE 64)  30 mL Oral BID  . hydrocerin  1 application Topical Daily  . insulin aspart  0-5 Units Subcutaneous QHS  . insulin aspart  0-9 Units Subcutaneous TID WC  . insulin detemir  15 Units Subcutaneous QHS  . linezolid  600 mg Intravenous Q12H  . LORazepam  2 mg Intravenous Once  . rifampin (RIFADIN) IVPB  300 mg Intravenous 3 times per day  . sodium chloride  3 mL Intravenous Q12H   Continuous Infusions:   Principal Problem:   Endocarditis of aortic valve Active Problems:   Acute encephalopathy   CKD (chronic kidney disease)   Leg ulcer   Diabetes mellitus   HTN (hypertension)   History of DVT (deep vein thrombosis)   Infectious aortic insufficiency   Bacteremia   DNR (do not resuscitate) discussion   Weakness generalized   Palliative care encounter    Time spent: 25 minutes    Hartley Barefoot A MD [facility] Pager [phone]. If 7PM-7AM, please contact night-coverage at [email or web address], password [facility] 08/18/2014, 2:08 PM  LOS: 7 days

## 2014-08-18 NOTE — Progress Notes (Signed)
I have seen and examined this patient and agree with the plan of care   Silvanna Ohmer W 08/18/2014, 12:35 PM  

## 2014-08-18 NOTE — Progress Notes (Signed)
INFECTIOUS DISEASE PROGRESS NOTE  ID: Angel Mays is a 43 y.o. male with  Principal Problem:   Endocarditis of aortic valve Active Problems:   Acute encephalopathy   CKD (chronic kidney disease)   Leg ulcer   Diabetes mellitus   HTN (hypertension)   History of DVT (deep vein thrombosis)   Infectious aortic insufficiency   Bacteremia   DNR (do not resuscitate) discussion   Weakness generalized   Palliative care encounter  Subjective: C/o fatigue  Abtx:  Anti-infectives   Start     Dose/Rate Route Frequency Ordered Stop   08/15/14 0200  DAPTOmycin (CUBICIN) 1,000 mg in sodium chloride 0.9 % IVPB     1,000 mg 240 mL/hr over 30 Minutes Intravenous Every 48 hours 08/15/14 0113     08/12/14 2300  DAPTOmycin (CUBICIN) 1,000 mg in sodium chloride 0.9 % IVPB  Status:  Discontinued     1,000 mg 240 mL/hr over 30 Minutes Intravenous Every 48 hours 08/11/14 2038 08/15/14 0112   08/12/14 1515  rifampin (RIFADIN) 300 mg in sodium chloride 0.9 % 100 mL IVPB     300 mg 200 mL/hr over 30 Minutes Intravenous 3 times per day 08/12/14 1449     08/11/14 2200  linezolid (ZYVOX) IVPB 600 mg     600 mg 300 mL/hr over 60 Minutes Intravenous Every 12 hours 08/11/14 1834        Medications:  Scheduled: . sodium chloride   Intravenous Once  . ARIPiprazole  5 mg Oral Daily  . calcium acetate  667 mg Oral TID WC  . DAPTOmycin (CUBICIN)  IV  1,000 mg Intravenous Q48H  . feeding supplement (GLUCERNA SHAKE)  237 mL Oral TID BM  . feeding supplement (PRO-STAT SUGAR FREE 64)  30 mL Oral BID  . hydrocerin  1 application Topical Daily  . insulin aspart  0-5 Units Subcutaneous QHS  . insulin aspart  0-9 Units Subcutaneous TID WC  . insulin detemir  15 Units Subcutaneous QHS  . linezolid  600 mg Intravenous Q12H  . LORazepam  2 mg Intravenous Once  . rifampin (RIFADIN) IVPB  300 mg Intravenous 3 times per day  . sodium chloride  3 mL Intravenous Q12H    Objective: Vital signs in last 24  hours: Temp:  [97.6 F (36.4 C)-98.4 F (36.9 C)] 98.1 F (36.7 C) (09/30 1457) Pulse Rate:  [72-101] 98 (09/30 1457) Resp:  [18-20] 18 (09/30 1126) BP: (94-145)/(37-62) 131/62 mmHg (09/30 1457) SpO2:  [98 %-100 %] 99 % (09/30 1457) Weight:  [181.484 kg (400 lb 1.6 oz)] 181.484 kg (400 lb 1.6 oz) (09/30 0453)   General appearance: alert, appears stated age, fatigued and no distress Resp: clear to auscultation bilaterally Cardio: regular rate and rhythm GI: normal findings: bowel sounds normal and soft, non-tender Extremities: RLE dressed with salve  Lab Results  Recent Labs  08/17/14 1051 08/18/14 0520  WBC 15.4* 14.3*  HGB 8.9* 8.3*  HCT 27.4* 26.1*  NA 136* 137  K 4.6 3.9  CL 94* 97  CO2 22 26  BUN 59* 41*  CREATININE 6.20* 5.01*   Liver Panel  Recent Labs  08/17/14 1051 08/18/14 0520  ALBUMIN 2.2* 2.1*   Sedimentation Rate No results found for this basename: ESRSEDRATE,  in the last 72 hours C-Reactive Protein No results found for this basename: CRP,  in the last 72 hours  Microbiology: Recent Results (from the past 240 hour(s))  CULTURE, BLOOD (ROUTINE X 2)  Status: None   Collection Time    08/11/14  8:43 PM      Result Value Ref Range Status   Specimen Description BLOOD LEFT HAND   Final   Special Requests BOTTLES DRAWN AEROBIC AND ANAEROBIC 10CC EACH   Final   Culture  Setup Time     Final   Value: 08/12/2014 03:41     Performed at [facility]   Culture     Final   Value: GRAM POSITIVE COCCI IN CLUSTERS     Note: Culture results may be compromised due to an excessive volume of blood received in culture bottles. Gram Stain Report Called to,Read Back By and Verified With: Jannette SpannerELAINA M RN ON 09.25.15 AT 1938 BY HENDJ     Performed at [facility]   Report Status 08/15/2014 FINAL   Final  CULTURE, BLOOD (ROUTINE X 2)     Status: None   Collection Time    08/11/14  9:00 PM      Result Value Ref Range Status   Specimen Description  BLOOD LEFT HAND   Final   Special Requests BOTTLES DRAWN AEROBIC ONLY 5CC   Final   Culture  Setup Time     Final   Value: 08/12/2014 03:38     Performed at [facility]   Culture     Final   Value: METHICILLIN RESISTANT STAPHYLOCOCCUS AUREUS     Note: RIFAMPIN AND GENTAMICIN SHOULD NOT BE USED AS SINGLE DRUGS FOR TREATMENT OF STAPH INFECTIONS. CRITICAL RESULT CALLED TO, READ BACK BY AND VERIFIED WITH: [facility]ET [facility] 08/15/14 @ 10:10AM BY RUSCOE A.     Note: Gram Stain Report Called to,Read Back By and Verified With: ELENA MOSQUEDA @ 1038 ON [id number] BY [facility]     Performed at [facility]   Report Status PENDING   Incomplete   Organism ID, Bacteria METHICILLIN RESISTANT STAPHYLOCOCCUS AUREUS   Final  URINE CULTURE     Status: None   Collection Time    08/12/14  6:00 PM      Result Value Ref Range Status   Specimen Description URINE, RANDOM   Final   Special Requests NONE   Final   Culture  Setup Time     Final   Value: 08/12/2014 20:13     Performed at [facility]   Colony Count     Final   Value: NO GROWTH     Performed at [facility]   Culture     Final   Value: NO GROWTH     Performed at [facility]   Report Status 08/13/2014 FINAL   Final    Studies/Results: Ir Fluoro Guide Cv Line Right  08/17/2014   CLINICAL DATA:  Patient for temporary HD catheter to resume HD on 08/17/14; temp  TECHNIQUE: RIGHT IJ CATHETER PLACEMENT UNDER ULTRASOUND AND FLUOROSCOPIC GUIDANCE  The procedure, risks (including but not limited to bleeding, infection, organ damage, pneumothorax), benefits, and alternatives were explained to the patient. Questions regarding the procedure were encouraged and answered. The patient understands and consents to the procedure. Patency of the right IJ vein was confirmed with ultrasound with image documentation. An appropriate skin site was determined. Skin site was marked. Region was prepped using maximum barrier technique including  cap and mask, sterile gown, sterile gloves, large sterile sheet, and Chlorhexidine as cutaneous antisepsis. The region was infiltrated locally with 1% lidocaine. Under real-time ultrasound guidance, the right IJ vein was accessed  with a 19 gauge needle; the needle tip within the vein was confirmed with ultrasound image documentation. The needle exchanged over a guidewire for vascular dilator which allowed advancement of a 20 cm Trialysis catheter. This was positioned with the tip at the cavoatrial junction. Spot chest radiograph shows good positioning and no pneumothorax. Catheter was flushed and sutured externally with 0-Prolene sutures. Patient tolerated the procedure well, with no immediate complication.  IMPRESSION: 1. Technically successful right IJ Trialysis catheter placement.   Electronically Signed   By: Oley Balm M.D.   On: 08/17/2014 14:46   Ir US Guide Vasc Access Right  08/17/2014   CLINICAL DATA:  Patient for temporary HD catheter to resume HD on 08/17/14; temp  TECHNIQUE: RIGHT IJ CATHETER PLACEMENT UNDER ULTRASOUND AND FLUOROSCOPIC GUIDANCE  The procedure, risks (including but not limited to bleeding, infection, organ damage, pneumothorax), benefits, and alternatives were explained to the patient. Questions regarding the procedure were encouraged and answered. The patient understands and consents to the procedure. Patency of the right IJ vein was confirmed with ultrasound with image documentation. An appropriate skin site was determined. Skin site was marked. Region was prepped using maximum barrier technique including cap and mask, sterile gown, sterile gloves, large sterile sheet, and Chlorhexidine as cutaneous antisepsis. The region was infiltrated locally with 1% lidocaine. Under real-time ultrasound guidance, the right IJ vein was accessed with a 19 gauge needle; the needle tip within the vein was confirmed with ultrasound image documentation. The needle exchanged over a guidewire for  vascular dilator which allowed advancement of a 20 cm Trialysis catheter. This was positioned with the tip at the cavoatrial junction. Spot chest radiograph shows good positioning and no pneumothorax. Catheter was flushed and sutured externally with 0-Prolene sutures. Patient tolerated the procedure well, with no immediate complication.  IMPRESSION: 1. Technically successful right IJ Trialysis catheter placement.   Electronically Signed   By: Oley Balm M.D.   On: 08/17/2014 14:46     Assessment/Plan: Endocarditis of Ao  MRSA bacteremia  ESRD  RLE infection  Diabetic Ulcer  Diabetes mellitus 2  Hx of C diff (02-2014)   Total days of antibiotics cubicin/zyvox/rifampin  Agree that he could have continued bacteremia due to infected IVC however given his overall prognosis I would not remove this.  dapto sensitivities out tomorrow.  Repeat BCx are pending. If this is negative (and we are starting to clear his bacteremia) would consider dropping one of his meds.  Need to watch his PLTs as they are dropping (as would be expected with linezolid use).  Repeat CK (for dapto monitoring ) will be done tomorrow          Johny Sax Infectious Diseases (pager) [phone] [email or web address] 08/18/2014, 3:05 PM  LOS: 7 days

## 2014-08-18 NOTE — Progress Notes (Signed)
Subjective: No significant overnight events. No new complaints.  Tolerated HD well yesterday. S/p RIJ temporary HD cath placement 08/17/14.  Total output w/ HD 1.8L. Cr 5.01 this AM (from 6.20). Phos improved to 6.3 (from 8.1).   Objective:  Scheduled Meds: . sodium chloride   Intravenous Once  . ARIPiprazole  5 mg Oral Daily  . calcium acetate  667 mg Oral TID WC  . DAPTOmycin (CUBICIN)  IV  1,000 mg Intravenous Q48H  . feeding supplement (GLUCERNA SHAKE)  237 mL Oral TID BM  . feeding supplement (PRO-STAT SUGAR FREE 64)  30 mL Oral BID  . hydrocerin  1 application Topical Daily  . insulin aspart  0-5 Units Subcutaneous QHS  . insulin aspart  0-9 Units Subcutaneous TID WC  . insulin detemir  15 Units Subcutaneous QHS  . linezolid  600 mg Intravenous Q12H  . LORazepam  2 mg Intravenous Once  . rifampin (RIFADIN) IVPB  300 mg Intravenous 3 times per day  . sodium chloride  3 mL Intravenous Q12H   Continuous Infusions:  PRN Meds:.acetaminophen, acetaminophen, albuterol, calcium carbonate (dosed in mg elemental calcium), camphor-menthol, docusate sodium, feeding supplement (NEPRO CARB STEADY), hydrOXYzine, LORazepam, ondansetron (ZOFRAN) IV, ondansetron, oxyCODONE, sodium chloride, sorbitol, zolpidem  BP 140/50  Pulse 72  Temp(Src) 97.8 F (36.6 C) (Oral)  Resp 18  Ht 6\' 9"  (2.057 m)  Wt 400 lb 1.6 oz (181.484 kg)  BMI 42.89 kg/m2  SpO2 99%   Intake/Output Summary (Last 24 hours) at 08/18/14 0711 Last data filed at 08/17/14 1603  Gross per 24 hour  Intake      0 ml  Output   1834 ml  Net  -1834 ml    Weight change: -1 lb 1.6 oz (-0.499 kg) 08/18/14 0453 400 lb 1.6 oz (181.484 kg) 08/17/14 0527 401 lb 3.2 oz (181.983 kg) 08/16/14 0623 392 lb 11.2 oz (178.128 kg) 08/14/14 1520 388 lb 3.7 oz (176.1 kg) 08/14/14 1102 392 lb 13.8 oz (178.2 kg) 08/14/14 0500 392 lb 6.7 oz (178 kg)  08/13/14 2137 390 lb 7 oz (177.1 kg) 08/13/14 1635 394 lb 6.5 oz (178.9 kg) 08/13/14 0502  387 lb 1.6 oz (175.587 kg)  08/11/14 1700 376 lb (170.552 kg)   EXAM:  General: Morbidly obese male, alert, cooperative, NAD. Chronically ill-appearing.  HEENT: PERRL, EOMI. Moist mucus membranes.  Neck: Full range of motion without pain, supple, no lymphadenopathy or carotid bruits. RIJ HD catheter in place.  Lungs: Air entry equal bilaterally, rales at bases. No wheezes. On room air this AM.  Heart: Regular rate and rhythm, 3/6 combined systolic/diastolic murmur heard, loudest at left upper sternal border. No rubs or gallops.  Abdomen: Obese, non-tender, non-distended. BS present.  Extremities: RLE w/ severe swelling, erythema, and venous stasis changes. Foot w/ large overlying scab/chronic skin changes on dorsum of the foot. Leg w/ large overlying eschar on medial surface. Underlying skin erythematous, edematous. LLE w/ BKA.  Neurologic: Alert & oriented x3, cranial nerves grossly intact, strength grossly intact. Depressed mood, flat affect.    Labs: Basic Metabolic Panel:  Recent Labs Lab 08/11/14 2100 08/12/14 1205  08/16/14 0418 08/17/14 1051 08/18/14 0520  NA 140 142  < > 136* 136* 137  K 5.3 5.5*  < > 4.6 4.6 3.9  CL 101 101  < > 96 94* 97  CO2 23 23  < > 21 22 26   GLUCOSE 176* 211*  < > 124* 144* 126*  BUN 79* 83*  < >  49* 59* 41*  CREATININE 5.51* 6.26*  < > 5.11* 6.20* 5.01*  CALCIUM 8.1* 8.1*  < > 8.1* 8.1* 7.9*  MG 2.1 2.1  --   --   --   --   PHOS 5.1*  --   < > 6.2* 8.1* 6.3*  < > = values in this interval not displayed.  Liver Function Tests:  Recent Labs Lab 08/11/14 2100 08/12/14 1205  08/16/14 0418 08/17/14 1051 08/18/14 0520  AST 90* 78*  --   --   --   --   ALT 44 52  --   --   --   --   ALKPHOS 129* 126*  --   --   --   --   BILITOT 1.3* 1.4*  --   --   --   --   PROT 6.1 6.3  --   --   --   --   ALBUMIN 2.0* 2.1*  < > 2.2* 2.2* 2.1*  < > = values in this interval not displayed.  CBC:  Recent Labs Lab 08/13/14 1635 08/14/14 0555  08/15/14 0939 08/16/14 0418 08/17/14 1051 08/18/14 0520  WBC 21.4* 18.0* 18.8* 17.7* 15.4* 14.3*  NEUTROABS 17.9*  --  15.6* 14.6*  --   --   HGB 6.8* 8.2* 8.3* 8.3* 8.9* 8.3*  HCT 20.6* 24.3* 25.5* 25.7* 27.4* 26.1*  MCV 84.4 87.1 87.0 87.4 90.4 91.9  PLT 251 206 193 202 171 143*    Cardiac Enzymes:  Recent Labs Lab 08/12/14 1649  CKTOTAL 27    CBG:  Recent Labs Lab 08/16/14 2021 08/17/14 0719 08/17/14 1119 08/17/14 1620 08/17/14 2025  GLUCAP 141* 134* 136* 96 120*     Assessment/Plan: 43 y/o M w/ multiple co-morbidities, most significant for LE chronic venous stasis changes and RLE leg infection, RLE BKA, DM type II, CKD stage 5, transferred from [facility] w/ MRSA bacteremia, found to have large aortic vegetation on TEE. Also w/ worsening renal failure requiring HD via temporary left femoral HD catheter.   CKD stage 5- Patient still anuric, feel that this may be his new baseline and will require regular HD if he recovers from his multiple, severe, medical illnesses. Tolerated HD well yesterday, out ~1.8L. No w/ RIJ temporary HD catheter. Not currently a candidate for permanent catheter placement given his ongoing bacteremia. Will give HD again tomorrow. MRSA bacteremia- Had previously been treated for MRSA bacteremia in 06/2014 w/ 4 week course of Vancomycin. Repeat blood cultures from 08/11/14 positive for MRSA. Currently on Linezolid, Daptomycin, and Rifampin. Leukocytosis slowly improving, now 14.3. Blood cultures repeated yesterday.  Aortic Valve Infective Endocarditis- TEE at [facility] from 08/11/14 shows large 1 cm vegetation on the aortic valve. Not surgical candidate. Palliative Care following as well. RLE venous stasis ulcers- Seen by ortho yesterday, do not feel patient requires amputation at this time as they feel leg is not currently infected.  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 Normocytic Anemia- Baseline Hb 9. 8.3 this AM. Continue to  monitor.  Hyperphosphatemia- Phos improved to 6.3 this AM. Continue Phoslo 667 mg po tid (started on 08/16/14).   Disposition- Unclear at this time. Family interested on maximizing medical care at this time. Palliative care medicine on board. Patient w/ very poor prognosis at this time given multiple severe medical illnesses.    Lars Masson PGY-2, Internal Medicine  Pager: [phone]

## 2014-08-18 NOTE — Progress Notes (Signed)
       Patient Name: Angel Mays Date of Encounter: 08/18/2014    SUBJECTIVE: The patient is more alert this morning. The the family or patient voices complaints that are cardiopulmonary related.  TELEMETRY:  Atrial fibrillation with controlled rate Filed Vitals:   08/17/14 1603 08/17/14 2030 08/18/14 0049 08/18/14 0453  BP: 99/37 145/58  140/50  Pulse: 100 101  72  Temp: 97.8 F (36.6 C) 97.6 F (36.4 C)  97.8 F (36.6 C)  TempSrc: Oral Oral  Oral  Resp: 18 18  18   Height:      Weight:    400 lb 1.6 oz (181.484 kg)  SpO2: 100% 98% 100% 99%    Intake/Output Summary (Last 24 hours) at 08/18/14 1017 Last data filed at 08/17/14 1603  Gross per 24 hour  Intake      0 ml  Output   1834 ml  Net  -1834 ml   LABS: Basic Metabolic Panel:  Recent Labs  16/08/9608/29/15 1051 08/18/14 0520  NA 136* 137  K 4.6 3.9  CL 94* 97  CO2 22 26  GLUCOSE 144* 126*  BUN 59* 41*  CREATININE 6.20* 5.01*  CALCIUM 8.1* 7.9*  PHOS 8.1* 6.3*   CBC:  Recent Labs  08/16/14 0418 08/17/14 1051 08/18/14 0520  WBC 17.7* 15.4* 14.3*  NEUTROABS 14.6*  --   --   HGB 8.3* 8.9* 8.3*  HCT 25.7* 27.4* 26.1*  MCV 87.4 90.4 91.9  PLT 202 171 143*     Radiology/Studies:  Noted in the  Physical Exam: Blood pressure 140/50, pulse 72, temperature 97.8 F (36.6 C), temperature source Oral, resp. rate 18, height 6\' 9"  (2.057 m), weight 400 lb 1.6 oz (181.484 kg), SpO2 99.00%. Weight change: -1 lb 1.6 oz (-0.499 kg)  Wt Readings from Last 3 Encounters:  08/18/14 400 lb 1.6 oz (181.484 kg)  01/26/14 414 lb 7.4 oz (188 kg)  01/11/14 490 lb (222.263 kg)   Chest is clear anteriorly Possible pericardial friction rub and systolic murmur left lower sternal border. Reveals a more alert patient with how spontaneous speech Right lower extremity not reexamined  ASSESSMENT:  1. 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 He is mild to moderate aortic regurgitation and no clinical evidence  of heart failure 2. Possible pericardial friction rub 3. End stage kidney disease with intermittent 4. Right lower extremity wound/infection/hypoperfusion   Plan:  No active recommendations at this time. Orthopedic consultation needs followup by attending Discussed case with wife. One of her questions is how likely R. we've to eradicate infection with medical therapy only. I deferred to Dr. Rowland LatheHatcher/ID service Continue to follow and be involved as needed  Signed, Lesleigh NoeSMITH Mays,Angel Giraud W 08/18/2014, 10:17 AM

## 2014-08-18 NOTE — Progress Notes (Signed)
ANTIBIOTIC CONSULT NOTE - FOLLOW UP  Pharmacy Consult for Daptomycin Indication: endocarditis  Allergies  Allergen Reactions  . Omnipaque [Iohexol]     Patient Measurements: Height: 6\' 9"  (205.7 cm) Weight: 400 lb 1.6 oz (181.484 kg) IBW/kg (Calculated) : 98.3 Adjusted Body Weight:    Vital Signs: Temp: 98.4 F (36.9 C) (09/30 1126) Temp src: Oral (09/30 1126) BP: 136/43 mmHg (09/30 1126) Pulse Rate: 97 (09/30 1126) Intake/Output from previous day: 09/29 0701 - 09/30 0700 In: -  Out: 1834  Intake/Output from this shift:    Labs:  Recent Labs  08/16/14 0418 08/17/14 1051 08/18/14 0520  WBC 17.7* 15.4* 14.3*  HGB 8.3* 8.9* 8.3*  PLT 202 171 143*  CREATININE 5.11* 6.20* 5.01*   Estimated Creatinine Clearance: 34.7 ml/min (by C-G formula based on Cr of 5.01). No results found for this basename: VANCOTROUGH, Leodis BinetVANCOPEAK, VANCORANDOM, GENTTROUGH, GENTPEAK, GENTRANDOM, TOBRATROUGH, TOBRAPEAK, TOBRARND, AMIKACINPEAK, AMIKACINTROU, AMIKACIN,  in the last 72 hours   Microbiology: Recent Results (from the past 720 hour(s))  CULTURE, BLOOD (ROUTINE X 2)     Status: None   Collection Time    08/11/14  8:43 PM      Result Value Ref Range Status   Specimen Description BLOOD LEFT HAND   Final   Special Requests BOTTLES DRAWN AEROBIC AND ANAEROBIC 10CC EACH   Final   Culture  Setup Time     Final   Value: 08/12/2014 03:41     Performed at [facility]   Culture     Final   Value: GRAM POSITIVE COCCI IN CLUSTERS     Note: Culture results may be compromised due to an excessive volume of blood received in culture bottles. Gram Stain Report Called to,Read Back By and Verified With: Jannette SpannerELAINA M RN ON 09.25.15 AT 1938 BY HENDJ     Performed at [facility]   Report Status 08/15/2014 FINAL   Final  CULTURE, BLOOD (ROUTINE X 2)     Status: None   Collection Time    08/11/14  9:00 PM      Result Value Ref Range Status   Specimen Description BLOOD LEFT HAND   Final    Special Requests BOTTLES DRAWN AEROBIC ONLY 5CC   Final   Culture  Setup Time     Final   Value: 08/12/2014 03:38     Performed at [facility]   Culture     Final   Value: METHICILLIN RESISTANT STAPHYLOCOCCUS AUREUS     Note: RIFAMPIN AND GENTAMICIN SHOULD NOT BE USED AS SINGLE DRUGS FOR TREATMENT OF STAPH INFECTIONS. CRITICAL RESULT CALLED TO, READ BACK BY AND VERIFIED WITH: [facility]ET [facility] 08/15/14 @ 10:10AM BY RUSCOE A.     Note: Gram Stain Report Called to,Read Back By and Verified With: ELENA MOSQUEDA @ 1038 ON [id number] BY [facility]     Performed at [facility]   Report Status PENDING   Incomplete   Organism ID, Bacteria METHICILLIN RESISTANT STAPHYLOCOCCUS AUREUS   Final  URINE CULTURE     Status: None   Collection Time    08/12/14  6:00 PM      Result Value Ref Range Status   Specimen Description URINE, RANDOM   Final   Special Requests NONE   Final   Culture  Setup Time     Final   Value: 08/12/2014 20:13     Performed at [facility]   Colony Count     Final  Value: NO GROWTH     Performed at [facility]   Culture     Final   Value: NO GROWTH     Performed at [facility]   Report Status 08/13/2014 FINAL   Final    Anti-infectives   Start     Dose/Rate Route Frequency Ordered Stop   08/15/14 0200  DAPTOmycin (CUBICIN) 1,000 mg in sodium chloride 0.9 % IVPB     1,000 mg 240 mL/hr over 30 Minutes Intravenous Every 48 hours 08/15/14 0113     08/12/14 2300  DAPTOmycin (CUBICIN) 1,000 mg in sodium chloride 0.9 % IVPB  Status:  Discontinued     1,000 mg 240 mL/hr over 30 Minutes Intravenous Every 48 hours 08/11/14 2038 08/15/14 0112   08/12/14 1515  rifampin (RIFADIN) 300 mg in sodium chloride 0.9 % 100 mL IVPB     300 mg 200 mL/hr over 30 Minutes Intravenous 3 times per day 08/12/14 1449     08/11/14 2200  linezolid (ZYVOX) IVPB 600 mg     600 mg 300 mL/hr over 60 Minutes Intravenous Every 12 hours 08/11/14 1834         Assessment:  43 y/o M with complicated PMH including staph bacteremia 8/15. A TTE at that time showed no evidence of endocarditis. Readmitted to [facility] for MRSA bacteremia and now recent TEE showed aortic valve endocarditis with mobile vegetation.  [facility] pharmacy contacted - patient rec'd daptomycin 1000mg  IV q48 hours (last dose was 9/22@2300 ) and Zyvox 600mg  IV q12 hours. No recent CK was seen and Scr elevated 6.2.  Anticoagulation: Apixaban PTA - hx DVT, s/p IVC filter --> on hold 2nd anemia (req'd 5 units at [facility]), Hgb now more stable at 8.3. SCDs.  Infectious Disease: Dapto-Rx/Zyvox-MD: MRSA bacteremia + aortic valve endocarditis (source PICC vs ulcers); previously rec'd Vanc x 4 weeks; Afebrile, WBC 14.3 down. Dapto/Linezolid started at [facility] on 9/22.  - IBW/kg (Calculated) : 98.3, ABW 178.1 kg, AdjBW: 122.24 kg - CK 27 (next due 9/1)  Dapto 9/22 > Linezolid 9/22 > Rifampin 9/24 > Bactrim PTA started 9/15   9/23: BCx >> MRSA 9/24: UCx negative HIV: nonreactive  Cardiovascular: hx HTN; NSR, 136/43, HR 97. Endocarditis (seen on TEE at OSH) - not a surgical candidate based on co-morbidities (R LE leg ulcer with chronic venous stasis, L BKA). Pt in afib. No HF. Possible pericardial friction rub.  Endocrinology: DM - cbgs 96-136, on levemir 15/hs and SSI  Gastrointestinal / Nutrition: AST mildly elevated. Morbid obesity/bed bound.  Neurology: Abilify restarted, AMS. Hold Zoloft while on Zyvox. Acute encephalopathy.  Nephrology: CKD recently requiring HD - HD last 4/29, Phos 6.3. Currently anuric.S/p RIJ temporary HD cath placement 08/17/14.   Pulmonary: 98% on 2L  Hematology / Oncology: see [facility]  PTA Medication Issues: apix, coreg, clonidine, ferr sulf, remeron, oxycontin, trental, zoloft (hold on Zyvox)  Best Practices: SCDs  Plan: 1. Continue Dapto 1000mg  IV q48h (~6mg /kg/dose - dosing discussed with Minh). 2. Continue Zyvox 600mg  IV q12h and  3. Rifampin 300mg  q8h 4.  Monitor CK (qThurs) 5. Start some DVT prophylaxis?   Catori Panozzo S. Merilynn Finland, PharmD, BCPS Clinical Staff Pharmacist Pager [phone]  Misty Stanley Stillinger 08/18/2014,1:27 PM

## 2014-08-19 ENCOUNTER — Ambulatory Visit: Payer: Self-pay | Admitting: Internal Medicine

## 2014-08-19 DIAGNOSIS — Z7189 Other specified counseling: Secondary | ICD-10-CM

## 2014-08-19 DIAGNOSIS — R531 Weakness: Secondary | ICD-10-CM

## 2014-08-19 DIAGNOSIS — I330 Acute and subacute infective endocarditis: Principal | ICD-10-CM

## 2014-08-19 DIAGNOSIS — N184 Chronic kidney disease, stage 4 (severe): Secondary | ICD-10-CM

## 2014-08-19 DIAGNOSIS — D649 Anemia, unspecified: Secondary | ICD-10-CM

## 2014-08-19 LAB — GLUCOSE, CAPILLARY
GLUCOSE-CAPILLARY: 85 mg/dL (ref 70–99)
GLUCOSE-CAPILLARY: 150 mg/dL — AB (ref 70–99)
Glucose-Capillary: 138 mg/dL — ABNORMAL HIGH (ref 70–99)
Glucose-Capillary: 116 mg/dL — ABNORMAL HIGH (ref 70–99)

## 2014-08-19 LAB — CBC
HCT: 26.5 % — ABNORMAL LOW (ref 39.0–52.0)
Hemoglobin: 8.5 g/dL — ABNORMAL LOW (ref 13.0–17.0)
MCH: 28.9 pg (ref 26.0–34.0)
MCHC: 32.1 g/dL (ref 30.0–36.0)
MCV: 90.1 fL (ref 78.0–100.0)
Platelets: 129 10*3/uL — ABNORMAL LOW (ref 150–400)
RBC: 2.94 MIL/uL — ABNORMAL LOW (ref 4.22–5.81)
RDW: 20.7 % — AB (ref 11.5–15.5)
WBC: 18 10*3/uL — ABNORMAL HIGH (ref 4.0–10.5)

## 2014-08-19 LAB — RENAL FUNCTION PANEL
ANION GAP: 18 — AB (ref 5–15)
Albumin: 2.1 g/dL — ABNORMAL LOW (ref 3.5–5.2)
BUN: 49 mg/dL — ABNORMAL HIGH (ref 6–23)
CHLORIDE: 96 meq/L (ref 96–112)
CO2: 24 mEq/L (ref 19–32)
Calcium: 8.1 mg/dL — ABNORMAL LOW (ref 8.4–10.5)
Creatinine, Ser: 5.83 mg/dL — ABNORMAL HIGH (ref 0.50–1.35)
GFR calc Af Amer: 12 mL/min — ABNORMAL LOW (ref >90)
GFR calc non Af Amer: 11 mL/min — ABNORMAL LOW (ref >90)
GLUCOSE: 100 mg/dL — AB (ref 70–99)
POTASSIUM: 3.9 meq/L (ref 3.7–5.3)
Phosphorus: 6.8 mg/dL — ABNORMAL HIGH (ref 2.3–4.6)
Sodium: 138 mEq/L (ref 137–147)

## 2014-08-19 LAB — CULTURE, BLOOD (ROUTINE X 2)

## 2014-08-19 LAB — CK: Total CK: 18 U/L (ref 7–232)

## 2014-08-19 MED ORDER — LIDOCAINE HCL (PF) 1 % IJ SOLN
5.0000 mL | INTRAMUSCULAR | Status: DC | PRN
Start: 2014-08-19 — End: 2014-08-20

## 2014-08-19 MED ORDER — PENTAFLUOROPROP-TETRAFLUOROETH EX AERO: 1.0000 "application " | INHALATION_SPRAY | CUTANEOUS | Status: DC | PRN

## 2014-08-19 MED ORDER — LIDOCAINE-PRILOCAINE 2.5-2.5 % EX CREA
1.0000 | TOPICAL_CREAM | CUTANEOUS | Status: DC | PRN
Start: 2014-08-19 — End: 2014-08-20
  Filled 2014-08-19: qty 5

## 2014-08-19 MED ORDER — ALTEPLASE 2 MG IJ SOLR
2.0000 mg | Freq: Once | INTRAMUSCULAR | Status: AC | PRN
  Filled 2014-08-19: qty 2

## 2014-08-19 MED ORDER — SODIUM CHLORIDE 0.9 % IV SOLN: 100.0000 mL | INTRAVENOUS | Status: DC | PRN

## 2014-08-19 MED ORDER — NEPRO/CARBSTEADY PO LIQD
237.0000 mL | ORAL | Status: DC | PRN
Start: 2014-08-19 — End: 2014-08-20
  Filled 2014-08-19: qty 237

## 2014-08-19 MED ORDER — HEPARIN SODIUM (PORCINE) 1000 UNIT/ML DIALYSIS
1000.0000 [IU] | INTRAMUSCULAR | Status: DC | PRN
  Filled 2014-08-19: qty 1

## 2014-08-19 MED ORDER — SODIUM CHLORIDE 0.9 % IV SOLN
100.0000 mL | INTRAVENOUS | Status: DC | PRN
Start: 2014-08-19 — End: 2014-08-20

## 2014-08-19 NOTE — Progress Notes (Signed)
[facility] ROUNDING NOTE   Subjective:   Interval History: planned dialysis    Objective:  Vital signs in last 24 hours:  Temp:  [97.7 F (36.5 C)-98.4 F (36.9 C)] 97.7 F (36.5 C) (10/01 0459) Pulse Rate:  [97-103] 103 (10/01 0459) Resp:  [18] 18 (10/01 0459) BP: (131-154)/(43-62) 136/51 mmHg (10/01 0459) SpO2:  [98 %-99 %] 99 % (10/01 0459) Weight:  [183.707 kg (405 lb)] 183.707 kg (405 lb) (10/01 0459)  Weight change: 2.223 kg (4 lb 14.4 oz) Filed Weights   08/17/14 0527 08/18/14 0453 08/19/14 0459  Weight: 181.983 kg (401 lb 3.2 oz) 181.484 kg (400 lb 1.6 oz) 183.707 kg (405 lb)    Intake/Output: I/O last 3 completed shifts: In: 940 [P.O.:390; I.V.:50; IV Piggyback:500] Out: -    Intake/Output this shift:     General: Morbidly obese male, alert, cooperative, NAD. Chronically ill-appearing.  HEENT: PERRL, EOMI. Moist mucus membranes.  Neck: Full range of motion without pain, supple, no lymphadenopathy or carotid bruits. RIJ HD catheter in place.  Lungs: Air entry equal bilaterally, rales at bases. No wheezes. On room air this AM.  Heart: Regular rate and rhythm, 3/6 combined systolic/diastolic murmur heard, loudest at left upper sternal border. No rubs or gallops.  Abdomen: Obese, non-tender, non-distended. BS present.  Extremities: RLE w/ severe swelling, erythema, and venous stasis changes. Foot w/ large overlying scab/chronic skin changes on dorsum of the foot. Leg w/ large overlying eschar on medial surface. Underlying skin erythematous, edematous. LLE w/ BKA.  Neurologic: Alert & oriented x3, cranial nerves grossly intact, strength grossly intact. Depressed mood, flat affect    Basic Metabolic Panel:  Recent Labs Lab 08/12/14 1205  08/15/14 0939 08/16/14 0418 08/17/14 1051 08/18/14 0520 08/19/14 0436  NA 142  < > 137 136* 136* 137 138  K 5.5*  < > 4.5 4.6 4.6 3.9 3.9  CL 101  < > 96 96 94* 97 96  CO2 23  < > 23 21 22 26 24   GLUCOSE  211*  < > 156* 124* 144* 126* 100*  BUN 83*  < > 43* 49* 59* 41* 49*  CREATININE 6.26*  < > 4.43* 5.11* 6.20* 5.01* 5.83*  CALCIUM 8.1*  < > 7.9* 8.1* 8.1* 7.9* 8.1*  MG 2.1  --   --   --   --   --   --   PHOS  --   < > 5.2* 6.2* 8.1* 6.3* 6.8*  < > = values in this interval not displayed.  Liver Function Tests:  Recent Labs Lab 08/12/14 1205  08/15/14 0939 08/16/14 0418 08/17/14 1051 08/18/14 0520 08/19/14 0436  AST 78*  --   --   --   --   --   --   ALT 52  --   --   --   --   --   --   ALKPHOS 126*  --   --   --   --   --   --   BILITOT 1.4*  --   --   --   --   --   --   PROT 6.3  --   --   --   --   --   --   ALBUMIN 2.1*  < > 2.1* 2.2* 2.2* 2.1* 2.1*  < > = values in this interval not displayed. No results found for this basename: LIPASE, AMYLASE,  in the last 168 hours No results found for  this basename: AMMONIA,  in the last 168 hours  CBC:  Recent Labs Lab 08/13/14 1635  08/15/14 0939 08/16/14 0418 08/17/14 1051 08/18/14 0520 08/19/14 0436  WBC 21.4*  < > 18.8* 17.7* 15.4* 14.3* 18.0*  NEUTROABS 17.9*  --  15.6* 14.6*  --   --   --   HGB 6.8*  < > 8.3* 8.3* 8.9* 8.3* 8.5*  HCT 20.6*  < > 25.5* 25.7* 27.4* 26.1* 26.5*  MCV 84.4  < > 87.0 87.4 90.4 91.9 90.1  PLT 251  < > 193 202 171 143* 129*  < > = values in this interval not displayed.  Cardiac Enzymes:  Recent Labs Lab 08/12/14 1649 08/19/14 0436  CKTOTAL 27 18    BNP: No components found with this basename: POCBNP,   CBG:  Recent Labs Lab 08/18/14 0734 08/18/14 1138 08/18/14 1659 08/18/14 1950 08/19/14 0737  GLUCAP 98 126* 101* 86 85    Microbiology: Results for orders placed during the hospital encounter of 08/11/14  CULTURE, BLOOD (ROUTINE X 2)     Status: None   Collection Time    08/11/14  8:43 PM      Result Value Ref Range Status   Specimen Description BLOOD LEFT HAND   Final   Special Requests BOTTLES DRAWN AEROBIC AND ANAEROBIC 10CC EACH   Final   Culture  Setup Time      Final   Value: 08/12/2014 03:41     Performed at [facility]   Culture     Final   Value: GRAM POSITIVE COCCI IN CLUSTERS     Note: Culture results may be compromised due to an excessive volume of blood received in culture bottles. Gram Stain Report Called to,Read Back By and Verified With: Jannette SpannerELAINA M RN ON 09.25.15 AT 1938 BY HENDJ     Performed at [facility]   Report Status 08/15/2014 FINAL   Final  CULTURE, BLOOD (ROUTINE X 2)     Status: None   Collection Time    08/11/14  9:00 PM      Result Value Ref Range Status   Specimen Description BLOOD LEFT HAND   Final   Special Requests BOTTLES DRAWN AEROBIC ONLY 5CC   Final   Culture  Setup Time     Final   Value: 08/12/2014 03:38     Performed at [facility]   Culture     Final   Value: METHICILLIN RESISTANT STAPHYLOCOCCUS AUREUS     Note: RIFAMPIN AND GENTAMICIN SHOULD NOT BE USED AS SINGLE DRUGS FOR TREATMENT OF STAPH INFECTIONS. CRITICAL RESULT CALLED TO, READ BACK BY AND VERIFIED WITH: JANET [facility] 08/15/14 @ 10:10AM BY RUSCOE A. DAPTOMYCIN SENSITIVE <=1ug/mL     Note: Gram Stain Report Called to,Read Back By and Verified With: ELENA MOSQUEDA @ 1038 ON [id number] BY [facility]     Performed at [facility]   Report Status 08/19/2014 FINAL   Final   Organism ID, Bacteria METHICILLIN RESISTANT STAPHYLOCOCCUS AUREUS   Final  URINE CULTURE     Status: None   Collection Time    08/12/14  6:00 PM      Result Value Ref Range Status   Specimen Description URINE, RANDOM   Final   Special Requests NONE   Final   Culture  Setup Time     Final   Value: 08/12/2014 20:13     Performed at [facility]   Colony Count  Final   Value: NO GROWTH     Performed at [facility]   Culture     Final   Value: NO GROWTH     Performed at [facility]   Report Status 08/13/2014 FINAL   Final  CULTURE, BLOOD (ROUTINE X 2)     Status: None   Collection Time    08/17/14  8:00 PM      Result Value  Ref Range Status   Specimen Description BLOOD LEFT HAND   Final   Special Requests BOTTLES DRAWN AEROBIC AND ANAEROBIC 5CC   Final   Culture  Setup Time     Final   Value: 08/18/2014 00:44     Performed at [facility]   Culture     Final   Value:        BLOOD CULTURE RECEIVED NO GROWTH TO DATE CULTURE WILL BE HELD FOR 5 DAYS BEFORE ISSUING A FINAL NEGATIVE REPORT     Performed at [facility]   Report Status PENDING   Incomplete    Coagulation Studies: No results found for this basename: LABPROT, INR,  in the last 72 hours  Urinalysis: No results found for this basename: COLORURINE, APPERANCEUR, LABSPEC, PHURINE, GLUCOSEU, HGBUR, BILIRUBINUR, KETONESUR, PROTEINUR, UROBILINOGEN, NITRITE, LEUKOCYTESUR,  in the last 72 hours    Imaging: No results found.   Medications:     . sodium chloride   Intravenous Once  . ARIPiprazole  5 mg Oral Daily  . calcium acetate  667 mg Oral TID WC  . DAPTOmycin (CUBICIN)  IV  1,000 mg Intravenous Q48H  . feeding supplement (GLUCERNA SHAKE)  237 mL Oral TID BM  . feeding supplement (PRO-STAT SUGAR FREE 64)  30 mL Oral BID  . hydrocerin  1 application Topical Daily  . insulin aspart  0-5 Units Subcutaneous QHS  . insulin aspart  0-9 Units Subcutaneous TID WC  . insulin detemir  15 Units Subcutaneous QHS  . linezolid  600 mg Intravenous Q12H  . LORazepam  2 mg Intravenous Once  . rifampin (RIFADIN) IVPB  300 mg Intravenous 3 times per day  . sodium chloride  3 mL Intravenous Q12H   acetaminophen, acetaminophen, albuterol, calcium carbonate (dosed in mg elemental calcium), camphor-menthol, docusate sodium, feeding supplement (NEPRO CARB STEADY), hydrOXYzine, LORazepam, ondansetron (ZOFRAN) IV, ondansetron, oxyCODONE, sodium chloride, sorbitol, traMADol, zolpidem  Assessment/ Plan:  43 y/o M w/ multiple co-morbidities, most significant for LE chronic venous stasis changes and RLE leg infection, RLE BKA, DM type II, CKD stage 5,  transferred from [facility] w/ MRSA bacteremia, found to have large aortic vegetation on TEE. Also w/ worsening renal failure requiring HD via temporary left femoral HD catheter.   CKD stage 5-  Tolerated HD well yesterday, out ~1.8L. No w/ RIJ temporary HD catheter. Not currently a candidate for permanent catheter placement given his ongoing bacteremia. Will give HD again today MRSA bacteremia- Had previously been treated for MRSA bacteremia in 06/2014 w/ 4 week course of Vancomycin. Repeat blood cultures from 08/11/14 positive for MRSA. Currently on Linezolid, Daptomycin, and Rifampin. Leukocytosis slowly improving, now 14.3. Blood cultures repeated yesterday.  Aortic Valve Infective Endocarditis- TEE at [place] from 08/11/14 shows large 1 cm vegetation on the aortic valve. Not surgical candidate. Palliative Care following as well.  RLE venous stasis ulcers- Seen by orthodo not feel patient requires amputation at this time as they feel leg is not currently infected.  H/o DVT- On Eliquis at  home, also w/ IVC filter.  Normocytic Anemia- Baseline Hb 9. 8.3 this AM. Continue to monitor.  Hyperphosphatemia- Phos improved to 6.3 this AM. Continue Phoslo 667 mg po tid (started on 08/16/14).         LOS: 8 Tyshell Ramberg W @TODAY @11 :15 AM

## 2014-08-19 NOTE — Procedures (Signed)
I have personally attended this patient's dialysis session.  Seen at end of treatment, with net UF of 3 liters  No issues during treatment  Camille Balynthia Chariah Bailey, MD [facility] [phone] Pager 08/19/2014, 5:27 PM

## 2014-08-19 NOTE — Progress Notes (Signed)
Patient Name: Angel Mays Date of Encounter: 08/19/2014  Principal Problem:   Endocarditis of aortic valve Active Problems:   Acute encephalopathy   CKD (chronic kidney disease)   Leg ulcer   Diabetes mellitus   HTN (hypertension)   History of DVT (deep vein thrombosis)   Infectious aortic insufficiency   Bacteremia   DNR (do not resuscitate) discussion   Weakness generalized   Palliative care encounter    Patient Profile: 43 yo male w/ hx DM, L-BKA, HTN, MRSA sepsis 06/2014, hx DVT w/ IVC filter, chronic RLE venous stasis ulcers (no current infection per ortho), CKD on HD (initially felt temporary). [facility] & trop[facility], peak 1.30. 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 [facility] TCTS has seen, not a surgical candidate. Palliative care has seen, but family wants aggressive care.  Cards following.  SUBJECTIVE: More awake today, says feels a little better, denies chest pain or SOB.  OBJECTIVE Filed Vitals:   08/18/14 1126 08/18/14 1457 08/18/14 1951 08/19/14 0459  BP: 136/43 131/62 154/53 136/51  Pulse: 97 98 102 103  Temp: 98.4 F (36.9 C) 98.1 F (36.7 C) 98.4 F (36.9 C) 97.7 F (36.5 C)  TempSrc: Oral Oral Oral Oral  Resp: 18  18 18   Height:      Weight:    405 lb (183.707 kg)  SpO2: 98% 99% 98% 99%    Intake/Output Summary (Last 24 hours) at 08/19/14 0747 Last data filed at 08/19/14 0500  Gross per 24 hour  Intake    940 ml  Output      0 ml  Net    940 ml   Filed Weights   08/17/14 0527 08/18/14 0453 08/19/14 0459  Weight: 401 lb 3.2 oz (181.983 kg) 400 lb 1.6 oz (181.484 kg) 405 lb (183.707 kg)    PHYSICAL EXAM General: Well developed, well nourished, male in no acute distress. Head: Normocephalic, atraumatic.  Neck: Supple without bruits, JVD 8-9 cm. Lungs:  Resp regular and unlabored, rales bases. Heart: RRR, S1, S2, no S3, S4, + murmur; ? rub. Abdomen: Soft, non-tender, non-distended, BS + x  4.  Extremities: No clubbing, cyanosis, no edema.  Neuro: Alert and oriented X 2. Moves all extremities spontaneously. Psych: Normal affect.  LABS: CBC: Recent Labs  08/18/14 0520 08/19/14 0436  WBC 14.3* 18.0*  HGB 8.3* 8.5*  HCT 26.1* 26.5*  MCV 91.9 90.1  PLT 143* 129*   Basic Metabolic Panel: Recent Labs  08/18/14 0520 08/19/14 0436  NA 137 138  K 3.9 3.9  CL 97 96  CO2 26 24  GLUCOSE 126* 100*  BUN 41* 49*  CREATININE 5.01* 5.83*  CALCIUM 7.9* 8.1*  PHOS 6.3* 6.8*   Liver Function Tests: Recent Labs  08/18/14 0520 08/19/14 0436  ALBUMIN 2.1* 2.1*   Cardiac Enzymes: Recent Labs  08/19/14 0436  CKTOTAL 18   TELE: Generally ST, SR, brief burst of ?afib       Radiology/Studies: Ir Fluoro Guide Cv Line Right 08/17/2014   CLINICAL DATA:  Patient for temporary HD catheter to resume HD on 08/17/14; temp  TECHNIQUE: RIGHT IJ CATHETER PLACEMENT UNDER ULTRASOUND AND FLUOROSCOPIC GUIDANCE  The procedure, risks (including but not limited to bleeding, infection, organ damage, pneumothorax), benefits, and alternatives were explained to the patient. Questions regarding the procedure were encouraged and answered. The patient understands and consents to the procedure. Patency of the right IJ vein was confirmed  with ultrasound with image documentation. An appropriate skin site was determined. Skin site was marked. Region was prepped using maximum barrier technique including cap and mask, sterile gown, sterile gloves, large sterile sheet, and Chlorhexidine as cutaneous antisepsis. The region was infiltrated locally with 1% lidocaine. Under real-time ultrasound guidance, the right IJ vein was accessed with a 19 gauge needle; the needle tip within the vein was confirmed with ultrasound image documentation. The needle exchanged over a guidewire for vascular dilator which allowed advancement of a 20 cm Trialysis catheter. This was positioned with the tip at the cavoatrial junction. Spot  chest radiograph shows good positioning and no pneumothorax. Catheter was flushed and sutured externally with 0-Prolene sutures. Patient tolerated the procedure well, with no immediate complication.  IMPRESSION: 1. Technically successful right IJ Trialysis catheter placement.   Electronically Signed   By: Oley Balm M.D.   On: 08/17/2014 14:46     Current Medications:  . sodium chloride   Intravenous Once  . ARIPiprazole  5 mg Oral Daily  . calcium acetate  667 mg Oral TID WC  . DAPTOmycin (CUBICIN)  IV  1,000 mg Intravenous Q48H  . feeding supplement (GLUCERNA SHAKE)  237 mL Oral TID BM  . feeding supplement (PRO-STAT SUGAR FREE 64)  30 mL Oral BID  . hydrocerin  1 application Topical Daily  . insulin aspart  0-5 Units Subcutaneous QHS  . insulin aspart  0-9 Units Subcutaneous TID WC  . insulin detemir  15 Units Subcutaneous QHS  . linezolid  600 mg Intravenous Q12H  . LORazepam  2 mg Intravenous Once  . rifampin (RIFADIN) IVPB  300 mg Intravenous 3 times per day  . sodium chloride  3 mL Intravenous Q12H      ASSESSMENT AND PLAN: Principal Problem:   Endocarditis of aortic valve - ABX per IM/ID, TCTS has seen. No currently a surgical candidate due to comorbidities. Dr. Ninetta Lights states IVC could be infected but does not recommend removal at this time. Repeat blood cx pending.  Ortho has seen, RLE with ulcers, not currently infected. Wound care recs made.  Currently tolerating HD  ?Atrial fib - Short burst seen overnight, BP/HR currently high enough to tolerate BB but SBP 90s on 09/29. Med changes per MD. Not currently anticoagulated.   Otherwise, per IM, ID, Renal, Ortho  Active Problems:   Acute encephalopathy   CKD (chronic kidney disease)   Leg ulcer   Diabetes mellitus   HTN (hypertension)   History of DVT (deep vein thrombosis)   Infectious aortic insufficiency   Bacteremia   DNR (do not resuscitate) discussion   Weakness generalized   Palliative care  encounter   Melida Quitter , PA-C 7:47 AM 08/19/2014

## 2014-08-19 NOTE — Progress Notes (Signed)
Utilization review completed.  

## 2014-08-19 NOTE — Progress Notes (Signed)
NUTRITION FOLLOW UP  DOCUMENTATION CODES  Per approved criteria   -Morbid Obesity  -Severe Malnutrition in Chronic illness     Pt meets criteria for severe MALNUTRITION in the context of chronic illness as evidenced by 21% weight loss in <1 year and reported intake <75% of estimated needs for >1 month.   Intervention:    Continue Glucerna Shake po TID (each supplement provides 220 kcal and 10 grams of protein) and Prostat BID (each supplement provides 100 kcal and 15 g protein).  If oral intake does not improve, consider placing a feeding tube. Current intake meets a maximum of 35% of estimated needs.  Nutrition Dx:   Inadequate oral intake related to aortic valvular endocarditis as evidenced by wt loss. Ongoing.  Goal:   Intake to meet >90% of estimated nutrition needs. Unmet.  Monitor:   PO intake, labs, weight trend.  Assessment:   43 year old male with a very complicated past medical history including diabetes, hypertension, morbid obesity, chronic kidney disease, and left below-knee amputation.   Patient continues with very poor oral intake of meals, only consuming bites of meals. Is drinking Glucerna supplement from home supply, brought in by wife. Palliative Care Team is following. Patient is receiving intermittent dialysis, not a candidate for permanent HD access due to ongoing bacteremia. Current intake is not adequate.  Height: Ht Readings from Last 1 Encounters:  08/11/14 6\' 9"  (2.057 m)    Weight Status:   Wt Readings from Last 1 Encounters:  08/19/14 405 lb 13.9 oz (184.1 kg)  08/13/14  387 lb 1.6 oz (175.587 kg)   Re-estimated needs:  Kcal: 2450-2700  Protein: 130-140 g  Fluid: Per MD  Skin: incision on sacrum, wound on right leg  Diet Order: Renal   Intake/Output Summary (Last 24 hours) at 08/19/14 1547 Last data filed at 08/19/14 0500  Gross per 24 hour  Intake    120 ml  Output      0 ml  Net    120 ml    Last BM: 9/28   Labs:   Recent  Labs Lab 08/17/14 1051 08/18/14 0520 08/19/14 0436  NA 136* 137 138  K 4.6 3.9 3.9  CL 94* 97 96  CO2 22 26 24   BUN 59* 41* 49*  CREATININE 6.20* 5.01* 5.83*  CALCIUM 8.1* 7.9* 8.1*  PHOS 8.1* 6.3* 6.8*  GLUCOSE 144* 126* 100*    CBG (last 3)   Recent Labs  08/18/14 1950 08/19/14 0737 08/19/14 1133  GLUCAP 86 85 138*    Scheduled Meds: . sodium chloride   Intravenous Once  . ARIPiprazole  5 mg Oral Daily  . calcium acetate  667 mg Oral TID WC  . DAPTOmycin (CUBICIN)  IV  1,000 mg Intravenous Q48H  . feeding supplement (GLUCERNA SHAKE)  237 mL Oral TID BM  . feeding supplement (PRO-STAT SUGAR FREE 64)  30 mL Oral BID  . hydrocerin  1 application Topical Daily  . insulin aspart  0-5 Units Subcutaneous QHS  . insulin aspart  0-9 Units Subcutaneous TID WC  . insulin detemir  15 Units Subcutaneous QHS  . linezolid  600 mg Intravenous Q12H  . LORazepam  2 mg Intravenous Once  . rifampin (RIFADIN) IVPB  300 mg Intravenous 3 times per day  . sodium chloride  3 mL Intravenous Q12H    Continuous Infusions:   Joaquin CourtsKimberly Charleton Deyoung, RD, LDN, CNSC Pager [phone] After Hours Pager [phone]

## 2014-08-19 NOTE — Progress Notes (Addendum)
Clinical status about the same. No new recommendations. If A fib becomes a problem, may need Amio. Not a good anticoag candidate.

## 2014-08-19 NOTE — Progress Notes (Signed)
Progress Note [facility]  Subjective:  -continued conversation regarding GOC, treatment options and anticipatory care needs.  -discussed consensus (medical team) that overall  prognosis is poor and treatment options are limited    -patient and family verbalize adamantly that they want all offered and available medical interventions to prolong life  -we also discussed possible disposition options    Objective: Allergies  Allergen Reactions  . Omnipaque [Iohexol]    Scheduled Meds: . sodium chloride   Intravenous Once  . ARIPiprazole  5 mg Oral Daily  . calcium acetate  667 mg Oral TID WC  . DAPTOmycin (CUBICIN)  IV  1,000 mg Intravenous Q48H  . feeding supplement (GLUCERNA SHAKE)  237 mL Oral TID BM  . feeding supplement (PRO-STAT SUGAR FREE 64)  30 mL Oral BID  . hydrocerin  1 application Topical Daily  . insulin aspart  0-5 Units Subcutaneous QHS  . insulin aspart  0-9 Units Subcutaneous TID WC  . insulin detemir  15 Units Subcutaneous QHS  . linezolid  600 mg Intravenous Q12H  . LORazepam  2 mg Intravenous Once  . rifampin (RIFADIN) IVPB  300 mg Intravenous 3 times per day  . sodium chloride  3 mL Intravenous Q12H   Continuous Infusions:  PRN Meds:.sodium chloride, sodium chloride, acetaminophen, acetaminophen, albuterol, alteplase, calcium carbonate (dosed in mg elemental calcium), camphor-menthol, docusate sodium, feeding supplement (NEPRO CARB STEADY), feeding supplement (NEPRO CARB STEADY), heparin, hydrOXYzine, lidocaine (PF), lidocaine-prilocaine, LORazepam, ondansetron (ZOFRAN) IV, ondansetron, oxyCODONE, pentafluoroprop-tetrafluoroeth, sodium chloride, sorbitol, traMADol, zolpidem  BP 146/60  Pulse 96  Temp(Src) 98.6 F (37 C) (Oral)  Resp 16  Ht 6\' 9"  (2.057 m)  Wt 184.1 kg (405 lb 13.9 oz)  BMI 43.51 kg/m2  SpO2 98%   PPS:30 % at best  Pain Score:denies presetnly Pain Location  Right leg   Intake/Output Summary (Last  24 hours) at 08/19/14 1453 Last data filed at 08/19/14 0500  Gross per 24 hour  Intake    160 ml  Output      0 ml  Net    160 ml       Physical Exam:  General: Ill appearing, NAD HEENT:  Moist buccal membranes, no exudate Chest:  Diminished in bases, CTA CVS: tachycardic Abdomen: obese, soft +BS Ext: LBKA, RLE with noted skin changes RT Neuro: weakened, decreased muscle strength (unable to reposition self)  Labs: CBC    Component Value Date/Time   WBC 18.0* 08/19/2014 0436   RBC 2.94* 08/19/2014 0436   RBC 2.70* 01/22/2014 2130   HGB 8.5* 08/19/2014 0436   HCT 26.5* 08/19/2014 0436   PLT 129* 08/19/2014 0436   MCV 90.1 08/19/2014 0436   MCH 28.9 08/19/2014 0436   MCHC 32.1 08/19/2014 0436   RDW 20.7* 08/19/2014 0436   LYMPHSABS 1.4 08/16/2014 0418   MONOABS 1.5* 08/16/2014 0418   EOSABS 0.1 08/16/2014 0418   BASOSABS 0.1 08/16/2014 0418    BMET    Component Value Date/Time   NA 138 08/19/2014 0436   K 3.9 08/19/2014 0436   CL 96 08/19/2014 0436   CO2 24 08/19/2014 0436   GLUCOSE 100* 08/19/2014 0436   BUN 49* 08/19/2014 0436   CREATININE 5.83* 08/19/2014 0436   CALCIUM 8.1* 08/19/2014 0436   CALCIUM 8.6 02/03/2014 1915   GFRNONAA 11* 08/19/2014 0436   GFRAA 12* 08/19/2014 0436    CMP     Component Value Date/Time   NA 138 08/19/2014  0436   K 3.9 08/19/2014 0436   CL 96 08/19/2014 0436   CO2 24 08/19/2014 0436   GLUCOSE 100* 08/19/2014 0436   BUN 49* 08/19/2014 0436   CREATININE 5.83* 08/19/2014 0436   CALCIUM 8.1* 08/19/2014 0436   CALCIUM 8.6 02/03/2014 1915   PROT 6.3 08/12/2014 1205   ALBUMIN 2.1* 08/19/2014 0436   AST 78* 08/12/2014 1205   ALT 52 08/12/2014 1205   ALKPHOS 126* 08/12/2014 1205   BILITOT 1.4* 08/12/2014 1205   GFRNONAA 11* 08/19/2014 0436   GFRAA 12* 08/19/2014 0436    Assessment and Plan: 1. Code Status:Full code 2. Symptom Control:       Weakness: continue medical management of chronic disease            PT/IOT as tolerated  3. Psycho/Social: Emotional  support offered to patient and family (wife and neice) at bedside.  It is difficult for all to face the realization of poor prognosis and limited medical options 4. Spiritual: chaplain involved Disposition:  Dependant on outcomes.  Family now is inquiring about discharge options.  Wife expresses interest in [facility]    Patient Documents Completed or Given: Document Given Completed  Advanced Directives Pkt    MOST    DNR discussed   Gone from My Sight    Hard Choices yes     Time In Time Out Total Time Spent with Patient Total Overall Time  1000 1045 45 min 45 min    Greater than 50%  of this time was spent counseling and coordinating care related to the above assessment and plan.  Lorinda Creed NP  Palliative Medicine Team Team Phone # [phone] Pager [phone]  1

## 2014-08-19 NOTE — Progress Notes (Addendum)
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  -Questionable etiology. Infection may be secondary to ulcer on the lower extremity which may have seeded.  -Patient has been seen by cardiology and cardiothoracic surgery and patient deemed not operative candidate.  -MRI head pending as per wife patient with memory issues. MRI was attempted and patient given about 4 mg of IV Ativan however unable to be done. Patient at this point in time is refusing MRI under sedation and as such cancelled MRI.  -Continue empiric IV Cubicin and IV Zyvox and rifampin for now. CVTS, ID, cardiology following and appreciate input and recommendations.  -Per cardiology and cardiothoracic surgery patient has a poor prognosis.  -Family wants aggressive care.  -repeat Blood culture 9-29 no growth to date.   2 MRSA bacteremia Repeat Blood cx with MRSA. Continue empiric IV Cubicin and IV Zyvox, and Rifampin. ID following and appreciate input and recommendations. Blood culture 9-29 no growth to date,.   3 right lower extremity wound/infection Continue current IV antibiotics. Patient has been seen by wound care, and recommendations made. X-rays of the right lower extremity negative for osteomyelitis. Orthopedics consulted  for further evaluation. Per ortho ulcers does not appears to be infected. No further aggressive treatment indicated to the wounds  4 well controlled DM2 Continue home regimen of long-acting insulin. Sliding scale insulin.  5 chronic kidney disease stage IV/5 Per wife patient has been temporarily on hemodialysis while in the hospital at [facility] which was started 08/09/2014. Patient with poor urinary output. Patient has been seen by renal and patient underwent HD. Femoral HD catheter removed. Right IJ catheter has been placed for hemodialysis. Per renal.   Planning dialysis today. Unable to place permanent access at this time due bacteremia.    6 anemia The admission H&P patient is status post 5 units packed red blood cells at [place]. Follow H&H. Hemoglobin currently at 8.9. Transfusion threshhold Hgb <7.   7 history of DVT Patient was on Elliquis, however this was discontinue  secondary to anemia and chronic kidney disease. Patient is status post IVC filter. ?? Infected, remove IVC, defer to ID.  8 prophylaxis PPI for GI prophylaxis. SCDs for DVT prophylaxis.  9 Leukocytosis: trending up. WBC fluctuates.  10-Diarrhea; report 2 watery BM. Will check for C diff.     prognosis Patient with a very poor prognosis. Patient is presenting with aortic valvular endocarditis, MRSA bacteremia, chronic kidney disease stage IV/5 currently on HD started 08/09/14 at [facility], right lower extremity wound infection, type II diabetic, morbid obesity, chronic lower extremity venostasis and also noted to be anemic. Patient deemed not a surgical candidate. Family want aggressive measures.   Swelling stump; refuse doppler. Wife aware and agree with patient.   Code Status: Full Family Communication: Updated patient and wife at bedside.  Disposition Plan; will ask LTAC for evaluation.    Consultants:  ID: Dr. Ninetta Lights 08/12/2014  Nephrology: Dr.Powell 08/12/2014  Cardiothoracic surgery Dr. Dorris Fetch 08/11/2014  Cardiology: Dr. Rennis Golden 08/11/2014. Dr Katrinka Blazing   Procedures:  Chest x-ray 08/12/2014  TEE 08/11/2014  X-ray of the right lower extremity 08/13/2014  Right IJ trialysis catheter placement 08/17/2049  Antibiotics: Total days of antibiotics day 8  IV daptomycin 08/12/2014  IV Zyvox 08/11/2014  Oral rifampin 08/12/2014  HPI/Subjective: Patient relates pain is better controlled. He had 2 watery BM.Marland Kitchen  Patient denies double vision.   Objective: Filed Vitals:  08/19/14 1600  BP: 125/56  Pulse: 101  Temp:   Resp: 19     Intake/Output Summary (Last 24 hours) at 08/19/14 1627 Last data filed at 08/19/14 0500  Gross per 24 hour  Intake    120 ml  Output      0 ml  Net    120 ml   Filed Weights   08/18/14 0453 08/19/14 0459 08/19/14 1323  Weight: 181.484 kg (400 lb 1.6 oz) 183.707 kg (405 lb) 184.1 kg (405 lb 13.9 oz)    Exam:   General:  NAD  Cardiovascular: RRR  Respiratory: CTAB  Abdomen: Soft, nontender, nondistended, positive bowel sounds.  Musculoskeletal: Right leg with chronic venous stasis changes and ulcer. Left stump with swelling, chronic skin changes. Hyperpigmentation.   Data Reviewed: Basic Metabolic Panel:  Recent Labs Lab 08/15/14 0939 08/16/14 0418 08/17/14 1051 08/18/14 0520 08/19/14 0436  NA 137 136* 136* 137 138  K 4.5 4.6 4.6 3.9 3.9  CL 96 96 94* 97 96  CO2 23 21 22 26 24   GLUCOSE 156* 124* 144* 126* 100*  BUN 43* 49* 59* 41* 49*  CREATININE 4.43* 5.11* 6.20* 5.01* 5.83*  CALCIUM 7.9* 8.1* 8.1* 7.9* 8.1*  PHOS 5.2* 6.2* 8.1* 6.3* 6.8*   Liver Function Tests:  Recent Labs Lab 08/15/14 0939 08/16/14 0418 08/17/14 1051 08/18/14 0520 08/19/14 0436  ALBUMIN 2.1* 2.2* 2.2* 2.1* 2.1*   No results found for this basename: LIPASE, AMYLASE,  in the last 168 hours No results found for this basename: AMMONIA,  in the last 168 hours CBC:  Recent Labs Lab 08/13/14 1635  08/15/14 0939 08/16/14 0418 08/17/14 1051 08/18/14 0520 08/19/14 0436  WBC 21.4*  < > 18.8* 17.7* 15.4* 14.3* 18.0*  NEUTROABS 17.9*  --  15.6* 14.6*  --   --   --   HGB 6.8*  < > 8.3* 8.3* 8.9* 8.3* 8.5*  HCT 20.6*  < > 25.5* 25.7* 27.4* 26.1* 26.5*  MCV 84.4  < > 87.0 87.4 90.4 91.9 90.1  PLT 251  < > 193 202 171 143* 129*  < > = values in this interval not displayed. Cardiac Enzymes:  Recent Labs Lab 08/12/14 1649 08/19/14 0436  CKTOTAL 27 18   BNP (last 3 results) No results found for this basename: PROBNP,  in the last 8760 hours CBG:  Recent Labs Lab  08/18/14 1138 08/18/14 1659 08/18/14 1950 08/19/14 0737 08/19/14 1133  GLUCAP 126* 101* 86 85 138*    Recent Results (from the past 240 hour(s))  CULTURE, BLOOD (ROUTINE X 2)     Status: None   Collection Time    08/11/14  8:43 PM      Result Value Ref Range Status   Specimen Description BLOOD LEFT HAND   Final   Special Requests BOTTLES DRAWN AEROBIC AND ANAEROBIC 10CC EACH   Final   Culture  Setup Time     Final   Value: 08/12/2014 03:41     Performed at [facility]   Culture     Final   Value: GRAM POSITIVE COCCI IN CLUSTERS     Note: Culture results may be compromised due to an excessive volume of blood received in culture bottles. Gram Stain Report Called to,Read Back By and Verified With: Jannette Spanner RN ON 09.25.15 AT 1938 BY HENDJ     Performed at [facility]   Report Status 08/15/2014 FINAL   Final  CULTURE, BLOOD (ROUTINE X 2)  Status: None   Collection Time    08/11/14  9:00 PM      Result Value Ref Range Status   Specimen Description BLOOD LEFT HAND   Final   Special Requests BOTTLES DRAWN AEROBIC ONLY 5CC   Final   Culture  Setup Time     Final   Value: 08/12/2014 03:38     Performed at [facility]   Culture     Final   Value: METHICILLIN RESISTANT STAPHYLOCOCCUS AUREUS     Note: RIFAMPIN AND GENTAMICIN SHOULD NOT BE USED AS SINGLE DRUGS FOR TREATMENT OF STAPH INFECTIONS. CRITICAL RESULT CALLED TO, READ BACK BY AND VERIFIED WITH: JANET [facility] 08/15/14 @ 10:10AM BY RUSCOE A. DAPTOMYCIN SENSITIVE <=1ug/mL     Note: Gram Stain Report Called to,Read Back By and Verified With: ELENA MOSQUEDA @ 1038 ON [id number] BY [facility]     Performed at [facility]   Report Status 08/19/2014 FINAL   Final   Organism ID, Bacteria METHICILLIN RESISTANT STAPHYLOCOCCUS AUREUS   Final  URINE CULTURE     Status: None   Collection Time    08/12/14  6:00 PM      Result Value Ref Range Status   Specimen Description URINE, RANDOM   Final   Special Requests  NONE   Final   Culture  Setup Time     Final   Value: 08/12/2014 20:13     Performed at [facility] Count     Final   Value: NO GROWTH     Performed at [facility]   Culture     Final   Value: NO GROWTH     Performed at [facility]   Report Status 08/13/2014 FINAL   Final  CULTURE, BLOOD (ROUTINE X 2)     Status: None   Collection Time    08/17/14  8:00 PM      Result Value Ref Range Status   Specimen Description BLOOD LEFT HAND   Final   Special Requests BOTTLES DRAWN AEROBIC AND ANAEROBIC 5CC   Final   Culture  Setup Time     Final   Value: 08/18/2014 00:44     Performed at [facility]   Culture     Final   Value:        BLOOD CULTURE RECEIVED NO GROWTH TO DATE CULTURE WILL BE HELD FOR 5 DAYS BEFORE ISSUING A FINAL NEGATIVE REPORT     Performed at [facility]   Report Status PENDING   Incomplete     Studies: No results found.  Scheduled Meds: . sodium chloride   Intravenous Once  . ARIPiprazole  5 mg Oral Daily  . calcium acetate  667 mg Oral TID WC  . DAPTOmycin (CUBICIN)  IV  1,000 mg Intravenous Q48H  . feeding supplement (GLUCERNA SHAKE)  237 mL Oral TID BM  . feeding supplement (PRO-STAT SUGAR FREE 64)  30 mL Oral BID  . hydrocerin  1 application Topical Daily  . insulin aspart  0-5 Units Subcutaneous QHS  . insulin aspart  0-9 Units Subcutaneous TID WC  . insulin detemir  15 Units Subcutaneous QHS  . linezolid  600 mg Intravenous Q12H  . LORazepam  2 mg Intravenous Once  . rifampin (RIFADIN) IVPB  300 mg Intravenous 3 times per day  . sodium chloride  3 mL Intravenous Q12H   Continuous Infusions:   Principal Problem:   Endocarditis of  aortic valve Active Problems:   Acute encephalopathy   CKD (chronic kidney disease)   Leg ulcer   Diabetes mellitus   HTN (hypertension)   History of DVT (deep vein thrombosis)   Infectious aortic insufficiency   Bacteremia   DNR (do not resuscitate) discussion    Weakness generalized   Palliative care encounter    Time spent: 25 minutes    Hartley Barefootegalado, Helene Bernstein A MD [facility] Pager [phone]. If 7PM-7AM, please contact night-coverage at [email or web address], password [email or web address] 08/19/2014, 4:27 PM  LOS: 8 days

## 2014-08-20 ENCOUNTER — Inpatient Hospital Stay
Admission: AD | Admit: 2014-08-20 | Discharge: 2014-09-19 | Disposition: E | Payer: Self-pay | Source: Ambulatory Visit | Attending: Internal Medicine | Admitting: Internal Medicine

## 2014-08-20 DIAGNOSIS — I358 Other nonrheumatic aortic valve disorders: Secondary | ICD-10-CM

## 2014-08-20 DIAGNOSIS — R7881 Bacteremia: Secondary | ICD-10-CM

## 2014-08-20 DIAGNOSIS — A4902 Methicillin resistant Staphylococcus aureus infection, unspecified site: Secondary | ICD-10-CM

## 2014-08-20 DIAGNOSIS — G9340 Encephalopathy, unspecified: Secondary | ICD-10-CM

## 2014-08-20 LAB — CBC
HCT: 26.4 % — ABNORMAL LOW (ref 39.0–52.0)
HEMOGLOBIN: 8.3 g/dL — AB (ref 13.0–17.0)
MCH: 29.2 pg (ref 26.0–34.0)
MCHC: 31.4 g/dL (ref 30.0–36.0)
MCV: 93 fL (ref 78.0–100.0)
Platelets: 102 10*3/uL — ABNORMAL LOW (ref 150–400)
RBC: 2.84 MIL/uL — ABNORMAL LOW (ref 4.22–5.81)
RDW: 20.8 % — ABNORMAL HIGH (ref 11.5–15.5)
WBC: 18.5 10*3/uL — AB (ref 4.0–10.5)

## 2014-08-20 LAB — CLOSTRIDIUM DIFFICILE BY PCR: Toxigenic C. Difficile by PCR: NEGATIVE

## 2014-08-20 LAB — RENAL FUNCTION PANEL
Albumin: 2.1 g/dL — ABNORMAL LOW (ref 3.5–5.2)
Anion gap: 14 (ref 5–15)
BUN: 35 mg/dL — ABNORMAL HIGH (ref 6–23)
CO2: 26 mEq/L (ref 19–32)
Calcium: 7.9 mg/dL — ABNORMAL LOW (ref 8.4–10.5)
Chloride: 96 mEq/L (ref 96–112)
Creatinine, Ser: 4.77 mg/dL — ABNORMAL HIGH (ref 0.50–1.35)
GFR calc Af Amer: 16 mL/min — ABNORMAL LOW (ref >90)
GFR calc non Af Amer: 13 mL/min — ABNORMAL LOW (ref >90)
Glucose, Bld: 166 mg/dL — ABNORMAL HIGH (ref 70–99)
Phosphorus: 5.5 mg/dL — ABNORMAL HIGH (ref 2.3–4.6)
Potassium: 3.6 mEq/L — ABNORMAL LOW (ref 3.7–5.3)
Sodium: 136 mEq/L — ABNORMAL LOW (ref 137–147)

## 2014-08-20 LAB — GLUCOSE, CAPILLARY
Glucose-Capillary: 132 mg/dL — ABNORMAL HIGH (ref 70–99)
Glucose-Capillary: 185 mg/dL — ABNORMAL HIGH (ref 70–99)

## 2014-08-20 MED ORDER — OXYCODONE HCL 5 MG PO TABS: 5.0000 mg | ORAL_TABLET | Freq: Four times a day (QID) | ORAL | Status: AC | PRN

## 2014-08-20 MED ORDER — PRO-STAT SUGAR FREE PO LIQD: 30.0000 mL | Freq: Two times a day (BID) | ORAL | Status: AC

## 2014-08-20 MED ORDER — CALCIUM ACETATE 667 MG PO CAPS: 667.0000 mg | ORAL_CAPSULE | Freq: Three times a day (TID) | ORAL | Status: AC

## 2014-08-20 MED ORDER — SODIUM CHLORIDE 0.9 % IV SOLN: 300.0000 mg | Freq: Three times a day (TID) | INTRAVENOUS | Status: AC

## 2014-08-20 MED ORDER — ZOLPIDEM TARTRATE 10 MG PO TABS: 10.0000 mg | ORAL_TABLET | Freq: Every evening | ORAL | Status: AC | PRN

## 2014-08-20 MED ORDER — ALBUTEROL SULFATE (2.5 MG/3ML) 0.083% IN NEBU: 2.5000 mg | INHALATION_SOLUTION | Freq: Four times a day (QID) | RESPIRATORY_TRACT | Status: AC | PRN

## 2014-08-20 MED ORDER — SODIUM CHLORIDE 0.9 % IV SOLN: 1000.0000 mg | INTRAVENOUS | Status: AC

## 2014-08-20 MED ORDER — HYDROCERIN EX CREA: 1.0000 "application " | TOPICAL_CREAM | Freq: Every day | CUTANEOUS | Status: AC

## 2014-08-20 MED ORDER — LORAZEPAM 2 MG/ML IJ SOLN: 1.0000 mg | Freq: Four times a day (QID) | INTRAMUSCULAR | Status: AC | PRN

## 2014-08-20 MED ORDER — GLUCERNA SHAKE PO LIQD: 237.0000 mL | Freq: Three times a day (TID) | ORAL | Status: AC

## 2014-08-20 NOTE — Progress Notes (Signed)
Patient Name: Marry Guanhomas Keyworth Date of Encounter: 09/11/2014  Principal Problem:   Endocarditis of aortic valve Active Problems:   Acute encephalopathy   CKD (chronic kidney disease)   Leg ulcer   Diabetes mellitus   HTN (hypertension)   History of DVT (deep vein thrombosis)   Infectious aortic insufficiency   Bacteremia   DNR (do not resuscitate) discussion   Weakness generalized   Palliative care encounter    Patient Profile: 43 yo male w/ hx DM, L-BKA, HTN, MRSA sepsis 06/2014, hx DVT w/ IVC filter, chronic RLE venous stasis ulcers (no current infection per ortho), CKD on HD (initially felt temporary). [facility] for fever & troponin elevation, peak 1.30. 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 [facility]. TCTS has seen, not a surgical candidate. Palliative care has seen, but family wants aggressive care. Cards following. Pt has had some atrial fib, RVR but is mainly in SR.  SUBJECTIVE: A little more alert, moving a little more. Denies chest pain or SOB, no palpitations.  OBJECTIVE Filed Vitals:   08/19/14 1704 08/19/14 1711 08/19/14 2111 09/11/2014 0701  BP: 139/62 146/59 109/43 107/43  Pulse: 103 102 106 112  Temp:  98.1 F (36.7 C) 99.3 F (37.4 C) 99.1 F (37.3 C)  TempSrc:  Oral Oral Oral  Resp: 25 24 24 24   Height:      Weight:  399 lb 4.1 oz (181.1 kg)  402 lb 8 oz (182.573 kg)  SpO2:  100% 99% 92%    Intake/Output Summary (Last 24 hours) at 08/21/2014 0830 Last data filed at 08/19/14 1711  Gross per 24 hour  Intake      0 ml  Output   6000 ml  Net  -6000 ml   Filed Weights   08/19/14 1323 08/19/14 1711 09/15/2014 0701  Weight: 405 lb 13.9 oz (184.1 kg) 399 lb 4.1 oz (181.1 kg) 402 lb 8 oz (182.573 kg)    PHYSICAL EXAM General: Well developed, well nourished, male in no acute distress. Head: Normocephalic, atraumatic.  Neck: Supple without bruits, JVD slightly elevated. Lungs:  Resp regular and unlabored, rales bases. Heart:  RRR, S1, S2, no S3, S4, + murmur; ? Rub, but not clearly heard Abdomen: Soft, non-tender, non-distended, decreased BS.  Extremities: No clubbing, cyanosis, no RLE edema. S/p L BKA Neuro: Alert and oriented X 3. Moves all extremities spontaneously. Psych: Normal affect.  LABS: CBC: Recent Labs  08/19/14 0436 08/29/2014 0500  WBC 18.0* 18.5*  HGB 8.5* 8.3*  HCT 26.5* 26.4*  MCV 90.1 93.0  PLT 129* 102*   Basic Metabolic Panel: Recent Labs  08/19/14 0436 09/08/2014 0505  NA 138 136*  K 3.9 3.6*  CL 96 96  CO2 24 26  GLUCOSE 100* 166*  BUN 49* 35*  CREATININE 5.83* 4.77*  CALCIUM 8.1* 7.9*  PHOS 6.8* 5.5*   Liver Function Tests: Recent Labs  08/19/14 0436 09/03/2014 0505  ALBUMIN 2.1* 2.1*   Cardiac Enzymes: Recent Labs  08/19/14 0436  CKTOTAL 18    TELE:   SR, ST, episode possible atrial tach 5:30 pm 10/01  Current Medications:  . sodium chloride   Intravenous Once  . ARIPiprazole  5 mg Oral Daily  . calcium acetate  667 mg Oral TID WC  . DAPTOmycin (CUBICIN)  IV  1,000 mg Intravenous Q48H  . feeding supplement (GLUCERNA SHAKE)  237 mL Oral TID BM  . feeding supplement (PRO-STAT SUGAR FREE 64)  30 mL Oral BID  . hydrocerin  1 application Topical Daily  . insulin aspart  0-5 Units Subcutaneous QHS  . insulin aspart  0-9 Units Subcutaneous TID WC  . insulin detemir  15 Units Subcutaneous QHS  . linezolid  600 mg Intravenous Q12H  . LORazepam  2 mg Intravenous Once  . rifampin (RIFADIN) IVPB  300 mg Intravenous 3 times per day  . sodium chloride  3 mL Intravenous Q12H      ASSESSMENT AND PLAN: Principal Problem:  Endocarditis of aortic valve - ABX per IM/ID, TCTS has seen. Not currently a surgical candidate due to comorbidities. Dr. Ninetta Lights states IVC could be infected but does not recommend removal at this time. Repeat blood cx pending, negative so far. Ortho has seen, RLE with ulcers, not currently infected. Wound care recs made. Currently tolerating HD, w/  3 L off 10/01.   ?Atrial fib vs atrial tach - Short burst seen overnight 10/01, burst of possible atrial tach approx. 5:30 pm 10/01.  BP/HR variable, not currently high enough to tolerate BB. Med changes per MD. Not currently anticoagulated.   Potential for tx to [facility], IM has put in order for evaluation, wife is in favor.  Otherwise, per IM, ID, Renal, Ortho  Active Problems:  Acute encephalopathy  CKD (chronic kidney disease)  Leg ulcer  Diabetes mellitus  HTN (hypertension)  History of DVT (deep vein thrombosis)  Infectious aortic insufficiency  Bacteremia  DNR (do not resuscitate) discussion  Weakness generalized  Palliative care encounter   SignedTheodore Demark , PA-C 8:30 AM 01-Sep-2014

## 2014-08-20 NOTE — Discharge Summary (Signed)
Physician Discharge Summary  Joaquim Tolen OLM:[id number] DOB: 1971/11/13 DOA: 08/11/2014  PCP: [name], MD  Admit date: 08/11/2014 Discharge date: 09/05/2014  Time spent: 35 minutes  Recommendations for Outpatient Follow-up:  1. Please follow C diff results.  2. Might need repeat TEE after finish course of antibiotics.   Discharge Diagnoses:    Endocarditis of aortic valve   Acute encephalopathy   CKD (chronic kidney disease)   Leg ulcer   Diabetes mellitus   HTN (hypertension)   History of DVT (deep vein thrombosis)   Infectious aortic insufficiency   Bacteremia   DNR (do not resuscitate) discussion   Weakness generalized   Palliative care encounter   Discharge Condition: Stable.   Diet recommendation: Renal diet.   Filed Weights   08/19/14 1323 08/19/14 1711 08/24/2014 0701  Weight: 184.1 kg (405 lb 13.9 oz) 181.1 kg (399 lb 4.1 oz) 182.573 kg (402 lb 8 oz)    History of present illness:  Angel Mays is a 43 y.o. male  Who was transferred from [facility]- accepted by Dr. Olevia Bowens.  This is a 43 year old male with a very complicated past medical history including diabetes, hypertension, morbid obesity, chronic kidney disease, and left below-knee amputation. Patient has been in and out of hospitals since 2013. Not ambulatory at baseline. Wife at bedside is unable to provide exact dates of hospitalizations and/or treatments. Per the record that [facility] sent with patient- which DID NOT include a discharge summary. In August, he has a staph aureus bacteremia that they believe is associated with a right lower extremity infection. He was treated there with 4 weeks of IV vancomycin. A transthoracic echo done at that time was negative for endocarditis. A transesophageal echocardiogram was never done. Patient supposedly cleared his blood cultures.  Patient presents back to the hospital with positive blood cultures in September. He was also found to have acute on chronic renal failure with  a white blood cell count 26,000. Patient was also found to be anemic at 6.1. At [facility] they thought likely source was either a recurrent infection of his right lateral calf wound. Patient also has an IVC filter placed at some point for DVT. Patient was seen by multiple consultations at [facility]. 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 Patient was treated with daptomycin as well lizazolid. On 9/23. He had a TEE done by cardiology at [facility] that showed a large vegetation noted on the aortic valve with likely multi-leaflet vegetation. Was estimated at 1 cm in size with 2 or more seen at that time. It was extending into the 8 aortic root with systole back and to the LVOT in diastole. There was significant aortic valve regurgitation that was estimated at moderate to severe.   Hospital Course:  1 aortic valvular endocarditis/aortic valvular insufficiency// MRSA.  -Questionable etiology. Infection may be secondary to ulcer on the lower extremity which may have seeded.  -Patient has been seen by cardiology and cardiothoracic surgery and patient deemed not operative candidate.  -MRI head pending as per wife patient with memory issues. MRI was attempted and patient given about 4 mg of IV Ativan however unable to be done. Patient at this point in time is refusing MRI under sedation and as such cancelled MRI.  -Continue empiric IV Cubicin and IV Zyvox and rifampin for now. CVTS, ID, cardiology following and appreciate input and recommendations.  -Per cardiology and cardiothoracic surgery patient has a poor prognosis.  -  Family wants aggressive care.  -repeat Blood culture 9-29 no growth to date.   2 MRSA bacteremia  Repeat Blood cx with MRSA. Continue empiric IV Cubicin and and Rifampin. ID following and appreciate input and recommendations. Blood culture 9-29 no growth to date,.  continue daptomycin and rifampin  -if repeat  blood cultures are again positive, could add renally adjusted Teflaro to daptomycin.  -if repeat blood culture remains negative, he will still need a prolonged course of antibiotics until vegetation essentially resolves which could take months. It would be ideal to have a repeat TEE prior to considering stopping antibiotics but regardless should be on IV antibiotics at least 8 weeks from 9/29.   3 right lower extremity wound/infection  Continue current IV antibiotics. Patient has been seen by wound care, and recommendations made. X-rays of the right lower extremity negative for osteomyelitis. Orthopedics consulted for further evaluation.  Per ortho ulcers does not appears to be infected. No further aggressive treatment indicated to the wounds   4 Well controlled DM2  Continue home regimen of long-acting insulin. Sliding scale insulin.   5 chronic kidney disease stage IV/5  Per wife patient has been temporarily on hemodialysis while in the hospital at [facility] which was started 08/09/2014. Patient with poor urinary output. Patient has been seen by renal and patient underwent HD. Femoral HD catheter removed. Right IJ catheter has been placed for hemodialysis. Per renal.  Unable to place permanent access at this time due bacteremia.  Continue with dialysis. Dialysis tomorrow depending B-met.   6 anemia  The admission H&P patient is status post 5 units packed red blood cells at [place]. Follow H&H. Hemoglobin currently at 8.9. Transfusion threshhold Hgb <7.   7 history of DVT  Patient was on Elliquis, however this was discontinue secondary to anemia and chronic kidney disease. Patient is status post IVC filter. ?? Infected, remove IVC, defer to ID. No need to remove IVC filter per ID.   8 prophylaxis  PPI for GI prophylaxis. SCDs for DVT prophylaxis.   9 Leukocytosis: trending up. WBC fluctuates. Continue with IV antibiotics.   10-Diarrhea, abdominal pain; report 2 watery BM. No BM yesterday.  c diff pending. Please follow results. He refuse KUB, Ct scan.   prognosis  Patient with a very poor prognosis. Patient is presenting with aortic valvular endocarditis, MRSA bacteremia, chronic kidney disease stage IV/5 currently on HD started 08/09/14 at [facility], right lower extremity wound infection, type II diabetic, morbid obesity, chronic lower extremity venostasis and also noted to be anemic. Patient deemed not a surgical candidate. Family want aggressive measures.    Procedures: Chest x-ray 08/12/2014 TEE 08/11/2014 X-ray of the right lower extremity 08/13/2014 Right IJ trialysis catheter placement 09/29   Consultations: ID: Dr. Johnnye Sima 08/12/2014  Nephrology: Dr.Powell 08/12/2014  Cardiothoracic surgery Dr. Roxan Hockey 08/11/2014  Cardiology: Dr. Debara Pickett 08/11/2014. Dr Tamala Julian    Discharge Exam: Filed Vitals:   09/03/2014 0701  BP: 107/43  Pulse: 112  Temp: 99.1 F (37.3 C)  Resp: 24    General: Alert in no distress.  Cardiovascular: S 1 , S 2, RRR  Respiratory: CTA Abdomen; soft, mild tender.   Discharge Instructions You were cared for by a hospitalist during your hospital stay. If you have any questions about your discharge medications or the care you received while you were in the hospital after you are discharged, you can call the unit and asked to speak with the hospitalist on call if the hospitalist that took care of  you is not available. Once you are discharged, your primary care physician will handle any further medical issues. Please note that NO REFILLS for any discharge medications will be authorized once you are discharged, as it is imperative that you return to your primary care physician (or establish a relationship with a primary care physician if you do not have one) for your aftercare needs so that they can reassess your need for medications and monitor your lab values.  Discharge Instructions   Diet - low sodium heart healthy    Complete by:  As directed       Increase activity slowly    Complete by:  As directed           Current Discharge Medication List    START taking these medications   Details  calcium acetate (PHOSLO) 667 MG capsule Take 1 capsule (667 mg total) by mouth 3 (three) times daily with meals. Qty: 30 capsule, Refills: 0    DAPTOmycin 1,000 mg in sodium chloride 0.9 % 100 mL Inject 1,000 mg into the vein every other day. Qty: 1000 g, Refills: 0    feeding supplement, GLUCERNA SHAKE, (GLUCERNA SHAKE) LIQD Take 237 mLs by mouth 3 (three) times daily between meals. Qty: 30 Can, Refills: 0    hydrocerin (EUCERIN) CREA Apply 1 application topically daily. Qty: 3 g, Refills: 0    LORazepam (ATIVAN) 2 MG/ML injection Inject 0.5 mLs (1 mg total) into the vein every 6 (six) hours as needed for anxiety. Qty: 1 mL, Refills: 0    rifampin 300 mg in sodium chloride 0.9 % 100 mL Inject 300 mg into the vein every 8 (eight) hours. Qty: 3 g, Refills: 0    zolpidem (AMBIEN) 10 MG tablet Take 1 tablet (10 mg total) by mouth at bedtime as needed for sleep (Insomnia). Qty: 30 tablet, Refills: 0      CONTINUE these medications which have CHANGED   Details  albuterol (PROVENTIL) (2.5 MG/3ML) 0.083% nebulizer solution Take 3 mLs (2.5 mg total) by nebulization every 6 (six) hours as needed for wheezing or shortness of breath. Qty: 75 mL, Refills: 12    Amino Acids-Protein Hydrolys (FEEDING SUPPLEMENT, PRO-STAT SUGAR FREE 64,) LIQD Take 30 mLs by mouth 2 (two) times daily. Qty: 900 mL, Refills: 0    oxyCODONE (OXY IR/ROXICODONE) 5 MG immediate release tablet Take 1-2 tablets (5-10 mg total) by mouth every 6 (six) hours as needed for moderate pain or severe pain. Qty: 30 tablet, Refills: 0      CONTINUE these medications which have NOT CHANGED   Details  acetaminophen (TYLENOL) 325 MG tablet Take 650 mg by mouth every 4 (four) hours as needed for mild pain or fever.    ammonium lactate (AMLACTIN) 12 % cream Apply topically daily.  Apply to RLE    ARIPiprazole (ABILIFY) 5 MG tablet Take 5 mg by mouth every morning.     B Complex-C-Folic Acid (NEPHRO-VITE PO) Take 1 tablet by mouth at bedtime.     carvedilol (COREG) 25 MG tablet Take 25 mg by mouth 2 (two) times daily with a meal.    Cholecalciferol (VITAMIN D-3) 5000 UNITS TABS Take 1 tablet by mouth See admin instructions. Monday-Friday, none on Saturday and Sunday    collagenase (SANTYL) ointment Apply 1 application topically daily. Apply to R lateral calf and R drosal foot    famotidine (PEPCID) 20 MG tablet Take 20 mg by mouth 2 (two) times daily.    ferrous sulfate  325 (65 FE) MG tablet Take 325 mg by mouth every evening.     insulin detemir (LEVEMIR) 100 UNIT/ML injection Inject 15 Units into the skin at bedtime.    loratadine (CLARITIN) 10 MG tablet Take 10 mg by mouth every morning.    mirtazapine (REMERON) 30 MG tablet Take 30 mg by mouth at bedtime.    ondansetron (ZOFRAN) 8 MG tablet Take 8 mg by mouth every 6 (six) hours as needed for nausea or vomiting.    pentoxifylline (TRENTAL) 400 MG CR tablet Take 400 mg by mouth 3 (three) times daily.     Probiotic Product (RISA-BID PROBIOTIC PO) Take 1 tablet by mouth 2 (two) times daily.    senna-docusate (SENOKOT-S) 8.6-50 MG per tablet Take 1 tablet by mouth 2 (two) times daily.    sertraline (ZOLOFT) 100 MG tablet Take 150 mg by mouth at bedtime.       STOP taking these medications     allopurinol (ZYLOPRIM) 100 MG tablet      amLODipine (NORVASC) 10 MG tablet      apixaban (ELIQUIS) 5 MG TABS tablet      cloNIDine (CATAPRES) 0.2 MG tablet      DAPTOMYCIN IV      linezolid (ZYVOX) 2 MG/ML IVPB      OxyCODONE (OXYCONTIN) 10 mg T12A 12 hr tablet      promethazine (PHENERGAN) 25 MG/ML injection      sulfamethoxazole-trimethoprim (BACTRIM DS) 800-160 MG per tablet      Vancomycin HCl in Dextrose (VANCOCIN HCL IV)        Allergies  Allergen Reactions  . Omnipaque [Iohexol]        The results of significant diagnostics from this hospitalization (including imaging, microbiology, ancillary and laboratory) are listed below for reference.    Significant Diagnostic Studies: Dg Femur Right  08/13/2014   CLINICAL DATA:  Right lower extremity infection.  EXAM: RIGHT FEMUR - 2 VIEW  COMPARISON:  None.  FINDINGS: No fracture dislocation is noted. 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 No lytic destruction is seen to suggest osteomyelitis.  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 No acute abnormality seen in the right femur.   Electronically Signed   By: Sabino Dick M.D.   On: 08/13/2014 14:15   Dg Tibia/fibula Right  08/13/2014   CLINICAL DATA:  Lower extremity infection ; diabetes mellitus  EXAM: RIGHT TIBIA AND FIBULA - 2 VIEW  COMPARISON:  None.  FINDINGS: Frontal and lateral views were obtained. There is extensive vascular calcification with multiple phleboliths as well as arterial vascular calcification. There is no fracture or dislocation. There is no erosive change or bony destruction. There is osteoarthritic change in the right knee and ankle. There are spurs arising from the inferior calcaneus. There are soft tissue calcifications volar to the calcaneus.  IMPRESSION: Extensive soft tissue calcification. Suspect venous stasis as an major cause of some of this calcification. Other areas are consistent with arterial vascular calcification. This arterial calcification most likely is related to the patient's known diabetes mellitus. There is no demonstrable bony destruction. No fracture or dislocation. There is arthropathy in the right knee and ankle joint regions.   Electronically Signed   By: Lowella Grip M.D.   On: 08/13/2014 14:14   Ir Fluoro Guide Cv Line Right  08/17/2014   CLINICAL DATA:  Patient for temporary HD catheter to resume HD on 08/17/14; temp  TECHNIQUE: RIGHT IJ CATHETER PLACEMENT UNDER  ULTRASOUND AND  FLUOROSCOPIC GUIDANCE  The procedure, risks (including but not limited to bleeding, infection, organ damage, pneumothorax), benefits, and alternatives were explained to the patient. Questions regarding the procedure were encouraged and answered. The patient understands and consents to the procedure. Patency of the right IJ vein was confirmed with ultrasound with image documentation. An appropriate skin site was determined. Skin site was marked. Region was prepped using maximum barrier technique including cap and mask, sterile gown, sterile gloves, large sterile sheet, and Chlorhexidine as cutaneous antisepsis. The region was infiltrated locally with 1% lidocaine. Under real-time ultrasound guidance, the right IJ vein was accessed with a 19 gauge needle; the needle tip within the vein was confirmed with ultrasound image documentation. The needle exchanged over a guidewire for vascular dilator which allowed advancement of a 20 cm Trialysis catheter. This was positioned with the tip at the cavoatrial junction. Spot chest radiograph shows good positioning and no pneumothorax. Catheter was flushed and sutured externally with 0-Prolene sutures. Patient tolerated the procedure well, with no immediate complication.  IMPRESSION: 1. Technically successful right IJ Trialysis catheter placement.   Electronically Signed   By: Arne Cleveland M.D.   On: 08/17/2014 14:46   Ir US Guide Vasc Access Right  08/17/2014   CLINICAL DATA:  Patient for temporary HD catheter to resume HD on 08/17/14; temp  TECHNIQUE: RIGHT IJ CATHETER PLACEMENT UNDER ULTRASOUND AND FLUOROSCOPIC GUIDANCE  The procedure, risks (including but not limited to bleeding, infection, organ damage, pneumothorax), benefits, and alternatives were explained to the patient. Questions regarding the procedure were encouraged and answered. The patient understands and consents to the procedure. Patency of the right IJ vein was confirmed with ultrasound with image  documentation. An appropriate skin site was determined. Skin site was marked. Region was prepped using maximum barrier technique including cap and mask, sterile gown, sterile gloves, large sterile sheet, and Chlorhexidine as cutaneous antisepsis. The region was infiltrated locally with 1% lidocaine. Under real-time ultrasound guidance, the right IJ vein was accessed with a 19 gauge needle; the needle tip within the vein was confirmed with ultrasound image documentation. The needle exchanged over a guidewire for vascular dilator which allowed advancement of a 20 cm Trialysis catheter. This was positioned with the tip at the cavoatrial junction. Spot chest radiograph shows good positioning and no pneumothorax. Catheter was flushed and sutured externally with 0-Prolene sutures. Patient tolerated the procedure well, with no immediate complication.  IMPRESSION: 1. Technically successful right IJ Trialysis catheter placement.   Electronically Signed   By: Arne Cleveland M.D.   On: 08/17/2014 14:46   Dg Chest Port 1 View  08/12/2014   CLINICAL DATA:  Shortness of breath.  EXAM: PORTABLE CHEST - 1 VIEW  COMPARISON:  August 04, 2014.  FINDINGS: Stable cardiomegaly. No pneumothorax is noted. Right lung is clear. Left perihilar and basilar opacity is noted concerning for edema or possibly pneumonia. No significant pleural effusion is noted. Bony thorax is intact.  IMPRESSION: Left perihilar and basilar opacity is concerning for edema or possibly pneumonia. Followup radiographs are recommended until resolution.   Electronically Signed   By: Sabino Dick M.D.   On: 08/12/2014 08:36    Microbiology: Recent Results (from the past 240 hour(s))  CULTURE, BLOOD (ROUTINE X 2)     Status: None   Collection Time    08/11/14  8:43 PM      Result Value Ref Range Status   Specimen Description BLOOD LEFT HAND   Final  Special Requests BOTTLES DRAWN AEROBIC AND ANAEROBIC 10CC EACH   Final   Culture  Setup Time     Final    Value: 08/12/2014 03:41     Performed at [facility]   Culture     Final   Value: GRAM POSITIVE COCCI IN CLUSTERS     Note: Culture results may be compromised due to an excessive volume of blood received in culture bottles. Gram Stain Report Called to,Read Back By and Verified With: Katina Dung RN ON 09.25.15 AT 1938 BY HENDJ     Performed at [facility]   Report Status 08/15/2014 FINAL   Final  CULTURE, BLOOD (ROUTINE X 2)     Status: None   Collection Time    08/11/14  9:00 PM      Result Value Ref Range Status   Specimen Description BLOOD LEFT HAND   Final   Special Requests BOTTLES DRAWN AEROBIC ONLY 5CC   Final   Culture  Setup Time     Final   Value: 08/12/2014 03:38     Performed at [facility]   Culture     Final   Value: METHICILLIN RESISTANT STAPHYLOCOCCUS AUREUS     Note: RIFAMPIN AND GENTAMICIN SHOULD NOT BE USED AS SINGLE DRUGS FOR TREATMENT OF STAPH INFECTIONS. CRITICAL RESULT CALLED TO, READ BACK BY AND VERIFIED WITH: JANET [facility] 08/15/14 @ 10:10AM BY RUSCOE A. DAPTOMYCIN SENSITIVE <=1ug/mL     Note: Gram Stain Report Called to,Read Back By and Verified With: ELENA MOSQUEDA @ [id number] ON [id number] BY [facility]     Performed at [facility]   Report Status 08/19/2014 FINAL   Final   Organism ID, Bacteria METHICILLIN RESISTANT STAPHYLOCOCCUS AUREUS   Final  URINE CULTURE     Status: None   Collection Time    08/12/14  6:00 PM      Result Value Ref Range Status   Specimen Description URINE, RANDOM   Final   Special Requests NONE   Final   Culture  Setup Time     Final   Value: 08/12/2014 20:13     Performed at [place]     Final   Value: NO GROWTH     Performed at [facility]   Culture     Final   Value: NO GROWTH     Performed at [facility]   Report Status 08/13/2014 FINAL   Final  CULTURE, BLOOD (ROUTINE X 2)     Status: None   Collection Time    08/17/14  8:00 PM      Result Value Ref Range  Status   Specimen Description BLOOD LEFT HAND   Final   Special Requests BOTTLES DRAWN AEROBIC AND ANAEROBIC 5CC   Final   Culture  Setup Time     Final   Value: 08/18/2014 00:44     Performed at [facility]   Culture     Final   Value:        BLOOD CULTURE RECEIVED NO GROWTH TO DATE CULTURE WILL BE HELD FOR 5 DAYS BEFORE ISSUING A FINAL NEGATIVE REPORT     Performed at [facility]   Report Status PENDING   Incomplete     Labs: Basic Metabolic Panel:  Recent Labs Lab 08/16/14 0418 08/17/14 1051 08/18/14 0520 08/19/14 0436 09/07/2014 0505  NA 136* 136* 137 138 136*  K 4.6 4.6 3.9 3.9 3.6*  CL 96 94* 97 96 96  CO2 21 22 26 24 26   GLUCOSE 124* 144* 126* 100* 166*  BUN 49* 59* 41* 49* 35*  CREATININE 5.11* 6.20* 5.01* 5.83* 4.77*  CALCIUM 8.1* 8.1* 7.9* 8.1* 7.9*  PHOS 6.2* 8.1* 6.3* 6.8* 5.5*   Liver Function Tests:  Recent Labs Lab 08/16/14 0418 08/17/14 1051 08/18/14 0520 08/19/14 0436 09/01/2014 0505  ALBUMIN 2.2* 2.2* 2.1* 2.1* 2.1*   No results found for this basename: LIPASE, AMYLASE,  in the last 168 hours No results found for this basename: AMMONIA,  in the last 168 hours CBC:  Recent Labs Lab 08/13/14 1635  08/15/14 0939 08/16/14 0418 08/17/14 1051 08/18/14 0520 08/19/14 0436 09/15/2014 0500  WBC 21.4*  < > 18.8* 17.7* 15.4* 14.3* 18.0* 18.5*  NEUTROABS 17.9*  --  15.6* 14.6*  --   --   --   --   HGB 6.8*  < > 8.3* 8.3* 8.9* 8.3* 8.5* 8.3*  HCT 20.6*  < > 25.5* 25.7* 27.4* 26.1* 26.5* 26.4*  MCV 84.4  < > 87.0 87.4 90.4 91.9 90.1 93.0  PLT 251  < > 193 202 171 143* 129* 102*  < > = values in this interval not displayed. Cardiac Enzymes:  Recent Labs Lab 08/19/14 0436  CKTOTAL 18   BNP: BNP (last 3 results) No results found for this basename: PROBNP,  in the last 8760 hours CBG:  Recent Labs Lab 08/19/14 1133 08/19/14 1810 08/19/14 2110 08/22/2014 0827 08/19/2014 1236  GLUCAP 138* 116* 150* 132* 185*        Signed:  Westen Dinino A  [facility] 09/13/2014, 1:42 PM

## 2014-08-20 NOTE — Progress Notes (Signed)
[facility] Pt transported on monitor and oxygen to [id number] with 2 RN's and family. No complications

## 2014-08-20 NOTE — Progress Notes (Signed)
Medicare Important Message given?  YES (If response is "NO", the following Medicare IM given date fields will be blank) Date Medicare IM given:  09/09/2014 Medicare IM given by:  Lashaunda Schild 

## 2014-08-20 NOTE — Progress Notes (Signed)
Chaplain followed up to check in with family, at suggestion of nursing staff. Family declined chaplain visit. Chaplain will follow as needed.   Wille GlaserMcCray, Jessie Cowher O, Chaplain 09/03/2014 3:11 PM

## 2014-08-20 NOTE — Progress Notes (Signed)
[facility] ROUNDING NOTE   Subjective:   Interval History: no change  Objective:  Vital signs in last 24 hours:  Temp:  [98.1 F (36.7 C)-99.3 F (37.4 C)] 99.1 F (37.3 C) (10/02 0701) Pulse Rate:  [89-112] 112 (10/02 0701) Resp:  [16-25] 24 (10/02 0701) BP: (106-152)/(43-65) 107/43 mmHg (10/02 0701) SpO2:  [92 %-100 %] 92 % (10/02 0701) Weight:  [181.1 kg (399 lb 4.1 oz)-184.1 kg (405 lb 13.9 oz)] 182.573 kg (402 lb 8 oz) (10/02 0701)  Weight change: 0.393 kg (13.9 oz) Filed Weights   08/19/14 1323 08/19/14 1711 08/22/2014 0701  Weight: 184.1 kg (405 lb 13.9 oz) 181.1 kg (399 lb 4.1 oz) 182.573 kg (402 lb 8 oz)    Intake/Output: I/O last 3 completed shifts: In: 120 [P.O.:120] Out: 6000 [Other:6000]   Intake/Output this shift:     General: Morbidly obese male, alert, cooperative, NAD. Chronically ill-appearing.  HEENT: PERRL, EOMI. Moist mucus membranes.  Neck: Full range of motion without pain, supple, no lymphadenopathy or carotid bruits. RIJ HD catheter in place.  Lungs: Air entry equal bilaterally, rales at bases. No wheezes. On room air this AM.  Heart: Regular rate and rhythm, 3/6 combined systolic/diastolic murmur heard, loudest at left upper sternal border. No rubs or gallops.  Abdomen: Obese, non-tender, non-distended. BS present.  Extremities: RLE w/ severe swelling, erythema, and venous stasis changes. Foot w/ large overlying scab/chronic skin changes on dorsum of the foot. Leg w/ large overlying eschar on medial surface. Underlying skin erythematous, edematous. LLE w/ BKA.  Neurologic: Alert & oriented x3, cranial nerves grossly intact, strength grossly intact. Depressed mood, flat affect    Basic Metabolic Panel:  Recent Labs Lab 08/16/14 0418 08/17/14 1051 08/18/14 0520 08/19/14 0436 09/11/2014 0505  NA 136* 136* 137 138 136*  K 4.6 4.6 3.9 3.9 3.6*  CL 96 94* 97 96 96  CO2 21 22 26 24 26   GLUCOSE 124* 144* 126* 100* 166*  BUN 49*  59* 41* 49* 35*  CREATININE 5.11* 6.20* 5.01* 5.83* 4.77*  CALCIUM 8.1* 8.1* 7.9* 8.1* 7.9*  PHOS 6.2* 8.1* 6.3* 6.8* 5.5*    Liver Function Tests:  Recent Labs Lab 08/16/14 0418 08/17/14 1051 08/18/14 0520 08/19/14 0436 09/18/2014 0505  ALBUMIN 2.2* 2.2* 2.1* 2.1* 2.1*   No results found for this basename: LIPASE, AMYLASE,  in the last 168 hours No results found for this basename: AMMONIA,  in the last 168 hours  CBC:  Recent Labs Lab 08/13/14 1635  08/15/14 0939 08/16/14 0418 08/17/14 1051 08/18/14 0520 08/19/14 0436 09/08/2014 0500  WBC 21.4*  < > 18.8* 17.7* 15.4* 14.3* 18.0* 18.5*  NEUTROABS 17.9*  --  15.6* 14.6*  --   --   --   --   HGB 6.8*  < > 8.3* 8.3* 8.9* 8.3* 8.5* 8.3*  HCT 20.6*  < > 25.5* 25.7* 27.4* 26.1* 26.5* 26.4*  MCV 84.4  < > 87.0 87.4 90.4 91.9 90.1 93.0  PLT 251  < > 193 202 171 143* 129* 102*  < > = values in this interval not displayed.  Cardiac Enzymes:  Recent Labs Lab 08/19/14 0436  CKTOTAL 18    BNP: No components found with this basename: POCBNP,   CBG:  Recent Labs Lab 08/19/14 0737 08/19/14 1133 08/19/14 1810 08/19/14 2110 Aug 26, 2014 0827  GLUCAP 85 138* 116* 150* 132*    Microbiology: Results for orders placed during the hospital encounter of 08/11/14  CULTURE, BLOOD (ROUTINE X  2)     Status: None   Collection Time    08/11/14  8:43 PM      Result Value Ref Range Status   Specimen Description BLOOD LEFT HAND   Final   Special Requests BOTTLES DRAWN AEROBIC AND ANAEROBIC 10CC EACH   Final   Culture  Setup Time     Final   Value: 08/12/2014 03:41     Performed at [facility]   Culture     Final   Value: GRAM POSITIVE COCCI IN CLUSTERS     Note: Culture results may be compromised due to an excessive volume of blood received in culture bottles. Gram Stain Report Called to,Read Back By and Verified With: Jannette Spanner RN ON 09.25.15 AT 1938 BY HENDJ     Performed at [facility]   Report Status 08/15/2014  FINAL   Final  CULTURE, BLOOD (ROUTINE X 2)     Status: None   Collection Time    08/11/14  9:00 PM      Result Value Ref Range Status   Specimen Description BLOOD LEFT HAND   Final   Special Requests BOTTLES DRAWN AEROBIC ONLY 5CC   Final   Culture  Setup Time     Final   Value: 08/12/2014 03:38     Performed at [facility]   Culture     Final   Value: METHICILLIN RESISTANT STAPHYLOCOCCUS AUREUS     Note: RIFAMPIN AND GENTAMICIN SHOULD NOT BE USED AS SINGLE DRUGS FOR TREATMENT OF STAPH INFECTIONS. CRITICAL RESULT CALLED TO, READ BACK BY AND VERIFIED WITH: JANET [facility] 08/15/14 @ 10:10AM BY RUSCOE A. DAPTOMYCIN SENSITIVE <=1ug/mL     Note: Gram Stain Report Called to,Read Back By and Verified With: ELENA MOSQUEDA @ 1038 ON [id number] BY [facility]     Performed at [facility]   Report Status 08/19/2014 FINAL   Final   Organism ID, Bacteria METHICILLIN RESISTANT STAPHYLOCOCCUS AUREUS   Final  URINE CULTURE     Status: None   Collection Time    08/12/14  6:00 PM      Result Value Ref Range Status   Specimen Description URINE, RANDOM   Final   Special Requests NONE   Final   Culture  Setup Time     Final   Value: 08/12/2014 20:13     Performed at [facility] Count     Final   Value: NO GROWTH     Performed at [facility]   Culture     Final   Value: NO GROWTH     Performed at [facility]   Report Status 08/13/2014 FINAL   Final  CULTURE, BLOOD (ROUTINE X 2)     Status: None   Collection Time    08/17/14  8:00 PM      Result Value Ref Range Status   Specimen Description BLOOD LEFT HAND   Final   Special Requests BOTTLES DRAWN AEROBIC AND ANAEROBIC 5CC   Final   Culture  Setup Time     Final   Value: 08/18/2014 00:44     Performed at [facility]   Culture     Final   Value:        BLOOD CULTURE RECEIVED NO GROWTH TO DATE CULTURE WILL BE HELD FOR 5 DAYS BEFORE ISSUING A FINAL NEGATIVE REPORT     Performed at [facility]   Report Status  PENDING   Incomplete    Coagulation Studies: No results found for this basename: LABPROT, INR,  in the last 72 hours  Urinalysis: No results found for this basename: COLORURINE, APPERANCEUR, LABSPEC, PHURINE, GLUCOSEU, HGBUR, BILIRUBINUR, KETONESUR, PROTEINUR, UROBILINOGEN, NITRITE, LEUKOCYTESUR,  in the last 72 hours    Imaging: No results found.   Medications:     . sodium chloride   Intravenous Once  . ARIPiprazole  5 mg Oral Daily  . calcium acetate  667 mg Oral TID WC  . DAPTOmycin (CUBICIN)  IV  1,000 mg Intravenous Q48H  . feeding supplement (GLUCERNA SHAKE)  237 mL Oral TID BM  . feeding supplement (PRO-STAT SUGAR FREE 64)  30 mL Oral BID  . hydrocerin  1 application Topical Daily  . insulin aspart  0-5 Units Subcutaneous QHS  . insulin aspart  0-9 Units Subcutaneous TID WC  . insulin detemir  15 Units Subcutaneous QHS  . linezolid  600 mg Intravenous Q12H  . LORazepam  2 mg Intravenous Once  . rifampin (RIFADIN) IVPB  300 mg Intravenous 3 times per day  . sodium chloride  3 mL Intravenous Q12H   sodium chloride, sodium chloride, acetaminophen, acetaminophen, albuterol, calcium carbonate (dosed in mg elemental calcium), camphor-menthol, docusate sodium, feeding supplement (NEPRO CARB STEADY), feeding supplement (NEPRO CARB STEADY), heparin, hydrOXYzine, lidocaine (PF), lidocaine-prilocaine, LORazepam, ondansetron (ZOFRAN) IV, ondansetron, oxyCODONE, pentafluoroprop-tetrafluoroeth, sodium chloride, sorbitol, zolpidem  Assessment/ Plan:  43 y/o M w/ multiple co-morbidities, most significant for LE chronic venous stasis changes and RLE leg infection, RLE BKA, DM type II, CKD stage 5, transferred from [facility] w/ MRSA bacteremia, found to have large aortic vegetation on TEE. Also w/ worsening renal failure requiring HD via temporary left femoral HD catheter.  CKD stage 5-  Dialysis dependent nest dialysis session monday MRSA bacteremia- Had  previously been treated for MRSA bacteremia  Currently on Linezolid, Daptomycin, and Rifampin.  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 Not surgical candidate. Palliative Care following as well.   RLE venous stasis ulcers- Seen by orthodo not feel patient requires amputation at this time as they feel leg is not currently infected.  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  Normocytic Anemia- Baseline Hb 9. 8.3 this AM. Continue to monitor.  Hyperphosphatemia- Phos improved to 5.5 this AM. Continue Phoslo 667 mg po tid (started on 08/16/14).      LOS: 9 Jalesa Thien W @TODAY @12 :09 PM

## 2014-08-20 NOTE — Progress Notes (Signed)
Agree. Are we all in agreement that anticoagulation not possible in this patient?

## 2014-08-20 NOTE — Progress Notes (Signed)
[facility]  Date of Admission:  08/11/2014  Antibiotics: daptomycin linezolid rifampin  Subjective: Being discharged to [facility] today  Objective: Temp:  [98.1 F (36.7 C)-99.3 F (37.4 C)] 99.1 F (37.3 C) (10/02 0701) Pulse Rate:  [89-112] 112 (10/02 0701) Resp:  [16-25] 24 (10/02 0701) BP: (106-152)/(43-65) 107/43 mmHg (10/02 0701) SpO2:  [92 %-100 %] 92 % (10/02 0701) Weight:  [399 lb 4.1 oz (181.1 kg)-405 lb 13.9 oz (184.1 kg)] 402 lb 8 oz (182.573 kg) (10/02 0701)  General: nad Skin: no rashes  Lab Results Lab Results  Component Value Date   WBC 18.5* 09/13/2014   HGB 8.3* 09/16/2014   HCT 26.4* 08/27/2014   MCV 93.0 08/30/2014   PLT 102* 08/25/2014    Lab Results  Component Value Date   CREATININE 4.77* 09/16/2014   BUN 35* 09/18/2014   NA 136* 09/10/2014   K 3.6* 08/31/2014   CL 96 08/30/2014   CO2 26 09/08/2014    Lab Results  Component Value Date   ALT 52 08/12/2014   AST 78* 08/12/2014   ALKPHOS 126* 08/12/2014   BILITOT 1.4* 08/12/2014      Microbiology: Recent Results (from the past 240 hour(s))  CULTURE, BLOOD (ROUTINE X 2)     Status: None   Collection Time    08/11/14  8:43 PM      Result Value Ref Range Status   Specimen Description BLOOD LEFT HAND   Final   Special Requests BOTTLES DRAWN AEROBIC AND ANAEROBIC 10CC EACH   Final   Culture  Setup Time     Final   Value: 08/12/2014 03:41     Performed at [facility]   Culture     Final   Value: GRAM POSITIVE COCCI IN CLUSTERS     Note: Culture results may be compromised due to an excessive volume of blood received in culture bottles. Gram Stain Report Called to,Read Back By and Verified With: Jannette Spanner RN ON 09.25.15 AT 1938 BY HENDJ     Performed at [facility]   Report Status 08/15/2014 FINAL   Final  CULTURE, BLOOD (ROUTINE X 2)     Status: None   Collection Time    08/11/14  9:00 PM      Result Value Ref Range Status   Specimen Description BLOOD LEFT  HAND   Final   Special Requests BOTTLES DRAWN AEROBIC ONLY 5CC   Final   Culture  Setup Time     Final   Value: 08/12/2014 03:38     Performed at [facility]   Culture     Final   Value: METHICILLIN RESISTANT STAPHYLOCOCCUS AUREUS     Note: RIFAMPIN AND GENTAMICIN SHOULD NOT BE USED AS SINGLE DRUGS FOR TREATMENT OF STAPH INFECTIONS. CRITICAL RESULT CALLED TO, READ BACK BY AND VERIFIED WITH: JANET [facility] 08/15/14 @ 10:10AM BY RUSCOE A. DAPTOMYCIN SENSITIVE <=1ug/mL     Note: Gram Stain Report Called to,Read Back By and Verified With: ELENA MOSQUEDA @ 1038 ON [id number] BY [facility]     Performed at [facility]   Report Status 08/19/2014 FINAL   Final   Organism ID, Bacteria METHICILLIN RESISTANT STAPHYLOCOCCUS AUREUS   Final  URINE CULTURE     Status: None   Collection Time    08/12/14  6:00 PM      Result Value Ref Range Status   Specimen Description URINE, RANDOM   Final   Special  Requests NONE   Final   Culture  Setup Time     Final   Value: 08/12/2014 20:13     Performed at [facility]   [place] Count     Final   Value: NO GROWTH     [facility]formed at [facility]   Culture     Final   Value: NO GROWTH     Performed at [facility]   Report Status 08/13/2014 FINAL   Final  CULTURE, BLOOD (ROUTINE X 2)     Status: None   Collection Time    08/17/14  8:00 PM      Result Value Ref Range Status   Specimen Description BLOOD LEFT HAND   Final   Special Requests BOTTLES DRAWN AEROBIC AND ANAEROBIC 5CC   Final   Culture  Setup Time     Final   Value: 08/18/2014 00:44     Performed at [facility]   Culture     Final   Value:        BLOOD CULTURE RECEIVED NO GROWTH TO DATE CULTURE WILL BE HELD FOR 5 DAYS BEFORE ISSUING A FINAL NEGATIVE REPORT     Performed at [facility]   Report Status PENDING   Incomplete    Studies/Results: No results found.  Assessment/Plan: 1)  MRSA infective endocarditis with persistent bacteremia - his  repeat blood culture is negative from 9/29.  He has been on daptomycin and linezolid and both are sensitive.  Also rifampin.  His platelets though have started to decrease and I am concerned of linezolid as the cause.   -Stop linezolid -continue daptomycin and rifampin -if repeat blood cultures are again positive, could add renally adjusted Teflaro to daptomycin. -if repeat blood culture remains negative, he will still need a prolonged course of antibiotics until vegetation essentially resolves which could take months.  It would be ideal to have a repeat TEE prior to considering stopping antibiotics but regardless should be on IV antibiotics at least 8 weeks from 9/29.    Thanks for consult, call with questions.   Staci RighterOMER, Deztinee Lohmeyer, MD [facility] [email or web address] [phone] pager   [phone] cell 08/19/2014, 11:27 AM

## 2014-08-21 ENCOUNTER — Other Ambulatory Visit (HOSPITAL_COMMUNITY): Payer: Medicare Other

## 2014-08-21 DIAGNOSIS — R609 Edema, unspecified: Secondary | ICD-10-CM

## 2014-08-21 LAB — COMPREHENSIVE METABOLIC PANEL
ALT: 81 U/L — AB (ref 0–53)
AST: 46 U/L — AB (ref 0–37)
Albumin: 2.1 g/dL — ABNORMAL LOW (ref 3.5–5.2)
Alkaline Phosphatase: 119 U/L — ABNORMAL HIGH (ref 39–117)
Anion gap: 16 — ABNORMAL HIGH (ref 5–15)
BILIRUBIN TOTAL: 1.9 mg/dL — AB (ref 0.3–1.2)
BUN: 42 mg/dL — ABNORMAL HIGH (ref 6–23)
CHLORIDE: 97 meq/L (ref 96–112)
CO2: 25 meq/L (ref 19–32)
Calcium: 8.1 mg/dL — ABNORMAL LOW (ref 8.4–10.5)
Creatinine, Ser: 5.66 mg/dL — ABNORMAL HIGH (ref 0.50–1.35)
GFR calc Af Amer: 13 mL/min — ABNORMAL LOW (ref >90)
GFR, EST NON AFRICAN AMERICAN: 11 mL/min — AB (ref >90)
GLUCOSE: 120 mg/dL — AB (ref 70–99)
Potassium: 3.9 mEq/L (ref 3.7–5.3)
SODIUM: 138 meq/L (ref 137–147)
Total Protein: 6.7 g/dL (ref 6.0–8.3)

## 2014-08-21 LAB — CBC WITH DIFFERENTIAL/PLATELET
Basophils Absolute: 0.1 10*3/uL (ref 0.0–0.1)
Basophils Relative: 0 % (ref 0–1)
Eosinophils Absolute: 0.3 10*3/uL (ref 0.0–0.7)
Eosinophils Relative: 2 % (ref 0–5)
HEMATOCRIT: 25.4 % — AB (ref 39.0–52.0)
Hemoglobin: 8.1 g/dL — ABNORMAL LOW (ref 13.0–17.0)
LYMPHS ABS: 1.3 10*3/uL (ref 0.7–4.0)
LYMPHS PCT: 9 % — AB (ref 12–46)
MCH: 29.5 pg (ref 26.0–34.0)
MCHC: 31.9 g/dL (ref 30.0–36.0)
MCV: 92.4 fL (ref 78.0–100.0)
MONO ABS: 1.5 10*3/uL — AB (ref 0.1–1.0)
Monocytes Relative: 10 % (ref 3–12)
NEUTROS ABS: 12 10*3/uL — AB (ref 1.7–7.7)
Neutrophils Relative %: 79 % — ABNORMAL HIGH (ref 43–77)
Platelets: 88 10*3/uL — ABNORMAL LOW (ref 150–400)
RBC: 2.75 MIL/uL — ABNORMAL LOW (ref 4.22–5.81)
RDW: 20.7 % — AB (ref 11.5–15.5)
WBC: 15.2 10*3/uL — AB (ref 4.0–10.5)

## 2014-08-21 LAB — VITAMIN B12: Vitamin B-12: 1244 pg/mL — ABNORMAL HIGH (ref 211–911)

## 2014-08-21 LAB — PROCALCITONIN: Procalcitonin: 4.19 ng/mL

## 2014-08-21 LAB — MAGNESIUM: Magnesium: 2.1 mg/dL (ref 1.5–2.5)

## 2014-08-21 LAB — PROTIME-INR
INR: 1.46 (ref 0.00–1.49)
Prothrombin Time: 17.7 seconds — ABNORMAL HIGH (ref 11.6–15.2)

## 2014-08-21 LAB — CK: CK TOTAL: 18 U/L (ref 7–232)

## 2014-08-21 LAB — PREALBUMIN: Prealbumin: 6.4 mg/dL — ABNORMAL LOW (ref 17.0–34.0)

## 2014-08-21 LAB — TSH: TSH: 2.98 u[IU]/mL (ref 0.350–4.500)

## 2014-08-21 LAB — PHOSPHORUS: Phosphorus: 6.9 mg/dL — ABNORMAL HIGH (ref 2.3–4.6)

## 2014-08-21 NOTE — Progress Notes (Signed)
VASCULAR LAB PRELIMINARY  PRELIMINARY  PRELIMINARY  PRELIMINARY  Bilateral lower extremity venous Dopplers completed.    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  There is chronic DVT noted in the left common femoral and femoral veins.    Kailly Richoux, RVT 08/21/2014, 6:53 PM

## 2014-08-22 LAB — CBC WITH DIFFERENTIAL/PLATELET
Basophils Absolute: 0.1 10*3/uL (ref 0.0–0.1)
Basophils Relative: 0 % (ref 0–1)
EOS ABS: 0.2 10*3/uL (ref 0.0–0.7)
EOS PCT: 1 % (ref 0–5)
HCT: 26.4 % — ABNORMAL LOW (ref 39.0–52.0)
HEMOGLOBIN: 8.4 g/dL — AB (ref 13.0–17.0)
LYMPHS ABS: 0.9 10*3/uL (ref 0.7–4.0)
LYMPHS PCT: 6 % — AB (ref 12–46)
MCH: 29.2 pg (ref 26.0–34.0)
MCHC: 31.8 g/dL (ref 30.0–36.0)
MCV: 91.7 fL (ref 78.0–100.0)
MONOS PCT: 8 % (ref 3–12)
Monocytes Absolute: 1.3 10*3/uL — ABNORMAL HIGH (ref 0.1–1.0)
NEUTROS PCT: 85 % — AB (ref 43–77)
Neutro Abs: 13.5 10*3/uL — ABNORMAL HIGH (ref 1.7–7.7)
Platelets: 153 10*3/uL (ref 150–400)
RBC: 2.88 MIL/uL — AB (ref 4.22–5.81)
RDW: 19.9 % — ABNORMAL HIGH (ref 11.5–15.5)
WBC: 15.9 10*3/uL — ABNORMAL HIGH (ref 4.0–10.5)

## 2014-08-22 LAB — BASIC METABOLIC PANEL
Anion gap: 15 (ref 5–15)
BUN: 48 mg/dL — AB (ref 6–23)
CO2: 25 meq/L (ref 19–32)
Calcium: 8.4 mg/dL (ref 8.4–10.5)
Chloride: 96 mEq/L (ref 96–112)
Creatinine, Ser: 6.3 mg/dL — ABNORMAL HIGH (ref 0.50–1.35)
GFR calc Af Amer: 11 mL/min — ABNORMAL LOW (ref >90)
GFR, EST NON AFRICAN AMERICAN: 10 mL/min — AB (ref >90)
GLUCOSE: 119 mg/dL — AB (ref 70–99)
POTASSIUM: 5 meq/L (ref 3.7–5.3)
Sodium: 136 mEq/L — ABNORMAL LOW (ref 137–147)

## 2014-08-22 LAB — PHOSPHORUS: Phosphorus: 7.9 mg/dL — ABNORMAL HIGH (ref 2.3–4.6)

## 2014-08-22 LAB — MAGNESIUM: Magnesium: 2.2 mg/dL (ref 1.5–2.5)

## 2014-08-23 LAB — CBC
HEMATOCRIT: 27.9 % — AB (ref 39.0–52.0)
Hemoglobin: 8.7 g/dL — ABNORMAL LOW (ref 13.0–17.0)
MCH: 29.9 pg (ref 26.0–34.0)
MCHC: 31.2 g/dL (ref 30.0–36.0)
MCV: 95.9 fL (ref 78.0–100.0)
Platelets: 116 10*3/uL — ABNORMAL LOW (ref 150–400)
RBC: 2.91 MIL/uL — ABNORMAL LOW (ref 4.22–5.81)
RDW: 19.7 % — ABNORMAL HIGH (ref 11.5–15.5)
WBC: 13.7 10*3/uL — ABNORMAL HIGH (ref 4.0–10.5)

## 2014-08-23 LAB — RENAL FUNCTION PANEL
ANION GAP: 13 (ref 5–15)
Albumin: 2 g/dL — ABNORMAL LOW (ref 3.5–5.2)
BUN: 54 mg/dL — AB (ref 6–23)
CHLORIDE: 98 meq/L (ref 96–112)
CO2: 25 mEq/L (ref 19–32)
Calcium: 8.3 mg/dL — ABNORMAL LOW (ref 8.4–10.5)
Creatinine, Ser: 7.09 mg/dL — ABNORMAL HIGH (ref 0.50–1.35)
GFR calc Af Amer: 10 mL/min — ABNORMAL LOW (ref >90)
GFR calc non Af Amer: 8 mL/min — ABNORMAL LOW (ref >90)
Glucose, Bld: 175 mg/dL — ABNORMAL HIGH (ref 70–99)
PHOSPHORUS: 8.6 mg/dL — AB (ref 2.3–4.6)
Potassium: 4.5 mEq/L (ref 3.7–5.3)
Sodium: 136 mEq/L — ABNORMAL LOW (ref 137–147)

## 2014-08-23 LAB — FOLATE RBC: RBC Folate: >1000 ng/mL — ABNORMAL HIGH (ref >280)

## 2014-08-23 NOTE — Progress Notes (Signed)
Requested to review risk/benefit of anticoagulation since new DVT/subacute DVT and chronic DVT seen on LE Duplex 10/03.  Spoke with Dr. Katrinka BlazingSmith, reviewed the chart.  Mr Angel Mays is at increased risk for anticoagulation. Xarelto was [facility] on [facility] because of his serious medical condition, anemia and infection.   He has increased bleeding risk from wounds and thrombocytopenia. He also has ongoing anemia, and there has been concern for ongoing blood loss, but none has been found. Iron is normal.   He has an IVC filter in place, reducing his risk for PE.   We will leave it to his attending physician to determine if the risk outweighs the benefit, but feel he is at significantly increased risk for anticoagulation.   Theodore DemarkRhonda Ules Marsala, PA-C 08/23/2014 1:49 PM Beeper [phone]

## 2014-08-24 DIAGNOSIS — I358 Other nonrheumatic aortic valve disorders: Secondary | ICD-10-CM

## 2014-08-24 DIAGNOSIS — R7881 Bacteremia: Secondary | ICD-10-CM

## 2014-08-24 LAB — CULTURE, BLOOD (ROUTINE X 2): Culture: NO GROWTH

## 2014-08-24 LAB — CK: Total CK: 18 U/L (ref 7–232)

## 2014-08-24 NOTE — Progress Notes (Signed)
    SUBJECTIVE:  Denies pain.  Very tired   PHYSICAL EXAM VS 112/48, 96, 99%, 97.6 General:  Chronically ill appearing Lungs:  Decreased breath sounds Heart:  RRR Abdomen:  Positive bowel sounds, no rebound no guarding Extremities:  Mild diffuse edema   ASSESSMENT AND PLAN:  Aortic Valve Endocarditis:  MRSA bacteremia.  Not a surgical candidate.     DVT:  Anticoagulation per primary team.  Multiple risks for both recurrent thrombotic events but also multiple riks for bleeding.  Risk benefit discussion should be held between the primary team, the team that will follow him as an outpatient the family and the patient so that all involved can clearly discuss risk benefits.  Again, thromboembolic risk without anticoagulation is high.    We will see as needed.    Fayrene FearingJames Vladislav Axelson 08/24/2014 8:11 AM

## 2014-08-24 NOTE — Progress Notes (Signed)
Agree with this note as we discussed.

## 2014-08-25 LAB — RENAL FUNCTION PANEL
ALBUMIN: 2.1 g/dL — AB (ref 3.5–5.2)
ANION GAP: 12 (ref 5–15)
BUN: 46 mg/dL — ABNORMAL HIGH (ref 6–23)
CHLORIDE: 96 meq/L (ref 96–112)
CO2: 28 mEq/L (ref 19–32)
Calcium: 8.4 mg/dL (ref 8.4–10.5)
Creatinine, Ser: 6.47 mg/dL — ABNORMAL HIGH (ref 0.50–1.35)
GFR calc non Af Amer: 9 mL/min — ABNORMAL LOW (ref >90)
GFR, EST AFRICAN AMERICAN: 11 mL/min — AB (ref >90)
GLUCOSE: 148 mg/dL — AB (ref 70–99)
PHOSPHORUS: 7.3 mg/dL — AB (ref 2.3–4.6)
POTASSIUM: 4.3 meq/L (ref 3.7–5.3)
SODIUM: 136 meq/L — AB (ref 137–147)

## 2014-08-25 LAB — CBC
HCT: 27.5 % — ABNORMAL LOW (ref 39.0–52.0)
Hemoglobin: 8.3 g/dL — ABNORMAL LOW (ref 13.0–17.0)
MCH: 29.5 pg (ref 26.0–34.0)
MCHC: 30.2 g/dL (ref 30.0–36.0)
MCV: 97.9 fL (ref 78.0–100.0)
PLATELETS: 81 10*3/uL — AB (ref 150–400)
RBC: 2.81 MIL/uL — AB (ref 4.22–5.81)
RDW: 18.5 % — AB (ref 11.5–15.5)
WBC: 13.1 10*3/uL — AB (ref 4.0–10.5)

## 2014-08-25 LAB — HEPATITIS B SURFACE ANTIGEN: Hepatitis B Surface Ag: NEGATIVE

## 2014-09-06 LAB — CULTURE, BLOOD (SINGLE)

## 2014-09-19 DEATH — deceased

## 2015-02-07 IMAGING — CR DG CHEST 1V PORT
2 series · 2 of 2 positions shown · non-contrast
Comparison: DG CHEST 1V PORT dated 01/22/2014

CLINICAL DATA: Central line placement.

EXAM:
PORTABLE CHEST - 1 VIEW

[AP (1 of 2)]
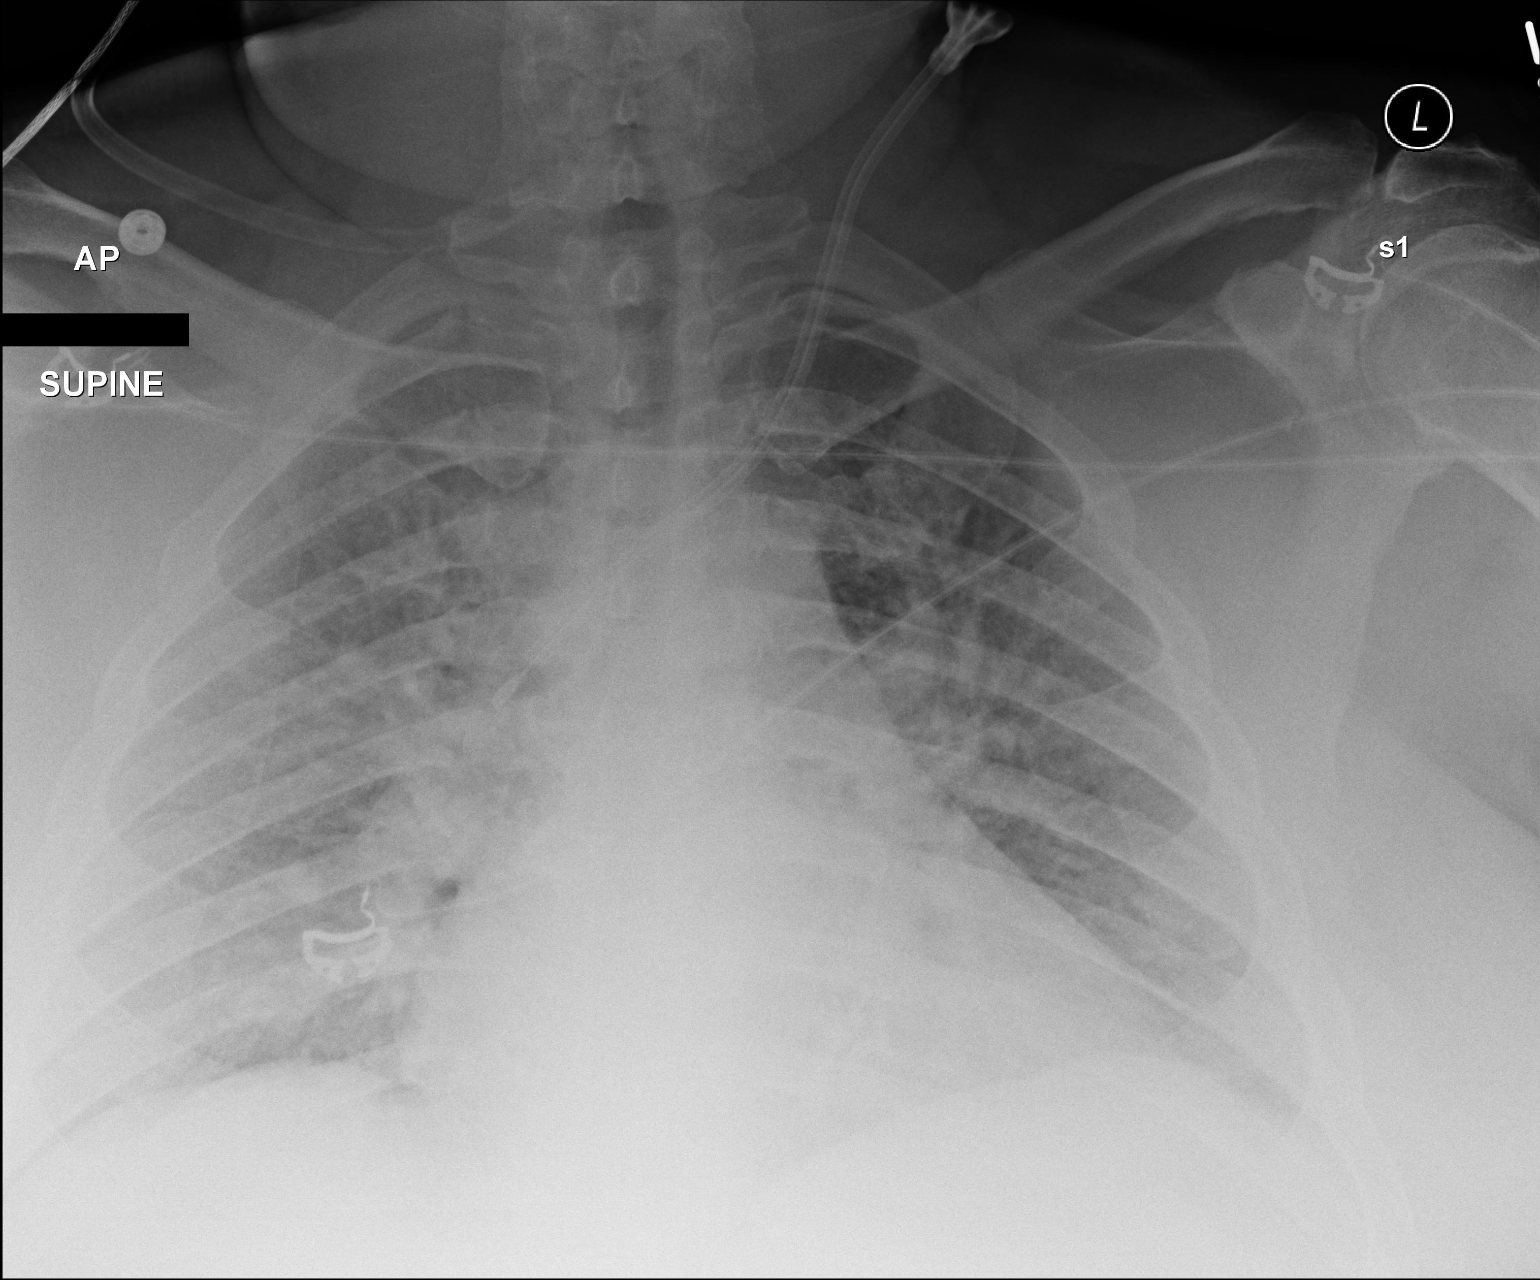

[AP (2 of 2)]
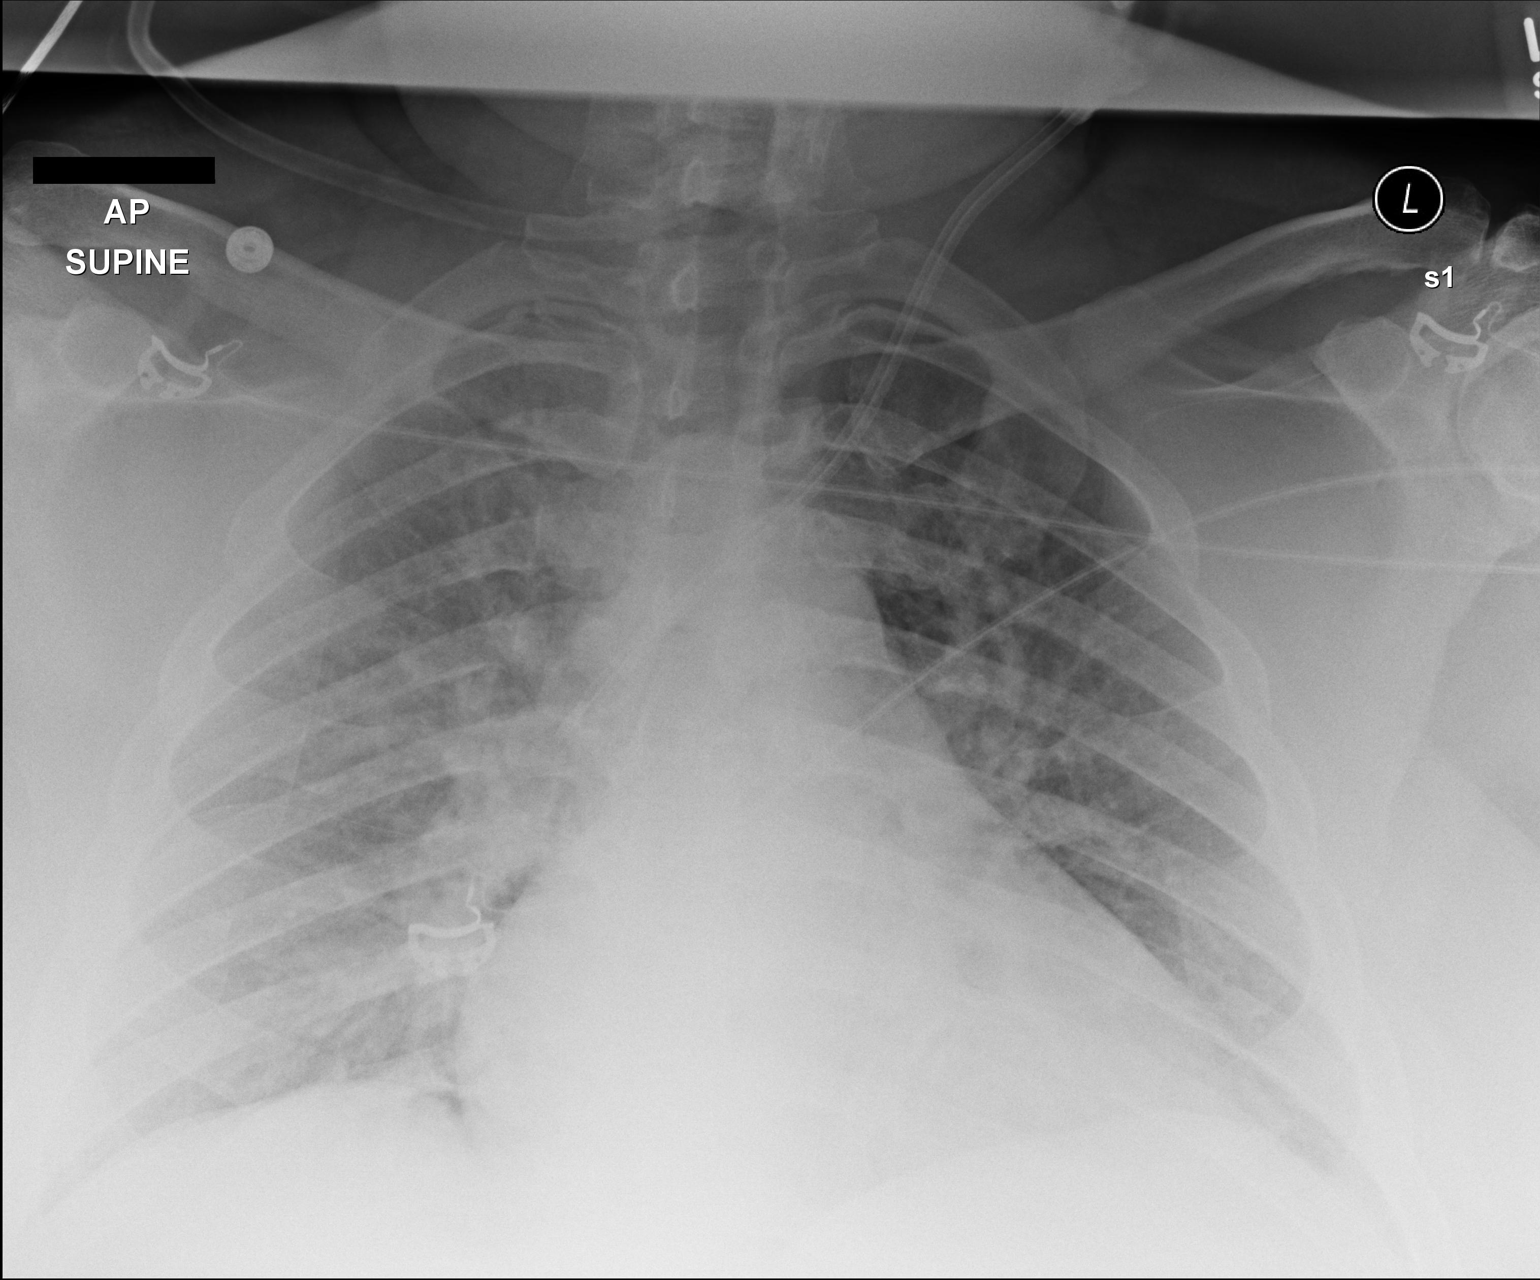

[2 of 2 positions shown; findings below may reference images not displayed]

FINDINGS: Left IJ central line tip projects over the SVC.  No pneumothorax.

Mild cardiomegaly noted with pulmonary venous hypertension and
interstitial accentuation favoring interstitial edema.
IMPRESSION: 1. Central line tip:  SVC.  No pneumothorax.
2. Cardiomegaly with interstitial accentuation favoring interstitial
edema.

## 2015-02-07 IMAGING — CR DG CHEST 1V PORT
1 series · 1 of 1 positions shown · non-contrast
Comparison: Insert scratch pad earlier film of the same day

CLINICAL DATA: Line placement

EXAM:
PORTABLE CHEST - 1 VIEW

[AP]
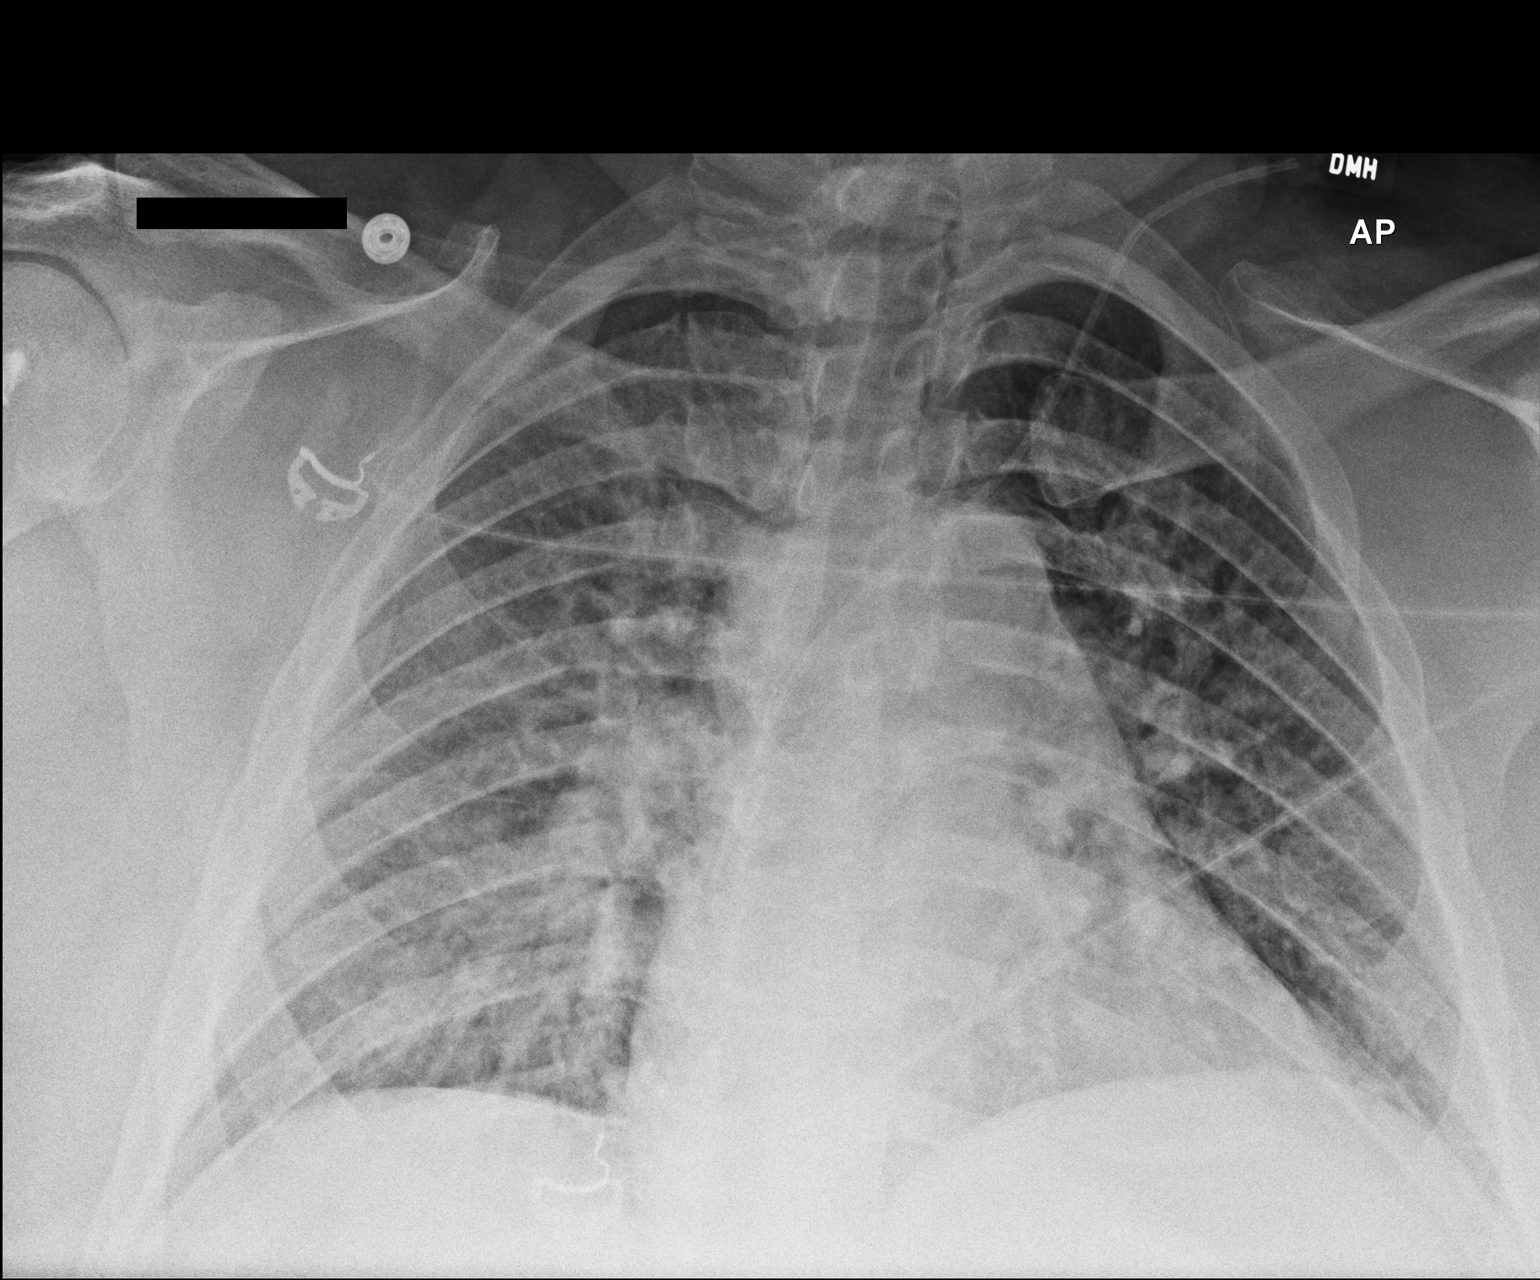

[1 of 1 positions shown; findings below may reference images not displayed]

FINDINGS: Left IJ central line extends to the cavoatrial junction. No
pneumothorax evident. Prominent perihilar interstitial markings. No
effusion. Heart size upper limits normal. .
IMPRESSION: 1. Central line to cavoatrial junction without pneumothorax.

## 2015-02-12 IMAGING — CR DG HAND 2V*R*
2 series · 2 of 2 positions shown · non-contrast
Comparison: none

[PA]
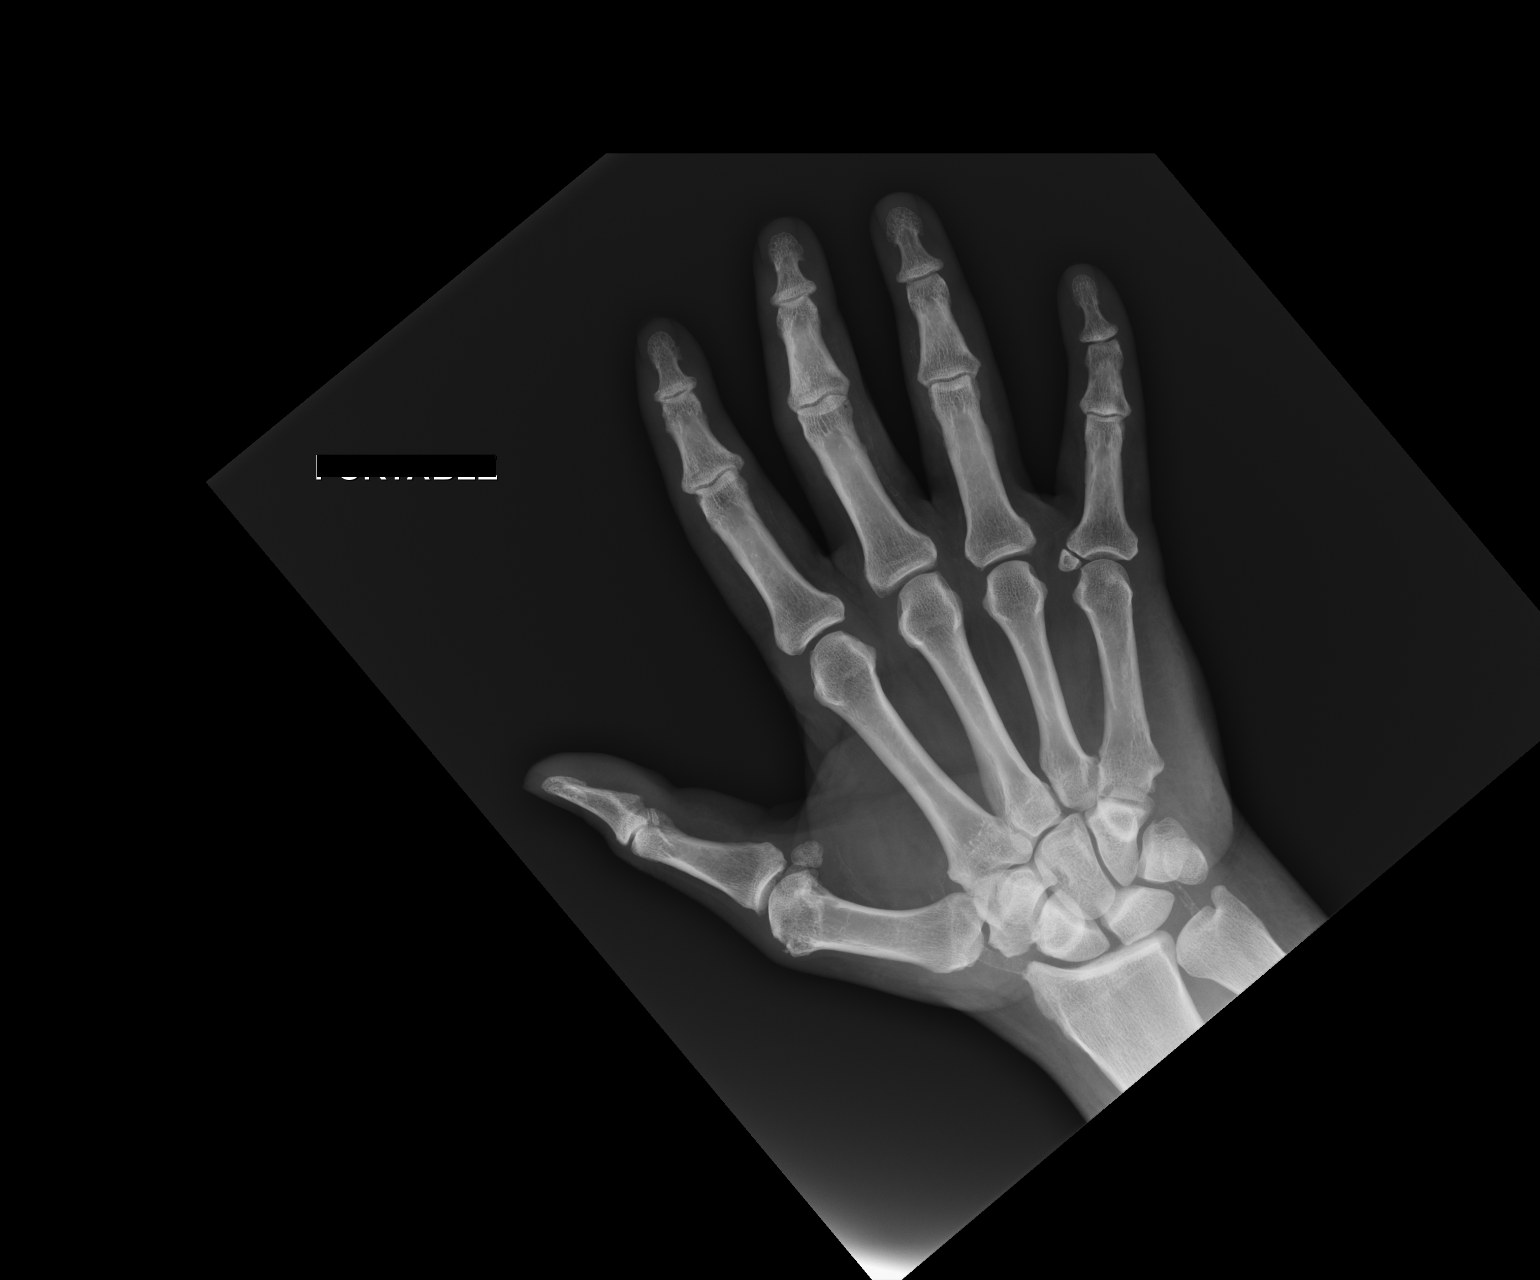

[lateral]
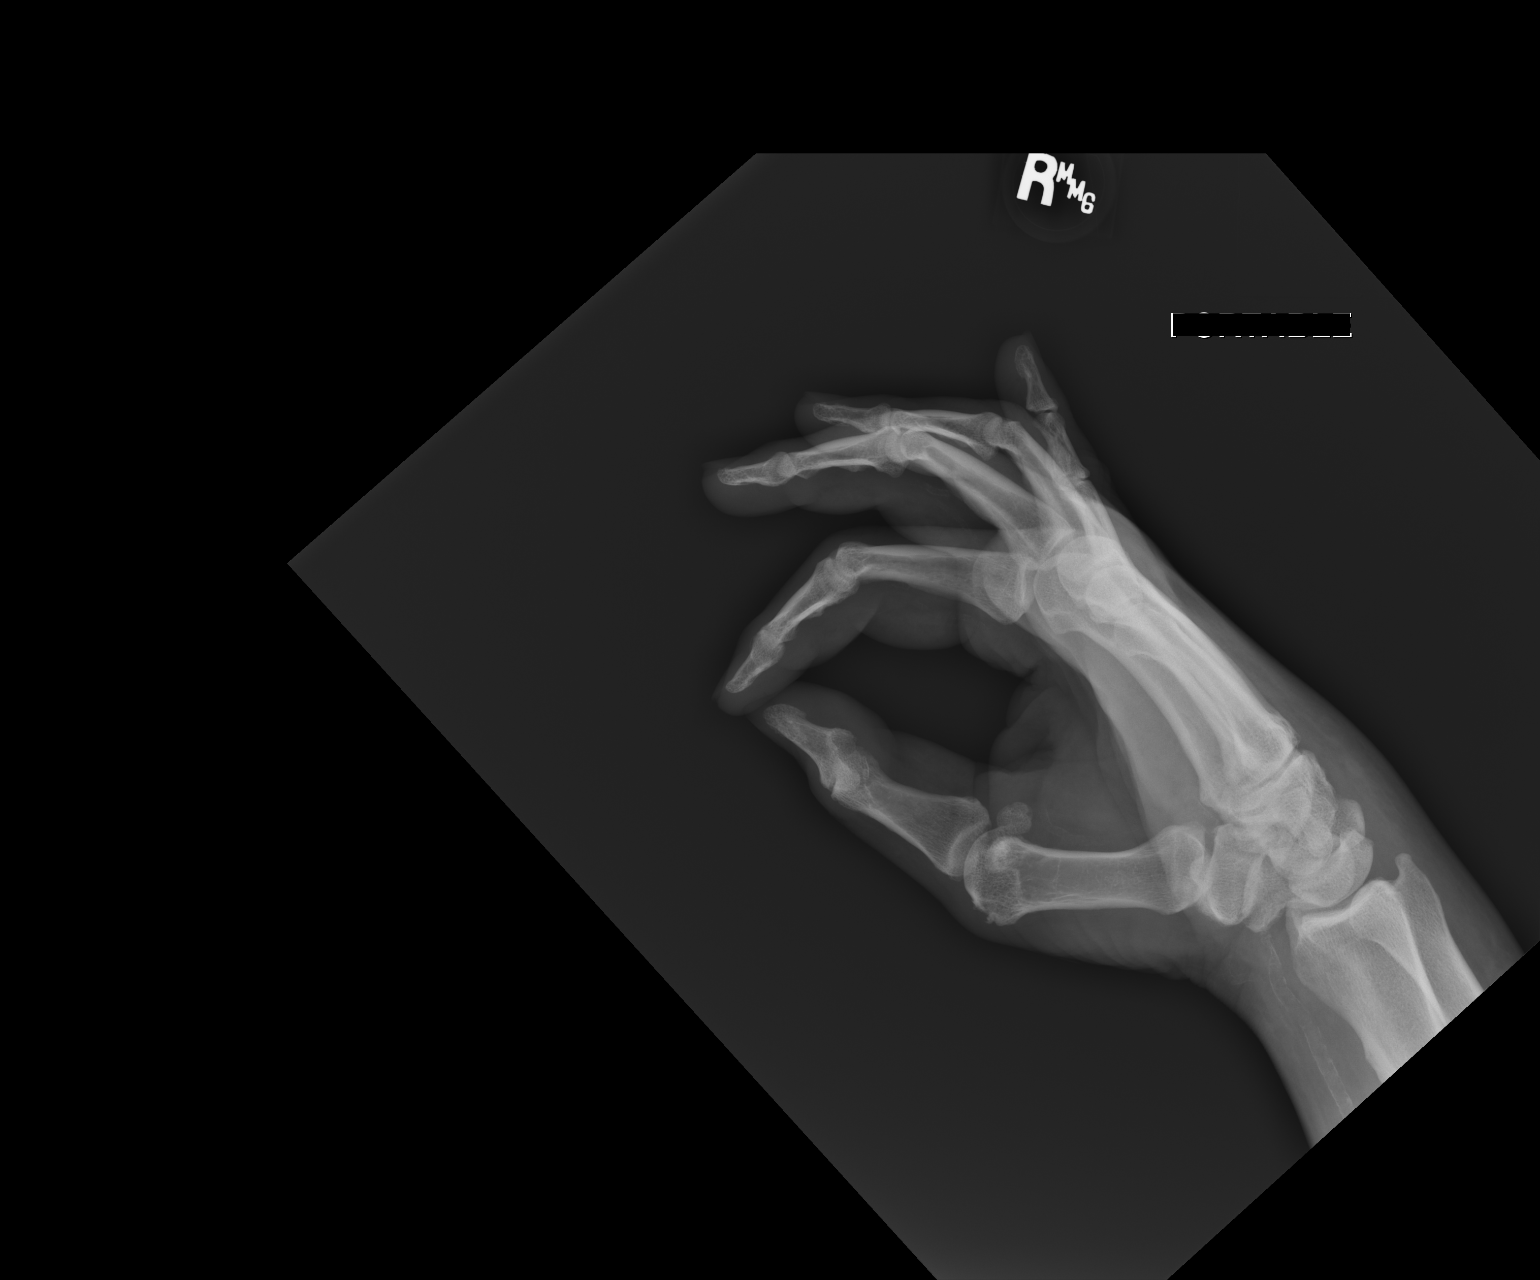

[2 of 2 positions shown; findings below may reference images not displayed]

CLINICAL DATA
Hand pain, rule out fracture

EXAM
RIGHT HAND - 2 VIEW

COMPARISON
None.

FINDINGS
There is a fracture at the base of the fifth proximal phalanx which
appears chronic. The margins are well corticated. Correlate with
history.

Negative for acute fracture.  No significant arthropathy.

IMPRESSION
Chronic fracture base of the fifth proximal phalanx.

SIGNATURE

## 2015-03-12 NOTE — Consult Note (Signed)
Chief Complaint:  Subjective/Chief Complaint seen for anemia- no evidence of gi bleeding.   poor appetite, weakness, slower mentation.  denies nausea or abdominal pain.   VITAL SIGNS/ANCILLARY NOTES: **Vital Signs.:   21-Sep-15 00:19  Vital Signs Type Q 4hr  Temperature Temperature (F) 100.1  Celsius 37.8  Temperature Source oral  Pulse Pulse 97  Respirations Respirations 24  Systolic BP Systolic BP 749  Diastolic BP (mmHg) Diastolic BP (mmHg) 57  Mean BP 78  Pulse Ox % Pulse Ox % 93  Pulse Ox Activity Level  At rest  Oxygen Delivery 2L    04:02  Vital Signs Type Routine  Temperature Temperature (F) 99.7  Celsius 37.6  Temperature Source oral  Pulse Pulse 92  Respirations Respirations 24  Systolic BP Systolic BP 449  Diastolic BP (mmHg) Diastolic BP (mmHg) 67  Mean BP 88  Pulse Ox % Pulse Ox % 93  Pulse Ox Activity Level  At rest  Oxygen Delivery 2L   Brief Assessment:  GEN obese   Cardiac Regular   Respiratory clear BS   Gastrointestinal details normal Soft  Nontender  Bowel sounds normal   Lab Results: Routine Chem:  21-Sep-15 05:52   Result Comment - SLIGHT PLATELET CLUMPING IN SPECIMEN. ACTUAL  - NUMERICAL COUNT MAY BE SOMEWHAT HIGHER THAN  - THE REPORTED VALUE.  Result(s) reported on 09 Aug 2014 at 08:43AM.  Glucose, Serum  157  BUN  126  Creatinine (comp)  7.16  Sodium, Serum 137  Potassium, Serum  5.2  Chloride, Serum  109  CO2, Serum  19  Calcium (Total), Serum  8.3  Anion Gap 9  Osmolality (calc) 318  eGFR (African American)  10  eGFR (Non-African American)  8 (eGFR values <34m/min/1.73 m2 may be an indication of chronic kidney disease (CKD). Calculated eGFR is useful in patients with stable renal function. The eGFR calculation will not be reliable in acutely ill patients when serum creatinine is changing rapidly. It is not useful in  patients on dialysis. The eGFR calculation may not be applicable to patients at the low and high extremes  of body sizes, pregnant women, and vegetarians.)  Routine Hem:  16-Sep-15 12:37   Hemoglobin (CBC)  6.1  17-Sep-15 05:08   Hemoglobin (CBC)  6.1    20:18   Hemoglobin (CBC)  6.9  18-Sep-15 06:15   Hemoglobin (CBC)  6.7    20:12   Hemoglobin (CBC)  7.5 (Result(s) reported on 06 Aug 2014 at 08:38PM.)  19-Sep-15 04:52   Hemoglobin (CBC)  7.1  20-Sep-15 04:53   Hemoglobin (CBC)  6.8 (Result(s) reported on 08 Aug 2014 at 05:45AM.)  21-Sep-15 05:52   WBC (CBC)  30.1  RBC (CBC)  2.42  Hemoglobin (CBC)  6.5  Hematocrit (CBC)  21.0  Platelet Count (CBC)  149  MCV 87  MCH 26.9  MCHC  31.1  RDW  16.2  Neutrophil % 92.6  Lymphocyte % 4.0  Monocyte % 2.8  Eosinophil % 0.4  Basophil % 0.2  Neutrophil #  27.9  Lymphocyte # 1.2  Monocyte # 0.8  Eosinophil # 0.1  Basophil # 0.0 (Result(s) reported on 09 Aug 2014 at 08:00AM.)  Segmented Neutrophils 92  Lymphocytes 6  Monocytes 2  Diff Comment 1 ANISOCYTOSIS  Diff Comment 2 HYPOCHROMIA  Diff Comment 3 PLTS VARIED IN SIZE  Result(s) reported on 09 Aug 2014 at 08:00AM.  Manual Diff MANUAL DIFF DONE   Assessment/Plan:  Assessment/Plan:  Assessment 1) anemia, no  evidence of significant GI bleeding. no asterixis.  2) sepsis-on abx, ID following.  3) renal failure-HD arranged for today 4) no bm for several days.   Plan 1) patient on stool softener.  Will check 2 way abd film.  2) continue ulcer prophylaxis with ppi.   Electronic Signatures: Loistine Simas (MD)  (Signed 21-Sep-15 12:23)  Authored: Chief Complaint, VITAL SIGNS/ANCILLARY NOTES, Brief Assessment, Lab Results, Assessment/Plan   Last Updated: 21-Sep-15 12:23 by Loistine Simas (MD)

## 2015-03-12 NOTE — Consult Note (Signed)
Details:   - Pt seen. Full note to follow. Consult for right foot ulcer. Recent admit with sepsis. Small dorsal foot wound.  Appears very superficial. Proximal right lower leg ulcer with fibrosis and mild drainage on bandage.  To be seen by general surgery.  Doubt foot wound is an issue.  Continue with local wound care.  No surgical debridment needed.  Will defer further treatment of more porxiam wound to gen surg.   Electronic Signatures: Gwyneth RevelsFowler, Rekisha Welling (MD)  (Signed 06-Aug-15 08:20)  Authored: Details   Last Updated: 06-Aug-15 08:20 by Gwyneth RevelsFowler, Vail Vuncannon (MD)

## 2015-03-12 NOTE — Op Note (Signed)
PATIENT NAME:  Angel Mays, [name] MR#:  [id number] DATE OF BIRTH:  [birth year]  DATE OF PROCEDURE:  08/08/2014  PREOPERATIVE DIAGNOSES: 1.  Acute on chronic renal failure with no improvement with conservative management.  2.  Bacteremia.  3.  Right lower extremity wound.  4.  Morbid obesity.  POSTOPERATIVE DIAGNOSES:  1.  Acute on chronic renal failure with no improvement with conservative management.  2.  Bacteremia.  3.  Right lower extremity wound.  4.  Morbid obesity.  PROCEDURES:  1. Ultrasound guidance for vascular access to the left femoral vein.  2. Placement of non-tunneled dialysis catheter into the left femoral vein.  SURGEON:  Annice NeedyJason S. Dew, MD  ANESTHESIA: Local.   BLOOD LOSS: Minimal.   INDICATION FOR PROCEDURE: A 44 year old morbidly obese male who has been in the hospital for several days. His renal function continues to worsen. He had known chronic kidney disease, and his BUN is now well over 100. He needs dialysis today. We are asked to place a dialysis catheter.   DESCRIPTION OF THE PROCEDURE: The patient's left groin was sterilely prepped and draped and a sterile surgical field was created. The left femoral vein was visualized with ultrasound and found to be patent. It was then accessed under direct ultrasound guidance without difficulty with a Seldinger needle and a permanent image was recorded. A J-wire was placed after skin nick and dilatation. The 30 cm Trialysis-type dialysis catheter was then placed over the wire and the wire was removed. All lumens withdrew blood well and flushed easily with sterile saline. It was secured to the skin with 3 nylon sutures. A sterile dressing was placed. The patient tolerated the procedure well and remained in his bed in a stable condition.    ____________________________ Annice NeedyJason S. Dew, MD jsd:LT D: 08/08/2014 13:23:00 ET T: 08/08/2014 19:42:46 ET JOB#: [id number]  cc: Annice NeedyJason S. Dew, MD, <Dictator> Annice NeedyJASON S DEW MD ELECTRONICALLY  SIGNED 08/31/2014 10:18

## 2015-03-12 NOTE — Consult Note (Signed)
PATIENT NAME:  Angel Mays, SOTOMAYOR MR#:  [id number] DATE OF BIRTH:  [birth year]  DATE OF CONSULTATION:  08/09/2014  REFERRING PHYSICIAN:  Herschell Dimes. Renae Gloss, MD  CONSULTING PHYSICIAN:  Kadisha Goodine R. Sherrlyn Hock, MD  REASON FOR CONSULTATION: Persistent anemia despite transfusion.   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 Sampson Goon. The patient had labs done on September 15 which showed worsening creatinine and elevated white blood count with worsening anemia. The patient has been on outpatient treatment for anemia at [facility] with Procrit injections given once every 4 weeks, he has not required Procrit injections each time since hemoglobin in July was as high as 11. Labs done during this admission, however, show that hemoglobin on September 16 was down to 6.1, platelets 139,000,  WBC 21,900, creatinine was 5.71, calcium 8.0, bilirubin 1.3, and LFT otherwise unremarkable. Albumin low at 2.1. Serum iron was low at 14, iron saturation 12%, and TIBC low at 119. Ferritin elevated at 1564. The patient has just started on dialysis today. He has received packed red blood cell transfusion. CBC done earlier today again showed hemoglobin low at 6.5, platelet count 149,000, and WBC 13,100 with neutrophils of 92%. Stool Hemoccult negative x 1. Also seen by GI, Dr. Marva Panda, and felt that it is unlikely to have any occult GI blood loss at this time.   Clinically, the patient is resting in bed, arousable, but otherwise weak and drowsy. Denies any fevers. Denies obvious bleeding issues. Appetite is decreased.   PAST MEDICAL HISTORY AND  SURGICAL HISTORY: As in HPI above. In addition, rotator cuff surgery x 2; amputation, left leg 2013; cataract surgery; abscess on the hand and abdomen.   FAMILY HISTORY: The patient unsure about details.   SOCIAL HISTORY: Has been at [facility] recently. Denies any smoking, alcohol, or recreational drug usage.   ALLERGIES: INCLUDE CONTRAST DYE.   HOME MEDICATIONS: Abilify 5 mg once daily; Tylenol p.r.n.; allopurinol 100 mg daily; amlodipine 10 mg daily; Bactrim DS b.i.d. x 10 days; clonidine 0.2 mg b.i.d.; Coreg 25 mg b.i.d.; Eliquis 5 mg b.i.d.; ferrous sulfate 325 mg daily; Lac-Hydrin topical cream as needed; Levemir insulin 15 units at bedtime; mirtazapine 30 mg at bedtime; Nephro-Vite B complex 1 tablet daily; OxyContin 10 mg b.i.d.; pentoxifylline 400 mg extended release t.i.d.; Pepcid 20 mg b.i.d.; Phenergan 25 mg q. 6 hours p.r.n.; Risa-Bid 1 tablet b.i.d.; Santyl 250 units/g topical ointment; Senna Plus 1 tablet b.i.d.; sertraline 100 mg 1-1/2 tablets daily; vitamin D3, 5000 units once a day except weekends; Zofran 8 mg q. 6 hours p.r.n. for nausea.   REVIEW OF SYSTEMS:  CONSTITUTIONAL: As in HPI. Recent fevers and has been on IV antibiotics, been on Bactrim orally.  HEENT: No headaches. Has intermittent dizziness. No epistaxis, ear or jaw pain.  CARDIAC: Has dyspnea on exertion or minimal activity. Denies any orthopnea or PND. No chest pain currently.  GASTROINTESTINAL: Decreased oral intake. Otherwise, no nausea, vomiting, or diarrhea. No bright red blood in stools or melena.  GENITOURINARY: No dysuria or hematuria.  SKIN: No new rashes, has easy bruising.  HEMATOLOGIC: Denies any other obvious bleeding symptoms.  MUSCULOSKELETAL: Has chronic aches and pains, which  is unchanged. Denies new bone pains.  NEUROLOGIC: No new focal weakness, seizures or loss of consciousness.  ENDOCRINE: No polyuria or polydipsia.   PHYSICAL EXAMINATION:  GENERAL: Patient is weak, drowsy,  otherwise arousable and answers simple questions appropriately. No acute distress. Pallor present.  VITAL SIGNS: Temperature 98.2, heart rate 87, respirations 22, blood pressure 132/53, saturating 94% on 2 L oxygen.  HEENT: Normocephalic, atraumatic with extraocular movements intact. Sclerae anicteric.  NECK: Negative for lymphadenopathy.  CARDIOVASCULAR: S1, S2, regular rate and rhythm.  LUNGS: Bilateral good air entry, decreased at bases, no rhonchi noted.  ABDOMEN: Soft, nontender, obese. Difficult to appreciate organomegaly.  EXTREMITIES: Show bilateral edema.  SKIN: Shows no generalized rashes or major bruising.  LYMPHATICS: No adenopathy in the axillary or inguinal areas.   LABORATORY RESULTS: As in HPI above.   IMPRESSION AND RECOMMENDATIONS: A 44 year old gentleman with a known history of multiple medical problems as described above including anemia of chronic disease and has been on Procrit and iron treatment as outpatient, with hemoglobin in July doing better, as high as 11 grams, currently admitted to hospital with a recent prolonged course of methicillin-resistant Staphylococcus aureus bacteremia, suspected endocarditis, and vancomycin treatment. Also has taken oral Bactrim as outpatient. Hemoglobin has significantly dropped. He also has developed progressive renal insufficiency and has started on hemodialysis today. Anemia in his case is likely multifactorial, given known anemia of chronic disease, iron deficiency, currently progressive renal insufficiency, recent bacteremia/endocarditis, systemic antibiotics. Agree with ongoing transfusion support. We will discuss with nephrology regarding continuing on ESA therapy and IV iron supplement as indicated, given that he is now on dialysis. Otherwise, will check B12, folate, direct Coombs test. No other new recommendations. We will continue to follow. The patient and family were present, explained above, agreeable to this plan.   Thank you for  the referral. Please feel free to contact me if any additional questions.    ____________________________ Maren ReamerSandeep R. Sherrlyn HockPandit, MD srp:ST D: 08/09/2014 23:32:51 ET T: 08/10/2014 00:10:06 ET JOB#: [id number]  cc: Darryll CapersSandeep R. Sherrlyn HockPandit, MD, <Dictator> Wille CelesteSANDEEP R Lis Savitt MD ELECTRONICALLY SIGNED 08/11/2014 15:20

## 2015-03-12 NOTE — Consult Note (Signed)
Brief Consult Note: Diagnosis: Rt leg cellulitis.   Patient was seen by consultant.   Recommend further assessment or treatment.   Comments: rt leg cellulitis, prior left BKA recent dressing placed (1hr ago) will reexamine in am currently on IV abx.  Electronic Signatures: Lattie Hawooper, Richard E (MD)  (Signed 05-Aug-15 17:46)  Authored: Brief Consult Note   Last Updated: 05-Aug-15 17:46 by Lattie Hawooper, Richard E (MD)

## 2015-03-12 NOTE — Consult Note (Signed)
Admit Diagnosis:   SEPSIS: Onset Date: 25-Jun-2014, Status: Active, Description: SEPSIS    Multi-drug Resistant Organism (MDRO): Positive culture for MRSA., 22-Jun-2014  Home Medications: Medication Instructions Status  Levaquin 750 mg oral tablet 1 tab(s) orally once a day for 10 days Active  Levemir FlexPen 100 units/mL subcutaneous solution 15 unit(s) subcutaneous 2 times a day Active  loratadine 10 mg oral tablet 1 tab(s) orally once a day (in the morning) Active  metoclopramide 5 mg oral tablet 1 tab(s) orally 3 times a day Active  OxyCONTIN 10 mg oral tablet, extended release 1 tab(s) orally 2 times a day Active  promethazine 25 mg oral tablet 1 tab(s) orally every 6 hours, As Needed - for Nausea, Vomiting Active  Risa-Bid - oral tablet 1 tab(s) orally 2 times a day Active  Senna Plus 50 mg-8.6 mg oral tablet 1 tab(s) orally 2 times a day Active  sertraline 100 mg oral tablet 1.5 tab(s) orally once a day Active  sevelamer carbonate carbonate 800 mg oral tablet 2 tab(s) orally 3 times a day Active  traZODone 50 mg oral tablet 1 tab(s) orally once a day (at bedtime) Active  Vitamin D3 5000 intl units oral tablet 1 tab(s) orally once a day (in the afternoon) except on Saturday and Sunday Active  Zyloprim 100 mg oral tablet 1 tab(s) orally once a day Active  pentoxifylline 400 mg oral tablet, extended release 1 tab(s) orally 3 times a day Active  Pepcid 20 mg oral tablet 1 tab(s) orally 2 times a day Active  cloNIDine 0.2 mg oral tablet 1 tab(s) orally 2 times a day Active  scopolamine 1.5 mg transdermal film, extended release 1 patch transdermal every 72 hours, As Needed - for Nausea, Vomiting Active  Nephro-Vite Vitamin B Complex with C and Folic Acid oral tablet 1 tab(s) orally once a day Active  Eliquis 5 mg oral tablet 1 tab(s) orally 2 times a day Active  Coreg 25 mg oral tablet 1 tab(s) orally 2 times a day Active  Norvasc 10 mg oral tablet 1 tab(s) orally once a day Active   acetaminophen 325 mg oral tablet 2 tab(s) orally every 4 hours, As Needed - for Fever Active  ferrous sulfate 325 mg oral tablet 1 tab(s) orally once a day Active  Flonase 50 mcg/inh nasal spray 2 spray(s) nasal once a day Active  hydrALAZINE 100 mg oral tablet 1 tab(s) orally 3 times a day Active   Lab Results: Routine Micro:  04-Aug-15 16:04   Micro Text Report BLOOD CULTURE   ORGANISM 1                METHICILLIN RESISTANT STAPH.AUREUS   COMMENT                   IN AEROBIC AND ANAEROBIC BOTTLES   COMMENT                   -   GRAM STAIN                GRAM POSITIVE COCCI   ANTIBIOTIC   ORG#1    ORG#2     CIPROFLOXACIN                 R                  CLINDAMYCIN                   R  ERYTHROMYCIN                  R                  GENTAMICIN                    S                  LEVOFLOXACIN  R                  LINEZOLID                     S                  OXACILLIN                     R                  TIGECYCLINE                   S                  VANCOMYCIN                    S                  CEFOXITIN SCREEN POSITIVE           INDUCIBLE CLINDAMYCIN RESISTANNEGATIVE  05-Aug-15 14:10   Micro Text Report WOUND AER/ANAEROBIC CULT   ORGANISM 1                MODERATE GROWTH STAPHYLOCOCCUS AUREUS   COMMENT                   SENSITIVITIES TO FOLLOW   GRAM STAIN                RARE WHITE BLOOD CELLS   GRAM STAIN                MODERATE GRAM POSITIVE COCCI IN CLUSTERS   ANTIBIOTIC                         Contrast dye: Other   General Aspect Pt with sepsis and foot ulcer.  Consult for evaluation of foot wound.   Case History and Physical Exam:  Cardiovascular Non-palpable pulses 2ndary to edema.   Musculoskeletal s/p left bka   Neurological Neuropathic   Skin Brawny edema with severe icthyosis.  Superfical abrasion like wound dorsal foot.  Proximal lower leg ulceration with fibrotic tissue.    Impression Foot wound is minimal. C/W local  wound care. Will defer proximal leg wound to general surgery. will sign off.   Electronic Signatures: Gwyneth Revels (MD)  (Signed 07-Aug-15 13:03)  Authored: Health Issues, Significant Events - History, Home Medications, Labs, Allergies, General Aspect/Present Illness, History and Physical Exam, Impression/Plan   Last Updated: 07-Aug-15 13:03 by Gwyneth Revels (MD)

## 2015-03-12 NOTE — Consult Note (Signed)
PATIENT NAME:  Angel Mays, Angel Mays MR#:  [id number] DATE OF BIRTH:  February 21, 1971  DATE OF CONSULTATION:  08/05/2014  REFERRING PHYSICIAN:   CONSULTING PHYSICIAN:  Stann Mainlandavid P. Sampson GoonFitzgerald, MD  REQUESTING PHYSICIAN:  Dr. Renae GlossWieting  REASON FOR CONSULTATION: Sepsis and bacteremia.   HISTORY OF PRESENT ILLNESS: This is a 44 year old gentleman, with a complicated medical history including diabetes, hypertension, prior chronic ulceration of the left upper leg status post left below-knee amputation with a recent admission in August of this year with methicillin-resistant Staphylococcus aureus bacteremia, presumably from a lower extremity wound source. The patient was treated at that time with vancomycin and discharged to [facility]. He was seen as an outpatient 2 days ago and had stopped antibiotics after a 4 week course. Labs were done at that time and white count was found to be 26,000 with a hemoglobin of 6 and worsening acute on chronic renal failure. Blood cultures were done as an outpatient as well and are now positive as are subsequent blood cultures from yesterday when he was admitted. We are consulted for further antibiotic management.   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    PAST SURGICAL HISTORY:  1.  Below-the-knee amputation in 2013.  2.  Right rotator cuff surgery x 2.  3.  Right hand surgery.   FAMILY HISTORY: Positive for diabetes and hypertension.   SOCIAL HISTORY: He is single. He lives at [facility].  He is very sedentary.  He does not smoke.    ALLERGIES:  He is allergic to CONTRAST DYE.  REVIEW OF SYSTEMS:  11 systems reviewed and negative except as per history of present illness.   PHYSICAL  EXAMINATION:  VITAL SIGNS: Temperature 99.1, pulse 92, blood pressure 129/65, respirations 18, saturation 100% on 3 liters. He is morbidly obese.  He is awake and interactive.   HEENTx:  Pupils equal, round, and reactive to light and accommodation. Extraocular movements are intact. Sclerae are anicteric.  Oropharynx was clear. Mucous membranes are dry. No thrush.  NECK:  Supple.  HEART: Very distant.  LUNGS: Clear bilaterally.  ABDOMEN: Obese, soft, nontender.  EXTREMITIES: He has a left below-knee amputation. The site has chronic thickened hyperkeratotic skin but no ulceration.  In his right leg, he has marked edema as well as hyperkeratotic changes on the lower half.  He has a right wound over his lateral calf.  There is some necrotic -appearing fatty tissue there.   NEUROLOGIC: He is alert and oriented x3. Grossly nonfocal neuro exam.   DATA: White blood count on admission was 21.9, currently is 21, hemoglobin 6.1, platelets 109,000  down from 139,000. Blood cultures, 2 of 2, are growing gram-positive cocci. Urinalysis showed 22 white cells.  Clostridium difficile testing is negative. Renal function showed a BUN and creatinine on admission of 87/5.71; currently it is 96/5.82.   IMAGING:  Chest x-ray showed volume overload; his heart is enlarged with pulmonary venous hypertension; no adenopathy or infiltrate.   IMPRESSION: A 44 year old quite complicated medical history including diabetes, hypertension, morbid obesity, chronic kidney disease, left below-knee amputation, right chronic venous ulceration, deep venous thrombosis, and inferior vena cava filter placement with a recent admission for methicillin-resistant Staphylococcus aureus bacteremia associated with right lower extremity infection.  He was treated with 4 weeks of IV  vancomycin.  Transthoracic echocardiogram done at that time was negative for any evidence of endocarditis.  A transesophageal echocardiogram was not done.  He had  persistently positive blood cultures but did finally clear them on that admission.  His blood cultures are now positive again and he has had worsening acute on chronic renal failure with a white count of 26,000, and profound anemia down to 6.1. The likely source is either recurrent infection in the right lateral calf wound or persistent bacteremia potentially associated with endocarditis, other endovascular infection, or other metastatic sites of infection.   RECOMMENDATIONS:  1.  Continue daptomycin with renal dosing.  It would be best to avoid vancomycin at this time given his much worse renal failure.  2.  Repeat blood cultures after the line is removed.  3.  I have ordered removal of his PICC line.  4.  Once his blood cultures are clear, we can replace a PICC line, and he will need potentially another 4 to 6 weeks of treatment. 5.  His right lower extremity has quite chronic changes, could also be active stasis ulceration. Again, this is likely the source of infection. He will be seen by vascular surgery.   Thank you for the consult. I will be glad to follow with you.   ____________________________ Stann Mainland. Sampson Goon, MD dpf:nr D: 08/05/2014 18:24:13 ET T: 08/05/2014 18:49:23 ET JOB#: [id number]  cc: Stann Mainland. Sampson Goon, MD, <Dictator> Herschell Dimes. Renae Gloss, MD Anaka Beazer Sampson Goon MD ELECTRONICALLY SIGNED 08/22/2014 23:53

## 2015-03-12 NOTE — Consult Note (Signed)
Chief Complaint:  Subjective/Chief Complaint Please see full GI consult.  Patietn seen and examined. Asked to see patient in regard to anemia and reported heme positive stool.  Patietn denies any evidence of GI bleeding, bloody or black stools, hematemesis etc.  He was heme negative on documented hemoccult yesterday.  I have reviewed both this hospital chart and his last admission, and although it shows a decline of hgb there is no documentation of stools, emesis etc to support GI blood loss of over 2 units. Patietn has a known h/o ACD secondary to kidney disease adn was evaluated by Dr Sherrlyn HockPandit earlier this summer as o/p for this problem.     DDx for this clinical situation would include possible dilutional (patietn is 8# above admission and baseline weight on this admission) as well as hemolysis or other bleeding site such as retroperitoneal.  Of note patient is on eliquis.   I have not found evidence to support significant GI blood loss or active GI bleeding.  I would not recommend sedated luminal evaluation under the current clinical situation, however recommend evaluation for possible hemolysis, retic count, coombs testing, urine hemosiderin, serum haptoglobin,  and hematology consult.  If there is not adequate response to current transfusion, consider ct imaging of the retroperitoneum.  Following.   Electronic Signatures: Barnetta ChapelSkulskie, Martin (MD)  (Signed 18-Sep-15 18:04)  Authored: Chief Complaint   Last Updated: 18-Sep-15 18:04 by Barnetta ChapelSkulskie, Martin (MD)

## 2015-03-12 NOTE — Consult Note (Signed)
PATIENT NAME:  Angel GuanSCOTT, Sahid MR#:  [id number] DATE OF BIRTH:  Jun 27, 1971  DATE OF CONSULTATION:  06/25/2014  REFERRING PHYSICIAN:  Dr. Nemiah CommanderKalisetti  CONSULTING PHYSICIAN:  Stann Mainlandavid P. Sampson GoonFitzgerald, MD  REASON FOR CONSULTATION: MRSA bacteremia and wound infection.   HISTORY OF PRESENT ILLNESS: This is a 44 year old gentleman who lives at a rehab facility and has a history of diabetes, hypertension, obesity, and amputation of his left leg in the past. Currently he has been dealing with chronic ulcers on his right leg. He is wheelchair bound. He was in his usual state of health until the last few days when he developed fevers, headaches, vomiting and chills. He was started on oral levofloxacin but progressed and so he was brought to the ER where he was febrile, septic. Since then he has been treated with IV antibiotics and his blood cultures are growing MRSA. His wound culture is also growing MRSA. We are consulted for further antibiotics.   The patient reports feeling somewhat better since then.   PAST MEDICAL HISTORY: Diabetes, hypertension, chronic ulcers of the legs, cataract, depression, prior possible DVT.  PAST SURGICAL HISTORY: Below the knee amputation in 2013 on the left and cataract surgery.   FAMILY HISTORY: Noncontributory.   SOCIAL HISTORY: He quit tobacco 14 years ago. He does not drink.   REVIEW OF SYSTEMS: Eleven systems reviewed and negative, except as per HPI.   ANTIBIOTICS SINCE ADMISSION: Include vancomycin and levofloxacin. He also received a dose of linezolid and he has been on Zosyn.   PHYSICAL EXAMINATION: VITAL SIGNS: Temperature 98.4, pulse 85, blood pressure 135/74, respirations 20, sat 93% on room air.  GENERAL: He is morbidly obese, lying in bed, in no acute distress.  HEENT: Pupils equal, round and reactive to light and accommodation. Extraocular movements are intact. Sclerae are anicteric. Oropharynx clear.  NECK: Supple.  HEART: Regular.  ABDOMEN: Obese, soft,  nontender.  EXTREMITIES: Left below-knee amputation site has no ulcers but chronic skin changes. His right leg is wrapped in an Radio broadcast assistantUnna boot. Per report he has some wounds on his shins as well as on his feet. Dr. Excell Seltzerooper has seen him for this.   DIAGNOSTIC DATA: Blood cultures from August 4th are growing 2 out of 2 MRSA sensitive to gentamicin, vancomycin, linezolid and tigecycline, but resistant to clindamycin, oxacillin, ciprofloxacin, erythromycin and levofloxacin. Wound culture is also growing MRSA, sensitivities pending.  White blood count on admission was 11.4, currently is 8.6. Hemoglobin 8.4 and platelets 118,000. Renal function shows a creatinine of 3.22, down from 4.30. Troponins were borderline positive.   Imaging: Ultrasound of abdomen is negative.  Chest x-ray shows no acute abnormalities.   IMPRESSION: A 44 year old with diabetes, hypertension, chronic kidney disease and status post amputation of the left leg now with methicillin-resistant Staphylococcus aureus bacteremia from a right leg ulcer source. He has been seen by surgery.   RECOMMENDATIONS: 1.  Repeat blood cultures to document clearance.  2.  Check an echocardiogram.  3.  Continue vancomycin. He will need a PICC placed once his blood cultures are negative x48 hours. He will need at least 2 weeks of IV vancomycin.  4.  If he has a negative echocardiogram and a PICC is placed, he can be discharged and I can see him in 2 to 4 weeks.   Thank you for the consult. I will be glad to follow with you.   ____________________________ Stann Mainlandavid P. Sampson GoonFitzgerald, MD dpf:sb D: 06/25/2014 13:02:40 ET T: 06/25/2014 13:52:55 ET JOB#: [id number]  cc: Cheral Marker. Ola Spurr, MD, <Dictator> Sion Reinders Ola Spurr MD ELECTRONICALLY SIGNED 06/25/2014 22:10

## 2015-03-12 NOTE — Consult Note (Signed)
CHIEF COMPLAINT and HISTORY:  Subjective/Chief Complaint right leg ulcer, infection   History of Present Illness Patient is admitted with recurrent bacteremia.  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  This was non-traumatic.  It is mildly painful.  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  This ulcer is in the lateral upper calf area.  It has mild fibrinous exudate and necrotic eschar.  It has not improved with local wound care.  he reports no inciting events or exacerbating factors.  He reports having outpatient vascular studies to assess his flow for healing several months ago.  These are not immediately availabe.  Per his report, they showed no significant blackages in the arteries and evidence of old clots in the veins.   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  ALLERGIES:  Allergies:  Omnipaque 140: Dysuria  Contrast dye: Other  HOME MEDICATIONS:  Home Medications: Medication Instructions Status  Bactrim DS 800 mg-160 mg oral tablet 1 tab(s) orally 2 times a day for 10 days Active  Risa-Bid - oral tablet 1 tab(s) orally 2 times a day Active  Senna Plus 50 mg-8.6 mg oral tablet 1 tab(s) orally 2 times a day Active  sertraline 100 mg oral tablet 1.5 tab(s) orally once a day Active  pentoxifylline 400 mg oral tablet, extended release 1 tab(s) orally 3 times a day Active  Vitamin D3 5000 intl units oral tablet 1 tab(s) orally once a day except on Saturday and Sunday Active  OxyCONTIN 10 mg oral tablet, extended release 1 tab(s) orally 2 times a day Active  mirtazapine 30 mg oral tablet 1 tab(s) orally once a day (at bedtime) Active  Pepcid 20 mg oral tablet 1 tab(s) orally 2 times a day Active  cloNIDine 0.2 mg oral tablet 1 tab(s) orally 2 times a day Active  Nephro-Vite Vitamin B Complex with C and Folic Acid oral tablet 1 tab(s) orally once a  day Active  Eliquis 5 mg oral tablet 1 tab(s) orally 2 times a day Active  Coreg 25 mg oral tablet 1 tab(s) orally 2 times a day Active  acetaminophen 325 mg oral tablet 2 tab(s) orally every 4 hours, As Needed - for Fever or pain Active  ferrous sulfate 325 mg oral tablet 1 tab(s) orally once a day Active  Abilify 5 mg oral tablet 1 tab(s) orally once a day Active  allopurinol 100 mg oral tablet 1 tab(s) orally once a day Active  amLODIPine 10 mg oral tablet 1 tab(s) orally once a day Active  Lac-Hydrin 12% topical cream Apply topically to affected area on right lower extremity 3 times a day Active  Levemir FlexTouch 100 units/mL subcutaneous solution 15 unit(s) subcutaneous once a day (at bedtime) Active  Phenergan 25 mg/mL injectable solution 1 milliliter(s) intramuscular every 6 hours, As Needed - for Nausea, Vomiting Active  Santyl 250 units/g topical ointment Apply topically to affected area on right lateral calf and right dorsal foot once a day Active  Zofran 8 mg oral tablet 1 tab(s) orally every 6 hours, As Needed for nausea Active   Family and Social History:  Family History Non-Contributory   Social History negative tobacco, negative ETOH, negative Illicit drugs   Place of Living Nursing Home   Review of Systems:  Subjective/Chief Complaint Positive for fatigue and lethargy, positive for recurrent infection, positive for skin ulcer of RLE, positive  for previous BKA, positive for anemia No TIA/stroke/seizure No chest pain or palpitations   Fever/Chills Yes   Cough No   Sputum No   Abdominal Pain No   Diarrhea No   Constipation No   Nausea/Vomiting No   SOB/DOE No   Chest Pain No   Telemetry Reviewed NSR   Dysuria No   Tolerating PT Yes   Tolerating Diet Yes   Physical Exam:  GEN well developed, well nourished, obese   HEENT pink conjunctivae, hearing intact to voice   NECK No masses  trachea midline   RESP normal resp effort  no use of accessory  muscles   CARD regular rate  LE edema present   VASCULAR ACCESS none   ABD denies tenderness  soft   GU no superpubic tenderness   LYMPH negative neck, negative axillae   EXTR negative cyanosis/clubbing, positive edema, He is s/p left BKA.  Has marked stasis dermatitis of the right leg and the lower part of the remaining left leg.  The right DP is palpable, 1-2+.  Right PT not easily palpable but he has pronouced swelling.  His RLE edema is 2-3 +   SKIN positive ulcers, 6-8 cm ulceration right lateral calf with fibrinous exudate and necrotic eschar.  Minimal surrounding erythema.   NEURO cranial nerves intact, motor/sensory function intact   PSYCH alert, A+O to time, place, person   LABS:  Laboratory Results: Hepatic:    16-Sep-15 12:37, Comprehensive Metabolic Panel  Bilirubin, Total 1.3  Alkaline Phosphatase 111  46-116  NOTE: New Reference Range  06/08/14  SGPT (ALT) 14  14-63  NOTE: New Reference Range  06/08/14  SGOT (AST) 27  Total Protein, Serum 6.2  Albumin, Serum 2.1  Routine BB:    16-Sep-15 12:37, Crossmatch 1 Unit  Crossmatch Unit 1 Ready  Result(s) reported on 05 Aug 2014 at 12:21PM.    16-Sep-15 12:37, Crossmatch 2 Units  Crossmatch Unit 1   Transfused  Crossmatch Unit 2   Cancelled   Result(s) reported on 05 Aug 2014 at 07:45AM.    16-Sep-15 12:37, Type and Antibody Screen  ABO Group + Rh Type   AB Positive  Antibody Screen NEGATIVE  Result(s) reported on 04 Aug 2014 at 02:02PM.    16-Sep-15 16:34, Crossmatch 1 Unit  Crossmatch Unit 1 -  Routine Micro:    16-Sep-15 12:37, Blood Culture  Micro Text Report   BLOOD CULTURE    COMMENT                   IN AEROBIC AND ANAEROBIC BOTTLES    GRAM STAIN                GRAM POSITIVE COCCI IN CLUSTERS     ANTIBIOTIC  Culture Comment   IN AEROBIC AND ANAEROBIC BOTTLES  Gram Stain 1   GRAM POSITIVE COCCI IN CLUSTERS  Micro Text Report   BLOOD CULTURE    COMMENT                   IN AEROBIC AND  ANAEROBIC BOTTLES    GRAM STAIN                GRAM POSITIVE COCCI IN CLUSTERS     ANTIBIOTIC  Culture Comment   IN AEROBIC AND ANAEROBIC BOTTLES  Gram Stain 1   GRAM POSITIVE COCCI IN CLUSTERS    17-Sep-15 04:41, Clostridium Difficile  Micro Text Report   CLOSTRIDIUM DIFFICILE  C.DIFFICILE ANTIGEN       C.DIFFICILE GDH ANTIGEN : NEGATIVE    C.DIFFICILE TOXIN A/B     C.DIFFICILE TOXINS A AND B : NEGATIVE    INTERPRETATION            Negative for C. difficile.      ANTIBIOTIC  Routine Chem:    16-Sep-15 12:37, Blood Culture  Result Comment   BOTH - SKY TO [place] @ 0616/091715   - NOTIFIED OF CRITICAL VALUE   - READ-BACK PROCESS PERFORMED.   Result(s) reported on 05 Aug 2014 at 06:20AM.  Result Comment   BOTH - SKY TO [place] @ 0616/091715   - NOTIFIED OF CRITICAL VALUE   - READ-BACK PROCESS PERFORMED.   Result(s) reported on 05 Aug 2014 at [facility].    16-Sep-15 12:37, Comprehensive Metabolic Panel  Glucose, Serum 167  BUN 87  Creatinine (comp) 5.71  Sodium, Serum 139  Potassium, Serum 4.4  Chloride, Serum 110  CO2, Serum 17  Calcium (Total), Serum 8.0  Osmolality (calc) 308  eGFR (African American) 13  eGFR (Non-African American) 11  eGFR values <44m/min/1.73 m2 may be an indication of chronic  kidney disease (CKD).  Calculated eGFR is useful in patients with stable renal function.  The eGFR calculation will not be reliable in acutely ill patients  when serum creatinine is changing rapidly. It is not useful in   patients on dialysis. The eGFR calculation may not be applicable  to patients at the low and high extremes of body sizes, pregnant  women, and vegetarians.  Anion Gap 12    16-Sep-15 12:37, Crossmatch 2 Units  Result Comment   - PER DR. WEarleen NewportONLY ONE UNIT NEEDED   - AT THIS TIME 1640 08/04/14 SAL    16-Sep-15 12:37, Ferritin ([facility])  Ferritin ([facility]) 1564  Result(s) reported on 04 Aug 2014 at 04:58PM.    16-Sep-15 12:37, Iron and  IBC ([facility])  Iron Binding Capacity (TIBC) 119  Unbound Iron Binding Capacity 105  Iron, Serum 14  Iron Saturation 12  Result(s) reported on 04 Aug 2014 at 04:44PM.    16-Sep-15 12:37, Troponin I  Result Comment   troponin - RESULTS VERIFIED BY REPEAT TESTING.   - READ-BACK PROCESS PERFORMED.   - barb mckeon 08/04/14_0  rdj.   Result(s) reported on 04 Aug 2014 at 01:38PM.    16-Sep-15 16:34, Crossmatch 1 Unit  Result Comment   X MATCH - DUPLICATE ORDER. C DR WEarleen [id number]   - 08/04/14 SAL   Result(s) reported on 04 Aug 2014 at 04:50PM.    16-Sep-15 17:44, Troponin I  Result Comment   TROPONIN - RESULTS VERIFIED BY REPEAT TESTING.   - PREVIOUSLY CALLED TO BARB MCKEON ON   - 08/04/14 AT 1342 [place]. [place]  Result(s) reported on 04 Aug 2014 at 06:33PM.    16-Sep-15 19:42, Troponin I  Result Comment   TROPONIN - RESULTS VERIFIED BY REPEAT TESTING.   - [place]VIOU[place]KE[place] [id number]  - ON 08/04/14 [place] RDJ. [place]  Result(s) reported on 04 Aug 2014 at 08:50PM.    17-Sep-15 036:64 Basic Metabolic Panel (w/Total Calcium)  Glucose, Serum 149  BUN 96  Creatinine (comp) 5.82  Sodium, Serum 140  Potassium, Serum 4.3  Chloride, Serum 113  CO2, Serum 15  Calcium (Total), Serum 8.1  Anion Gap 12  Osmolality (calc) 312  eGFR (African American) 12  eGFR (Non-African American) 11  eGFR values <680mmin/1.73  m2 may be an indication of chronic  kidney disease (CKD).  Calculated eGFR is useful in patients with stable renal function.  The eGFR calculation will not be reliable in acutely ill patients  when serum creatinine is changing rapidly. It is not useful in   patients on dialysis. The eGFR calculation may not be applicable  to patients at the low and high extremes of body sizes, pregnant  women, and vegetarians.    17-Sep-15 05:08, Lipid Profile ([place])  Cholesterol, Serum 67  Triglycerides, Serum 170  HDL (INHOUSE) 5  VLDL Cholesterol Calculated 34  LDL Cholesterol Calculated  28  Result(s) reported on 05 Aug 2014 at 05:43AM.  Cardiac:    16-Sep-15 12:37, Cardiac Panel  CK, Total 31  39-308  NOTE: NEW REFERENCE RANGE   12/21/2013  CPK-MB, Serum 1.0  Result(s) reported on 04 Aug 2014 at 01:38PM.    16-Sep-15 12:37, Troponin I  Troponin I 0.89  0.00-0.05  0.05 ng/mL or less: NEGATIVE   Repeat testing in 3-6 hrs   if clinically indicated.  >0.05 ng/mL: POTENTIAL   MYOCARDIAL INJURY. Repeat   testing in 3-6 hrs if   clinically indicated.  NOTE: An increase or decrease   of 30% or more on serial   testing suggests a   clinically important change    16-Sep-15 17:44, Troponin I  Troponin I 1.10  0.00-0.05  0.05 ng/mL or less: NEGATIVE   Repeat testing in 3-6 hrs   if clinically indicated.  >0.05 ng/mL: POTENTIAL   MYOCARDIAL INJURY. Repeat   testing in 3-6 hrs if   clinically indicated.  NOTE: An increase or decrease   of 30% or more on serial   testing suggests a   clinically important change    16-Sep-15 19:42, Troponin I  Troponin I 1.30  0.00-0.05  0.05 ng/mL or less: NEGATIVE   Repeat testing in 3-6 hrs   if clinically indicated.  >0.05 ng/mL: POTENTIAL   MYOCARDIAL INJURY. Repeat   testing in 3-6 hrs if   clinically indicated.  NOTE: An increase or decrease   of 30% or more on serial   testing suggests a   clinically important change  Routine UA:    17-Sep-15 04:40, Urinalysis  Color (UA) Amber  Clarity (UA) Cloudy  Glucose (UA) Negative  Bilirubin (UA) Negative  Ketones (UA) Negative  Specific Gravity (UA) 1.017  Blood (UA) 1+  pH (UA) 5.0  Protein (UA) 30 mg/dL  Nitrite (UA) Negative  Leukocyte Esterase (UA) Trace  Result(s) reported on 05 Aug 2014 at 06:02AM.  RBC (UA) 22 /HPF  WBC (UA) 22 /HPF  Bacteria (UA) 1+  Epithelial Cells (UA) <1 /HPF  Transitional Epithelial (UA) <1 /HPF  Mucous (UA) PRESENT  Result(s) reported on 05 Aug 2014 at [facility].  Routine Sero:    17-Sep-15 04:41, Occult Blood, Feces  Occult  Blood, Feces NEGATIVE  Result(s) reported on 05 Aug 2014 at [facility].  Routine Hem:    16-Sep-15 12:37, CBC Profile  WBC (CBC) 21.9  RBC (CBC) 2.28  Hemoglobin (CBC) 6.1  Hematocrit (CBC) 19.1  Platelet Count (CBC) 139  MCV 84  MCH 26.8  MCHC 32.0  RDW 15.4  Neutrophil % 94.1  Lymphocyte % 2.7  Monocyte % 2.8  Eosinophil % 0.0  Basophil % 0.4  Neutrophil # 20.6  Lymphocyte # 0.6  Monocyte # 0.6  Eosinophil # 0.0  Basophil # 0.1  Result(s) reported on 04 Aug 2014 at 01:07PM.  16-Sep-15 12:48, Sedimentation Rate  Erythrocyte Sed Rate 29  Result(s) reported on 04 Aug 2014 at 05:24PM.    17-Sep-15 05:08, CBC Profile  WBC (CBC) 21.0  RBC (CBC) 2.29  Hemoglobin (CBC) 6.1  Hematocrit (CBC) 19.4  Platelet Count (CBC) 109  MCV 84  MCH 26.5  MCHC 31.4  RDW 15.6  Neutrophil % 88.5  Lymphocyte % 5.0  Monocyte % 6.1  Eosinophil % 0.2  Basophil % 0.2  Neutrophil # 18.6  Lymphocyte # 1.1  Monocyte # 1.3  Eosinophil # 0.0  Basophil # 0.0  Result(s) reported on 05 Aug 2014 at 05:39AM.   RADIOLOGY:  Radiology Results: XRay:    04-Aug-15 16:23, Chest Portable Single View  Chest Portable Single View  REASON FOR EXAM:    fever, sob  COMMENTS:       PROCEDURE: DXR - DXR PORTABLE CHEST SINGLE VIEW  - Jun 22 2014  4:23PM     CLINICAL DATA:  Shortness of breath, fever, code sepsis    EXAM:  PORTABLE CHEST - 1 VIEW    COMPARISON:  01/29/2014; 01/22/2014    FINDINGS:  Examination is degraded due to patient body habitus and portable  technique.  Grossly unchanged enlarged cardiac silhouette and mediastinal  contours given persistently reduced lung volumes. Grossly unchanged  bibasilar heterogeneous opacities, left greater than right. No new  focal airspace opacities. Mild pulmonary venous congestion without  frank evidence of edema. No pleural effusion or pneumothorax.  Grossly unchanged bones.     IMPRESSION:  Bibasilar atelectasis without definite evidence of  pneumonia on this  hypoventilated examination. Further evaluation with a PA and lateral  chest radiograph may be obtained as clinically indicated.      Electronically Signed    By: Sandi Mariscal M.D.    On: 06/22/2014 16:27         Verified By: Aileen Fass, M.D.,    05-Aug-15 09:18, Chest PA and Lateral  Chest PA and Lateral  REASON FOR EXAM:    SOB  COMMENTS:       PROCEDURE: DXR - DXR CHEST PA (OR AP) AND LATERAL  - Jun 23 2014  9:18AM     CLINICAL DATA:  Shortness of breath    EXAM:  CHEST  2 VIEW    COMPARISON:  06/22/2014    FINDINGS:  Cardiac shadow is within normallimits. The lungs are clear. No  acute bony abnormality is noted.   IMPRESSION:  No acute abnormality seen.      Electronically Signed    By: Inez Catalina M.D.    On: 06/23/2014 09:26         Verified By: Everlene Farrier, M.D.,    12-Aug-15 12:41, Chest Portable Single View  Chest Portable Single View  REASON FOR EXAM:    check central line placement  COMMENTS:       PROCEDURE: DXR - DXR PORTABLE CHEST SINGLE VIEW  - Jun 30 2014 12:41PM     CLINICAL DATA:  Central line placement    EXAM:  PORTABLE CHEST - 1 VIEW    COMPARISON:  06/23/2014    FINDINGS:  Right internal jugular central line has its tip in the SVC 3 cm  above the right atrium. No pneumothorax. Cardiac silhouette remains  enlarged. There is mild basilar atelectasis.     IMPRESSION:  Right internal jugular central line tip in the SVC3 cm above the  right atrium. No pneumothorax.  Electronically Signed    By: Nelson Chimes M.D.    On: 06/30/2014 13:10         Verified By: Jules Schick, M.D.,    16-Sep-15 12:49, Chest Portable Single View  Chest Portable Single View  REASON FOR EXAM:    hypoxia, high wbc  COMMENTS:       PROCEDURE: DXR - DXR PORTABLE CHEST SINGLE VIEW  - Aug 04 2014 12:49PM     CLINICAL DATA:  Hypoxia and leukocytosis    EXAM:  PORTABLE CHEST - 1 VIEW    COMPARISON:  June 30, 2014    FINDINGS:  Central catheter tip is in the superior vena cava near the  cavoatrial junction. No pneumothorax. There is no edema or  consolidation. Heart is enlarged with pulmonary venous hypertension.  No adenopathy. No bone lesions.     IMPRESSION:  Evidence ofvolume overload. No frank edema or consolidation.  Central catheter as described without pneumothorax.      Electronically Signed    By: Lowella Grip M.D.    On: 08/04/2014 13:34         Verified By: Leafy Kindle. Jasmine December, M.D.,  [place]:    06-Aug-15 09:36, US Abdomen General Survey  US Abdomen General Survey  REASON FOR EXAM:    nausea, vomiting  COMMENTS:       PROCEDURE: [place]  - US ABDOMEN GENERAL SURVEY  - Jun 24 2014  9:36AM     CLINICAL DATA:  Nausea, vomiting    EXAM:  ULTRASOUND ABDOMEN COMPLETE    COMPARISON:  01/22/2014 renal ultrasound    FINDINGS:  Gallbladder:  No gallstones or wall thickening visualized. No sonographic Murphy  sign noted.    Common bile duct:    Diameter: Normal caliber, 3 mm    Liver:    Question mild fatty infiltration. No focal abnormality or biliary  ductal dilatation.    IVC:    No abnormality visualized.  Pancreas:    Visualized portion unremarkable.    Spleen:    Size and appearance within normal limits.    Right Kidney:    Length: 13.5 cm. Echogenicity within normal limits. No mass or  hydronephrosis visualized.    Left Kidney:  Length: 12.0 cm. Echogenicity within normal limits. No mass or  hydronephrosis visualized.    Abdominal aorta:    No aneurysm visualized.    Other findings:    None.     IMPRESSION:  Question mild fatty infiltration of the liver.    Electronically Signed    By: Rolm Baptise M.D.    On: 06/24/2014 10:10         Verified By: Raelyn Number, M.D.,  [place]:    04-Aug-15 15:57, Blood Culture  Ind. Clindamycin Resistance    04-Aug-15 16:04, Blood Culture  Ind. Clindamycin Resistance    04-Aug-15 16:23,  Chest Portable Single View  PACS Image    05-Aug-15 09:18, Chest PA and Lateral  PACS Image    05-Aug-15 14:10, Wound Aerobic/Anaerobic Culture  Ind. Clindamycin Resistance    06-Aug-15 09:36, US Abdomen General Survey  PACS Image    07-Aug-15 10:04, Blood Culture  Ind. Clindamycin Resistance    07-Aug-15 10:57, Blood Culture  Ind. 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  These are not immediately availabe.  Per his report, they showed no significant blackages in the arteries and evidence of old clots in the veins.  He seems to have reasonable arterial perfusion for wound healing clinically, but will try to get these studies to review.  Once I have the studies, can help determine if any other vascular testing is necessary.  Would try to avoid angiogram with his CR up so high.  If this does not improve, he may need HD.   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  Would use compression wraps/UNNA  boots to decrease the swelling and help with the wounds.  A VAC may be a reasonable option as well and leg elevation would be recommended. Will follow    level 4   Electronic Signatures: Algernon Huxley (MD)  (Signed 17-Sep-15 14:46)  Authored: Chief Complaint and History, PAST MEDICAL/SURGICAL HISTORY, ALLERGIES, HOME MEDICATIONS, Family and Social History, Review of Systems, Physical Exam, LABS, RADIOLOGY, Assessment and Plan   Last Updated: 17-Sep-15 14:46 by Algernon Huxley (MD)

## 2015-03-12 NOTE — Consult Note (Signed)
PATIENT NAME:  Angel Mays, Angel Mays MR#:  [id number] DATE OF BIRTH:  [birth year]  DATE OF CONSULTATION:  08/06/2014  REFERRING PHYSICIAN:  Ramonita Lab, MD CONSULTING PHYSICIAN:  Barnetta Chapel, MD / Hardie Shackleton. Collis Thede, PA-C  REASON FOR CONSULTATION: Anemia.   HISTORY OF PRESENT ILLNESS: This is a pleasant 44 year old gentleman who has been an inpatient since August 04, 2014 with bacteremia and sepsis with blood cultures positive for gram-positive cocci. Infectious disease has been on board and he is currently receiving daptomycin. He actually was treated with vancomycin for 1 month and finished this therapy on September 8th of 2015 shortly after he began feeling sick again and was later admitted with sepsis and leukocytosis. His white blood cells are currently 22.1. His hemoglobin was 6.1 at admission and is 6.7 today. His hemoglobin has not gotten above 7 at all over the past 3 days despite getting transfused 2 units of packed red blood cells. Per the hospitalist's note, he was heme-positive, but according to the chart it looks like the fecal occult blood test was actually negative. The patient denies any signs of overt GI bleeding. He is moving his bowels regularly and denies any diarrhea, constipation, bright red blood per rectum or melena. He is denying any abdominal pain. His only symptomatic complaint is nausea and the lack of appetite. He is also complaining of some fatigue. No actual vomiting. The patient has not seen any overt bleeding. He denies any prior EGD or colonoscopy. He denies any family history of GI malignancy or colon polyps that he is aware of, although he does admit that his family history knowledge is limited. C. diff was negative and this was tested due to some looser stools at admission. He has been in acute on chronic kidney failure throughout hospitalization and renal is on board as well. Iron studies suggest anemia of chronic disease. Cardiology is also on board and is considering a TEE  to look for signs of vegetation. No fever or chills. No chest pain or shortness of breath.   PAST MEDICAL HISTORY: Diabetes mellitus, hypertension, depression, cataracts, obesity, history of DVT, chronic lower extremity ulcers, history of bacteremia and cellulitis.   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   FAMILY HISTORY: The patient denies any known family history of GI malignancy, colon polyps or IBD, although he does admit that the knowledge of his family history is limited.   SOCIAL HISTORY: The patient denies any alcohol, tobacco or illicit drug use. He is disabled.   ALLERGIES: CONTRAST DYE.   REVIEW OF SYSTEMS: Ten system review of systems was obtained on the patient. Pertinent positives are mentioned above and otherwise negative.   PHYSICAL EXAMINATION:  VITAL SIGNS: Blood pressure 111/48, heart rate 97, respirations 19, temperature 98.3, bedside pulse ox is 94%, currently with a nasal cannula.  GENERAL: This is a pleasant 43 year old gentleman resting quietly and comfortably in bed in no acute distress, alert and oriented x3.  HEAD: Atraumatic, normocephalic.  NECK: Supple. No lymphadenopathy noted.  HEENT: Sclerae anicteric. Mucous membranes moist.  LUNGS: Respirations are even and unlabored. Clear to auscultation in bilateral anterior lung fields.  CARDIAC: Regular rate and rhythm. S1, S2 noted.  ABDOMEN: Soft. Very mildly tender in the epigastric region without guarding or rebound. No masses, hernias or organomegaly appreciated, except exam is extremely limited secondary to obese habitus.  RECTAL: Deferred. Fecal occult blood test was negative.  EXTREMITIES: Left below the knee amputation is noted. Obesity is noted. Some skin discoloration is also noted in his lower extremities. 2+ pulses noted in bilateral upper  extremities.  PSYCHIATRIC: Appropriate mood and affect.   DIAGNOSTIC DATA: White blood cells 22.1, hemoglobin 6.7, hematocrit 20.5, platelets 110,000. Sodium 138, potassium 4.5, BUN 108, creatinine 6.14, glucose 159. Fecal occult blood test negative. MCV 84. C. diff negative. Blood cultures are positive for gram-positive cocci. Ferritin 1564. TIBC 119. Serum iron is 14. Iron saturation percentage 12.   ASSESSMENT: 1.  Bacteremia and sepsis with methicillin-resistant Staphylococcus aureus. Blood cultures are positive for gram-positive cocci. Infectious disease is on board.  2.  Leukocytosis.  3.  Acute on chronic kidney disease.  4.  Normocytic anemia.  5.  Nausea.   PLAN: I have discussed this patient's case in detail with Dr. Barnetta ChapelMartin Skulskie who is involved in the development of the patient's plan of care. Based on the patient's iron studies and his hemoglobin has not gotten above 7 and is normocytic, it is likely secondary to acute kidney disease. He is heme negative on exam. We do feel that his current clinical state would make him high risk for a sedated procedure at this point. Specifically, we would not pursue a sedated luminal evaluation until cardiac workup for sepsis is completed. We will continue to monitor this patient closely throughout hospitalization and do recommend checking serial labs and being prepared to transfuse as necessary. Dr. Barnetta ChapelMartin Skulskie will discuss further with the patient later on this afternoon. All questions were answered.   Thank you so much for this consultation and for allowing us to participate in the patient's plan of care.   This services provided by Hardie ShackletonKaryn M. Easter Kennebrew, PA-C under collaborative agreement with Dr. Barnetta ChapelMartin Skulskie. ____________________________ Hardie ShackletonKaryn M. Clytee Heinrich, PA-C kme:sb D: 08/06/2014 14:29:54 ET T: 08/06/2014 15:10:24 ET JOB#: [id number]  cc: Hardie ShackletonKaryn M. Elvy Mclarty, PA-C, <Dictator> Hardie ShackletonKARYN M Jamie Belger PA ELECTRONICALLY SIGNED 08/06/2014 15:57

## 2015-03-12 NOTE — Discharge Summary (Signed)
PATIENT NAME:  Angel GuanSCOTT, Jayvian MR#:  [id number] DATE OF BIRTH:  Dec 21, 1970  DATE OF ADMISSION:  06/22/2014 DATE OF DISCHARGE:  06/30/2014  Please refer to the already-dictated interim summary of 06/29/2014.  This will cover from 06/29/2014 thru 06/30/2014.   ADMISSION DIAGNOSIS: Sepsis.  DISCHARGE DIAGNOSES:  1.  Sepsis on admission secondary to methicillin-resistant Staphylococcus aureus, likely source is  the right leg ulcer and cellulitis.  2.  Right lower extremity cellulitis and ulcer present on admission.  3.  Acute renal failure on chronic kidney disease stage III.  4.  Hypertension.  5.  Anemia of chronic disease.  6.  History of deep venous thrombosis, on Eliquis.  7.  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  8.  Diabetes.  9.  Depression.   HOSPITAL COURSE: This is a 44 year old male with a history of diabetes status post left leg below-the-knee amputation, hypertension, obesity, chronic ulcers, who was brought from [facility] secondary to hypotension and fevers, found to have sepsis. For further details, please refer to history and failure  1.  Sepsis. The patient presented with sepsis; he was hypotensive, febrile with elevated white blood cell count. His blood cultures did come back positive for MRSA. His initial blood cultures and repeat blood cultures 2 days later also grew MRSA. His most recent blood cultures have been negative. ID had been consulted. He was initially placed on Zosyn, Zyvox, and Levaquin. Due to the fact that the patient's blood cultures grew MRSA he was changed to vancomycin. An echocardiogram was performed which did not show evidence of vegetation; however, due to persistent bacteremia Dr. Sampson GoonFitzgerald, ID specialist, recommended 4 weeks of antibiotics for possible endocarditis. Surgery and podiatry were consultation for his wounds. They are chronic, well-healing superficial ulcers. No further treatment was recommended.  2.   Acute renal failure, chronic kidney disease. The patient's creatinine has much improved. I suspect he has chronic kidney disease stage III. And will need outpatient followup.  3.  Hypertension. His blood pressure has improved. He is back on his outpatient medications with the exception of hydralazine due to his normal blood pressures on his current medications. If he remains hypertensive as an outpatient then he will need to be back on hydralazine.  4.  History of deep venous thrombosis. The patient follows with Dr. Sherrlyn HockPandit. He is on Eliquis for maintenance medication.  5.  Anemia of chronic disease and iron deficiency. Hemoglobin is around 8 and has been stable.   DISCHARGE MEDICATIONS:  1.  Pentoxifylline 400 mg t.i.d.  2.  Pepcid 20 mg b.i.d.  3.  Clonidine 0.2 mg b.i.d.  4.  Nephro-Vite 1 tablet daily.  5.  Eliquis 5 mg b.i.d.  6.  Coreg 25 mg b.i.d.  7.  Norvasc 10 mg daily.  8.  Tylenol 325 mg 2 tablets q. 4 hours p.r.n. pain or fever. 9.  Ferrous sulfate 325 mg daily.  10. Levemir 15 units b.i.d.  11. Loratadine 10 mg at bedtime.  12. Reglan 5 mg t.i.d.  13.  Risa-Bid 1 tablet b.i.d.  14.  Senna 1 tablet b.i.d.  15. Zoloft 100 mg 1.5 tablets at bedtime.  16. Vitamin D3, 5000 international units daily in the afternoon except on Saturday and Sunday 17. Zyloprim 100 mg daily.  18. OxyContin 10 mg p.o. t.i.d.  19. Remeron 30 mg at bedtime.  20. Santyl to affected area daily.  21. Vancomycin 1500 mg every 6 hours, stop after 4 weeks on  07/27/2014. Pharmacy to dose.   DISCHARGE DRESSING: Keep dressing dry, cleanse right leg with normal saline and pat gently dry. Apply SensiCare #3 Barrier cream to right lower leg, apply Santyl ointment to open lesions on the right dorsal foot and right lateral calf  at 1/8th-inch thickness, top with normal-saline-moistened dressing, top with 4 x 4 and Kerlix, secure with tape, apply CircAid compression garment, change daily, float calf on pillow for heel  pressure.   DISCHARGE DIET: Low-fat ADA diet.   DISCHARGE SUPPLEMENT: Glucerna t.i.d.   DISCHARGE FOLLOWUP:  Patient will follow up in 1 week with Dr. Sampson Goon, in 2 weeks with Dr. Maryellen Pile. Please check weekly vancomycin levels, BNP and CBC and fax results to Dr. Sampson Goon.  Pharmacy to dose vancomycin.   The patient will follow up in 1 week with Dr. Ether Griffins.    The patient had a central line placed instead of a PICC due to the fact that he has kidney disease and may need future graft placement. He will need routine care to his central line.   TIME SPENT: 40 minutes.   ____________________________ Janyth Contes. Juliene Pina, MD spm:lt D: 06/30/2014 11:39:06 ET T: 06/30/2014 11:58:49 ET JOB#: [id number]  cc: Stephanieann Popescu P. Juliene Pina, MD, <Dictator> Dr. Lorelle Gibbs P. Sampson Goon, MD Serita Sheller Maryellen Pile, MD Janyth Contes Nautica Hotz MD ELECTRONICALLY SIGNED 06/30/2014 13:09

## 2015-03-12 NOTE — Discharge Summary (Signed)
PATIENT NAME:  Angel GuanSCOTT, Wynston MR#:  [id number] DATE OF BIRTH:  [birth year]  DATE OF ADMISSION:  08/04/2014 DATE OF DISCHARGE:  08/11/2014  PRIMARY CARE PHYSICIAN:  At [facility] and Dr. Sampson GoonFitzgerald infectious disease.   infectious disease, Dr. Sampson GoonFitzgerald and also to [facility].   FINAL DIAGNOSES:  1. 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  2. Acute renal failure on chronic kidney disease, patient currently on dialysis.  3. Acute encephalopathy, could be uremia and sepsis.  4. Acute anemia with positive occult blood status post 5 units of packed red blood cells on this hospital course.  5. Elevated troponin likely secondary from sepsis.  6. Diabetes.  7. Hypertension.  8. Chronic lower extremity venous ulcer with right lower extremity cellulitis.  9. Abdominal pain.   CURRENT MEDICATIONS: Include Tylenol 650 mg orally q. 4 hours for mild pain, ammonium lactate 12% applied to right lower extremity twice a day, Abilify 5 mg daily, collagenase ointment applied to right lower extremity wound daily, daptomycin 1000 mg IV piggyback q. 48 hours, Senokot-S 1 tablet b.i.d., NovoLog sliding scale, lactobacillus 1 capsule twice a day, Remeron 30 mg at bedtime, multivitamin 1 tablet daily, Zofran 4 mg IV push q. 4 hours for nausea and vomiting, OxyContin ER 10 mg b.i.d., Zoloft 150 mg daily, Zyvox 600 mg IV q. 12 hours, allopurinol 100 mg every other day, Coreg 12.5 mg b.i.d., Protonix 40 mg daily.   HOSPITAL COURSE: The patient was admitted 08/04/2014. This is a 44 year old man who is being treated for MRSA of the blood for suspected endocarditis and finished a 4 week treatment of vancomycin on September 8 as per Dr. Sampson GoonFitzgerald, infectious disease. Routine blood work after treatment showed elevated creatinine and elevated white blood cell count and worsening anemia. The patient still had a catheter in his  right neck at the time.  He was on 3 liters of oxygen, had a pulse oximetry 93%. Creatinine of 5.71. Hemoglobin of 6.1, white blood cell count of 21.9 on admission. He was admitted with acute renal failure on chronic kidney disease and he was given IV fluid hydration, persistent sepsis with MRSA Staphylococcus. Blood cultures were sent off x 2. Dr. Sampson GoonFitzgerald recommended switching antibiotics to daptomycin. The patient did receive 1 dose of vancomycin in the ER. A stool for Clostridium difficile was sent off also. For his acute respiratory failure he was given oxygen supplementation. For his anemia he was started off with 1 unit of packed red blood cells over 4 hours. For his diabetes he was put on sliding scale.   LABORATORY AND RADIOLOGICAL DATA DURING THE HOSPITAL COURSE: Included an EKG normal sinus rhythm, no acute ST-T wave changes. Ferritin 1564, TIBC 119, iron serum 14, iron saturation 12%. Troponin borderline at 0.89. CPK 31. Glucose 167, BUN 87, creatinine 5.71, sodium 139, potassium 4.4, chloride 110, CO2 17, calcium 8.0. Liver function tests, total bilirubin 1.3, albumin low at 2.1. White blood cell count 21.9, hemoglobin and hematocrit 6.1 and 19.1, platelet count of 139,000. Blood cultures on the 16th grew out MRSA. Sedimentation rate 29. Chest x-ray, evidence of volume overload, no frank edema or consolidation. Next troponin up at 1.1, next troponin up at 1.3. Urine culture only 5000 gram-negative rods. Urinalysis, trace leukocyte esterase. Occult blood was negative initially. Stool for Clostridium difficile negative. LDL 28, HDL 5, triglycerides 170. Haptoglobin 128. Erythropoietin 24.2 Blood culture on the 18th positive for MRSA. Blood culture on the 20th  positive for MRSA. Creatinine on the 21st 7.16. Hemoglobin 6.5. Abdomen flat and erect on the 21st showed no evidence of obstruction. Direct Coombs test negative. Protein electrophoresis still pending. Vitamin B12 949, folic acid 27.7. Last white  blood cell count 22,000, hemoglobin 7, hematocrit 22.3, platelet count 160,000. Glucose 206, BUN 101, creatinine 6.24, sodium 137, potassium 4.8, chloride 106, CO2 of 22, calcium 8.0.   CONSULTANTS DURING THE HOSPITAL COURSE: Included Dr. Sampson Goon infectious disease, Dr. Wyn Quaker vascular, Dr. Thedore Mins in nephrology, Dr. Marva Panda gastroenterology, Dr. Kirke Corin and Dr. Mariah Milling cardiology, Dr. Sherrlyn Hock hematology.   HOSPITAL COURSE PER PROBLEM LIST:  1.  For the persistent sepsis with MRSA, the patient has intermittent fevers, leukocytosis, and tachycardia. Blood culture is still positive. White blood cell count is still high. The patient's right neck catheter was removed. The patient is on daptomycin and Zyvox as per infectious disease. On 08/11/2014 a TEE was done which showed a large vegetation noted on the aortic valve likely multi-leaflet vegetation estimated 1 cm in size, 2 or more seen, with moderate to severe aortic regurgitation, normal EF, suspected small PFO. Because of these results, I do not have a cardiothoracic surgeon here at this hospital, need to transfer to a tertiary care center.  Dr. Radonna Ricker at [facility] did accept the patient medically and we will consult all of the specialists once there. The source of the initial endocarditis could be that right lower extremity, an MRI is to be done of that lower extremity today at 4:00 if he is still here, if he is not here that can be done over at [facility].  2.  For the acute renal failure on chronic kidney disease, creatinine did not improve despite IV fluid hydration. The patient had a dialysis catheter placed on the 20th, dialysis was done on the 21st and 22nd. Further dialysis as per nephrology. Likely the acute renal failure is secondary to ATN from persistent sepsis. The patient does have underlying chronic kidney disease.  3.  For the patient's acute encephalopathy, could be uremia, could be sepsis. The patient does answer questions but very simple with  his answers.  4.  Acute anemia, positive occult blood. The patient has received 5 units of blood while here. I did stop the patient's Eliquis.  GI did not want to do any procedures at this time secondary to the persistent sepsis, supportive care given. I did consult Dr. Sherrlyn Hock, hematology.  The patient does have anemia of chronic disease, agreed with ongoing transfusion support.  5.  Elevated troponin, likely secondary to sepsis. Seen in consultation by cardiology.  6.  Diabetes. I put him on sliding scale, did not want to drop his sugars too low, with the acute renal failure the insulin may stick around longer.  7.  Hypertension. All medications were held except for the patient's Coreg. Blood pressure is currently stable.  8.  Chronic lower extremity venous ulcers with right lower extremity cellulitis. We did order an MRI to rule out abscess formation, that will be done at 4:00 today on September 23 if still here at [facility], if he is over at [facility] can do that there.    TIME SPENT ON PATIENT CARE TODA AND COORDINATION OF CARE: 60 minutes.    ____________________________ Herschell Dimes. Renae Gloss, MD rjw:bu D: 08/11/2014 14:45:00 ET T: 08/11/2014 15:09:28 ET JOB#: [id number]  cc: Herschell Dimes. Renae Gloss, MD, <Dictator> Stann Mainland. Sampson Goon, MD [facility] Scarlet MD ELECTRONICALLY SIGNED 08/12/2014 14:04

## 2015-03-12 NOTE — H&P (Signed)
PATIENT NAME:  Angel Mays, Angel Mays MR#:  [id number] DATE OF BIRTH:  December 21, 1970  DATE OF ADMISSION:  08/04/2014  PRIMARY CARE PHYSICIAN: Designer, television/film set, also sees Dr. Sampson Goon of infectious disease.   CHIEF COMPLAINT: Not feeling well.   HISTORY OF PRESENT ILLNESS: This is a 44 year old man who was being treated for MRSA in the blood, suspected endocarditis, finished treatment of vancomycin on September 08 as per Dr. Sampson Goon. Ended up seeing him yesterday in the office. Blood work was done today and sent in for elevated creatinine and elevated white count and worsening anemia. The patient still has a catheter in his right neck. He complains of nausea, poor appetite, headache, weakness, dry mouth, shortness of breath, coughing up yellow phlegm. In the ER, he was found to be afebrile, pulse oximetry of 93% on 3 liters. White count was elevated at 21.9, hemoglobin 6.1, creatinine 5.71. Hospitalist services were contacted for further admission.   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   PAST SURGICAL HISTORY: Abscess on the hand and abdomen, rotator cuff surgery x 2, amputation of the left leg in 2013, cataract surgery.   ALLERGIES: CONTRAST DYE OMNIPAQUE 140.   FAMILY HISTORY: The patient does not know about his parent's health,   SOCIAL HISTORY: Currently at [employer]. No smoking. No alcohol. No drug use. Is on disability.   REVIEW OF SYSTEMS: CONSTITUTIONAL: Positive for fatigue. No fever, chills, or sweats. No weight loss. No weight gain.  EYES: No blurry vision.  EARS, NOSE, MOUTH AND THROAT: Positive for sore throat. No hearing loss. No difficulty swallowing.  CARDIOVASCULAR: No chest pain. No palpitations.  RESPIRATORY: Positive for shortness of breath. Positive for cough, yellow phlegm.  GASTROINTESTINAL: Positive  for nausea. Positive for vomiting. No abdominal pain. Positive for diarrhea. No bright red blood per rectum. No melena.  GENITOURINARY: No burning on urination or hematuria.  MUSCULOSKELETAL: Positive for joint pain.  INTEGUMENT: No rashes or eruptions.  NEUROLOGIC: No fainting or blackouts.  PSYCHIATRIC: On medication for anxiety.  ENDOCRINE: No thyroid problems.  HEMATOLOGIC AND LYMPHATIC: Positive for anemia.   PHYSICAL EXAMINATION: VITAL SIGNS: Pulse oximetry 93% on 3 liters. Temperature 97.9, pulse 97 to 110, respirations 22, blood pressure 119/46, pulse oximetry 93% on 3 liters.  GENERAL: No respiratory distress.  EYES: Conjunctivae and lids normal. Pupils equal, round, and reactive to light. Extraocular muscles intact. No nystagmus.  EARS, NOSE, MOUTH AND MOUTH AND THROAT: Tympanic membranes, no erythema. Nasal mucosa, no erythema. Throat, no erythema, no exudate seen. Lips and gums, no lesions.  NECK: No JVD. No bruits. No lymphadenopathy. No thyromegaly. No thyroid nodules palpated.  RESPIRATORY: Lungs clear to auscultation. No use of accessory muscles to breathe. No rhonchi, rales, or wheeze heard.  CARDIOVASCULAR SYSTEM: S1, S2 soft. No gallops, rubs, or murmurs. Carotid upstroke 2+ bilaterally. No bruits. Dorsalis pedis pulses difficult to palpate secondary to 4+ edema of the lower extremity.  ABDOMEN: Soft, nontender. No organomegaly/splenomegaly. Normoactive bowel sounds. No masses felt.  LYMPHATIC: No lymph nodes in the neck.  MUSCULOSKELETAL: With 4+ edema. No clubbing. No cyanosis.  SKIN: Chronic lower extremity discoloration and scaling. Wound on the right lower extremity seems to be nonhealing.  PSYCHIATRIC: The patient is alert, oriented to person, place, and time.  NEUROLOGIC: Cranial nerves II through XII grossly intact.   LABORATORY AND RADIOLOGICAL DATA: Chest x-ray, evidence of  volume overload. No frank edema or consolidation. Central catheter without pneumothorax.  White blood cell count 21.9, hemoglobin and hematocrit 6.1 and 19.1, platelet count 139,000. Glucose 167, BUN 87, creatinine 5.71, sodium 139, potassium 4.4, chloride 110, CO2 of 17, calcium 8.0, total bilirubin 1.3, ALT 14, AST 27, albumin low at 2.1. Troponin borderline at 0.89. EKG: Normal sinus rhythm, 97 beats per minute.   ASSESSMENT AND PLAN: 1.  Acute renal failure on chronic kidney disease. We will give gentle IV fluids, hold while giving blood. Continue to monitor creatinine on a daily basis. The patient had a renal sonogram previously.  2.  Likely persistent sepsis with methicillin-resistant Staphylococcus aureus with leukocytosis and tachycardia. Will get blood cultures x 2. I spoke with Dr. Sampson GoonFitzgerald, infectious disease. He recommended changing antibiotics to daptomycin. The patient did receive 1 dose of vancomycin in the ER. The patient finished a full course of 4 week treatment on September 8 of the vancomycin. The patient also having some diarrhea. I will send off a stool for Clostridium difficile just in case this could be a cause of why he is not feeling well.  3.  Acute respiratory failure. Will give oxygen supplementation. The patient unable to maintain saturations without oxygen supplementation.  4.  Anemia, acute on chronic. Will send off iron studies. The patient is on ferrous sulfate. We will transfuse 1 unit of packed red blood cells over 4 hours.  5.  Diabetes. We will put on sliding scale for right now, hold the Levemir  6.  Hypertension. Continue usual medications. Watch blood pressure closely.  7.  Chronic lower extremity wounds. Will get a wound care referral. May end up needing vascular surgery referral to assess blood flow, but will hod off on that with the acute renal failure.  8.  Elevated troponin, likely sepsis related. We will get serial cardiac enzymes, put on off unit telemetry.  9.  Will check a sedimentation rate.   TIME SPENT ON ADMISSION: 60 minutes.     ____________________________ Herschell Dimesichard J. Renae GlossWieting, MD rjw:at D: 08/04/2014 16:31:01 ET T: 08/04/2014 18:08:46 ET JOB#: [id number]  cc: Herschell Dimesichard J. Renae GlossWieting, MD, <Dictator> Stann Mainlandavid P. Sampson GoonFitzgerald, MD Salley ScarletICHARD J Joshiah Traynham MD ELECTRONICALLY SIGNED 08/12/2014 14:03

## 2015-03-12 NOTE — Consult Note (Signed)
Chief Complaint:  Subjective/Chief Complaint seen for anemia.  patietn states he has a generalized disvomfort, no abdominalpain, no n/v. no emesis or bm. poor appetite, declining food on regular diet, drinking some ensure.   VITAL SIGNS/ANCILLARY NOTES: **Vital Signs.:   20-Sep-15 11:15  Vital Signs Type 15 min Post Blood Start Time  Temperature Temperature (F) 98.2  Celsius 36.7  Pulse Pulse 89  Respirations Respirations 16  Systolic BP Systolic BP 993  Diastolic BP (mmHg) Diastolic BP (mmHg) 69  Mean BP 83  Pulse Ox % Pulse Ox % 95  Oxygen Delivery 2L; Nasal Cannula   Brief Assessment:  GEN obese   Cardiac Regular   Respiratory clear BS   Gastrointestinal details normal Soft  Nontender  Nondistended  Bowel sounds normal   Lab Results: Routine BB:  20-Sep-15 07:59   Crossmatch Unit 1 Ready  Crossmatch Unit 2 Issued (Result(s) reported on 08 Aug 2014 at 10:56AM.)  ABO Group + Rh Type AB Positive  Antibody Screen NEGATIVE (Result(s) reported on 08 Aug 2014 at 10:00AM.)  Routine Chem:  16-Sep-15 12:37   Creatinine (comp)  5.71  17-Sep-15 05:08   Creatinine (comp)  5.82  18-Sep-15 06:15   Creatinine (comp)  6.14  19-Sep-15 04:52   Creatinine (comp)  6.34  20-Sep-15 04:53   Glucose, Serum  197  BUN  130  Creatinine (comp)  7.02  Sodium, Serum 136  Potassium, Serum 5.1  Chloride, Serum  109  CO2, Serum  17  Calcium (Total), Serum  8.1  Anion Gap 10  Osmolality (calc) 319  eGFR (African American)  10  eGFR (Non-African American)  9 (eGFR values <25m/min/1.73 m2 may be an indication of chronic kidney disease (CKD). Calculated eGFR is useful in patients with stable renal function. The eGFR calculation will not be reliable in acutely ill patients when serum creatinine is changing rapidly. It is not useful in  patients on dialysis. 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  Heme negative, no evidence of significant GI bleeding.  Patietn 4 liters positive since admission per weight.  Increasing evidence of renal insufficiency.  Declining condition due to poor po intake.   Plan 1) continue ppi for prophylaxis, will change to iv due to absorbtion considerations. No plans for sedated luminal evaluation. Discussed with Dr WEarleen Newport following.   Electronic Signatures: SLoistine Simas(MD)  (Signed 20-Sep-15 13:31)  Authored: Chief Complaint, VITAL SIGNS/ANCILLARY NOTES, Brief Assessment, Lab Results, Assessment/Plan   Last Updated: 20-Sep-15 13:31 by SLoistine Simas(MD)

## 2015-03-12 NOTE — H&P (Signed)
PATIENT NAME:  Angel Mays, Clever MR#:  [id number] DATE OF BIRTH:  Aug 23, 1971  DATE OF ADMISSION:  06/22/2014  PRIMARY CARE PHYSICIAN: At [facility].  CHIEF COMPLAINT: Sepsis and hypotension.   HISTORY OF PRESENTING ILLNESS: A 44 year old male with history of diabetes, hypertension, obesity, amputation of the left leg because of infection and currently having chronic ulcers on the right leg with worsening and wheelchair-bound. Lives in rehabilitation.  For the last 2-3 days, he had fever, headache, nausea, chills and vomiting, so at rehabilitation they started him on antibiotic thinking he has  pneumonia, started on Levaquin 750 oral daily. The patient to get for the last 2 days, but continued to feel the same so, finally, they sent him today to Emergency Room. In the ER, on arrival, he was found having low blood pressure and fever 102 degrees Fahrenheit, so started on septic protocol, given broad-spectrum antibiotics and IV fluid boluses, and after receiving 5 liters of IV fluid, as he is morbidly obese, his blood pressure came up to 100/60. The patient is totally alert and oriented.  On the workup in ER, chest x-ray shows some questionable congestion or atelectasis or infiltrate, which may be the source of infection or he has a bad ulcer on the side of the right leg which looks like infected.  It may be the source of his sepsis and given to hospitalist team for further management.   REVIEW OF SYSTEMS: CONSTITUTIONAL: Positive for fever, fatigue, chills.  No weight loss or weight gain.  EYES:  No blurring, double vision, discharge or redness.  EARS, NOSE, THROAT: No tinnitus, ear pain or hearing loss.  RESPIRATORY: No wheezing, hemoptysis or shortness of breath.  CARDIOVASCULAR: No chest pain, orthopnea, edema, arrhythmia, or palpitation.  GASTROINTESTINAL: The patient had nausea, vomiting and no diarrhea or abdominal pain.  GENITOURINARY: No dysuria, hematuria, or urinary  frequency.  ENDOCRINE: No heat or cold intolerance. No excessive sweating.  SKIN: No acne, but has chronic rashes on the right lower leg  NEUROLOGICAL: No numbness, weakness, tremor, or vertigo.  PSYCHIATRIC: No anxiety, insomnia, has complaint of depression chronically.     PAST MEDICAL HISTORY:   1.  Diabetes. 2.  Hypertension.  3.  Chronic ulcers on the legs.  4.  Cataract.  5.  Depression.  6.  Questionable history of DVT. 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  7.  Infection in the left leg and below-knee above-knee amputation in 2013.  8.  Cataract surgery.   FAMILY HISTORY: He was a smoker in the past, quit 14 years ago. Denies drinking alcohol or doing any drugs other than he was prescribed medications in the nursing home.   FAMILY HISTORY: Negative for coronary artery disease or diabetes.   HOME MEDICATIONS: 1.  Zyloprim 100 mg oral once a day.  2.  Vitamin D3, 5000 international units once a day.  3.  Trazodone 50 mg oral once a day. 4.  Sevelamer 2 tablets oral 3 times a day.  5.  Sertraline 100 mg oral once a day.  6.  Senna plus one tablet 2 times a day. 7.  Scopolamine 1.5 mg transdermal patch.  8.  Promethazine 25 mg oral every 6 hours as needed for nausea and vomiting.  9.  Pepcid 20 mg oral 2 times a day.  10.  Pentoxifylline 400 mg oral extended-release 3 times a day. 11.  OxyContin 10 mg oral  2 times a day.  12.  Norvasc 10 mg oral once a day.  13.  Nephro-Vite once a day.  14.  Metoclopramide 5 mg oral 3 times a day.  15.  Loperamide 10 mg oral once a day.  16.  Flonase 2 sprays once a day.  17.  Levaquin 750 mg oral once a day for 10 days.  18.  Hydralazine 100 mg oral 3 times a day.  19.  Ferrous sulfate 325 mg oral once a day.  20.  Eliquis 5 mg oral. 21.  Coreg 25 mg 2 times a day. 22.  Clonidine 0.2 mg oral 2 times a day.  23.  Acetaminophen 325 mg oral 2 tablets oral every 4 hours as needed  for fever.   PHYSICAL EXAMINATION: VITAL SIGNS: In the ER, as per the records in the ER. Initially, temperature was 103.3 and heart rate was 115.  Blood pressure was 70/40. Oxygen saturation 94% on room air.  GENERAL: The patient is morbidly obese and appears in slight distress because of his sickness, morbidly obese.  HEENT: Head and neck atraumatic.  Conjunctivae pink. Oral mucosa moist.  NECK: Supple. No JVD.  RESPIRATORY: Bilateral equal and clear air entry.  CARDIOVASCULAR: S1, S2 present, regular. No murmur.  ABDOMEN: Soft, nontender.  Bowel sounds are present.  No organomegaly.  SKIN: No acne on right leg. On the foot, there are chronic changes of thickening of the skin and  ulcers and chronic infections.  On the side of the right leg, there is an ulcer with slough at the base and somewhat surrounding redness, looks infected.  Left below-knee amputation present. Joints: No swelling or tenderness.  NEUROLOGICAL: Power 4/5 in all limbs. Generalized weakness.  PSYCHIATRIC: Does not appear in any acute psychiatric illness at this time.   IMPORTANT LABORATORY RESULTS: Chest x-ray, portable, is done and showed bibasilar atelectasis without definite evidence of pneumonia on this hypoventilation examination.  Glucose 154, BUN 54, creatinine 4.3, sodium 136, potassium 4.3, chloride 102, CO2 23. Calcium is 8.6, phosphorus 2.0, magnesium 1.  Total protein 7.3, albumin 2.7, bilirubin 1.1, alkaline phosphatase 54, SGOT 17, SGPT 19. Troponin 0.07. WBC 11.4, hemoglobin 10.4, platelet count is 143,000. MCV is 87. INR is 1.9. Prothrombin time 21.0.   Urinalysis is amber, hazy, 2 WBCs, negative leukocyte esterase and negative nitrite, lactic acid 1.4.   ASSESSMENT AND PLAN: A 44 year old male with multiple medical issues and below-knee amputation on the left side with chronic wound and infections on the right leg, came to hospital hypotensive and septic. After receiving IV fluid boluses, blood pressure is  more stable now.  1.  Sepsis, most likely it is secondary to right leg cellulitis and ulcers which look infected. There might be a component of pneumonia, but currently chest x-ray does not reported, though it is on the ventilated films, so will have to get chest x-ray, PA and lateral tomorrow morning. Meanwhile, blood pressure responded to IV fluid and heart rate normalized, so will continue monitoring on telemetry and continue IV fluids. The patient is very high risk to undergo for respiratory and cardiac failure because of sepsis. 2.  Cellulitis and possible pneumonia. We will treat with broad-spectrum antibiotic because of renal failure. Will use Zosyn, Levaquin and get linezolid.  Blood cultures have been sent by ER. 3.  Acute on chronic renal failure. The patient has chronic kidney disease stage III according to the nursing home records.  Creatinine is 4.3 and his GFR is 16.  So, there is an acute component in it. Will continue monitoring and give some IV fluid or nephrotoxic drugs and will get nephrology consult if his kidney function does not improve.  4.  Hypomagnesemia. Replace IV.  5.  Hypophosphatemia. We will give Neutra-Phos. 6. Questionable history of deep vein thrombosis. The patient states that it was more for prophylaxis.  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 Currently because of renal failure stage V, will hold his Eliquis and just put him on heparin subcutaneous coverage.  7.  Diabetes. We will put on insulin sliding scale coverage.  8. Hypertension. Hold all the medications at this time because of sepsis and continue monitoring.  9.  Elevated troponin 0.07. This is likely sepsis. No interventions at this time:   CODE STATUS:  Full code. The patient's healthcare power of attorney is his wife.   TOTAL TIME SPENT ON THIS ADMISSION: Critical care, 60 minutes.    ____________________________ Hope Pigeon  Elisabeth Pigeon, MD vgv:ds D: 06/22/2014 20:58:54 ET T: 06/22/2014 21:19:30 ET JOB#: [id number]  cc: Hope Pigeon. Elisabeth Pigeon, MD, <Dictator> Altamese Dilling MD ELECTRONICALLY SIGNED 06/28/2014 8:24

## 2015-03-12 NOTE — Consult Note (Signed)
General Aspect Primary Cardiologist:  New - seen by M. Fletcher Anon, MD  _____________  44 y/o male w/o prior cardiac hx who was admitted with recurrent bacteremia and we've been asked to eval 2/2 concern for endocarditis. _____________  Past Medical History ??? Normocytic anemia  ??? Diabetes mellitus with complication  ??? GERD (gastroesophageal reflux disease)  ??? Hypertension  ??? Venous stasis ulcers    a. 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                             b. RLE with recurrent cellulitis. ??? Morbid obesity  ??? CKD (chronic kidney disease), stage III  ??? Gout  ??? Sepsis    a. 06/2014 MRSA Sepsis->4 wks of Vanc;  b. 06/2014 Echo: EF 55-60%, mild conc LVH, mod dil LA/RA, mild Ao sclerosis w/o stenosis, no veg/mass. ??? Peripheral neuropathy  ??? Depression  ??? Cataract  ??? Seasonal allergies  ??? Abdominal abscess    a. s/p prior drainage. ??? DVT (deep venous thrombosis)    a. On Eliquis, s/p IVC filter.  Past Surgical History ??? Below knee leg amputation Left    2013 ??? Right rotator cuff surgery x 2   ??? Right hand surgery   _____________  Family History ??? Diabetes Mother  ??? Hyperlipidemia Mother  ??? Hypertension Mother  _____________  Social History ??? Marital Status: Single   Spouse Name: N/A   Number of Children: N/A ??? Years of Education: N/A  Occupational History ??? Not on file.  Social History Main Topics ??? Smoking status: Never Smoker  ??? Smokeless tobacco: Never Used ??? Alcohol Use: No ??? Drug Use: No ??? Sexual Activity: Not on file  Social History Narrative  Lives @ [facility].  Disabled. _____________   Present Illness 44 y/o male with the above complex problem list.  He has no prior h/o CAD but multiple risk factors including DM, HTN, HL, obesity, and CKD.  He has a h/o chronic venous stasis ulcers and is s/p L BKA in 2013.  He was admitted to [facility] in March of this year with RLE cellulitis, AMS, and acute renal failure  requiring placement of a tunneled catheter and HD while hospitalized.  He was treated with abx and seen by vascular surgery, who did not feel that he required surgical intervention @ that time.  He was subsequently discharged w/ renal, vascular, and wound care f/u arranged.  He lives @ [employer] [facility] [facility] and says that he is relatively sedentary.  In August, he was admitted to [facility] with recurrent RLE cellulitis with blood cultures growing out GPC--> MRSA.  Echo showed no evidence of veg or mass.  He was subsequently discharged on 4 wks of vancomycin therapy via right IJ central line.  He says that he did have some improvement @ home initially but over the past few wks has been experiencing progressive malaise, subjective fevers, chills, anorexia, productive cough, dyspnea, and nausea.  He was seen by ID as an outpt on 9/15 and was found to be more anemic than previous with leukocytosis and acute renal failure.  He was referred to the ED yesterday. There, WBC was 21.9, Hgb 6.1, Creat 5.71, and trop 0.89->1.10->1.30.  He was admitted and blood cultures were drawn, returning with GPC in clusters.  He has been placed on daptomycin and we have been asked to eval for TEE given concern for endocarditis.  Pt currently denies chest pain or dyspnea.  He is lethargic and c/o generalized malaise and wkns.   Physical Exam:  GEN Pleasant, lethargic, NAD.   HEENT pink conjunctivae, dry oral mucosa   NECK supple  obese - difficult to assess JVP.   RESP normal resp effort  Diminished breath sounds bilat - poor effort.   CARD Regular rate and rhythm  Normal, S1, S2  No murmur   ABD denies tenderness  soft  normal BS  obese.   EXTR L BKA.  RLE is edematous to knee with scaling and multiple ulcerations.   SKIN ulcerations and chronic changes to rle   NEURO cranial nerves intact, motor/sensory function intact   PSYCH alert, A+O to time, place, person, poor insight   Review of Systems:  General: Fatigue   Fever/chills  Weakness  anorexia.   Skin: RLE scaling/swelling/ulcerations.   ENT: No Complaints   Eyes: No Complaints   Neck: No Complaints   Respiratory: Short of breath   Cardiovascular: No Complaints   Gastrointestinal: Nausea   Genitourinary: No Complaints   Vascular: No Complaints   Musculoskeletal: Muscle or joint pain   Neurologic: No Complaints   Hematologic: No Complaints   Endocrine: No Complaints   Psychiatric: Depression   Review of Systems: All other systems were reviewed and found to be negative   Medications/Allergies Reviewed Medications/Allergies reviewed   Home Medications: Medication Instructions Status  Bactrim DS 800 mg-160 mg oral tablet 1 tab(s) orally 2 times a day for 10 days Active  Risa-Bid - oral tablet 1 tab(s) orally 2 times a day Active  Senna Plus 50 mg-8.6 mg oral tablet 1 tab(s) orally 2 times a day Active  sertraline 100 mg oral tablet 1.5 tab(s) orally once a day Active  pentoxifylline 400 mg oral tablet, extended release 1 tab(s) orally 3 times a day Active  Vitamin D3 5000 intl units oral tablet 1 tab(s) orally once a day except on Saturday and Sunday Active  OxyCONTIN 10 mg oral tablet, extended release 1 tab(s) orally 2 times a day Active  mirtazapine 30 mg oral tablet 1 tab(s) orally once a day (at bedtime) Active  Pepcid 20 mg oral tablet 1 tab(s) orally 2 times a day Active  cloNIDine 0.2 mg oral tablet 1 tab(s) orally 2 times a day Active  Nephro-Vite Vitamin B Complex with C and Folic Acid oral tablet 1 tab(s) orally once a day Active  Eliquis 5 mg oral tablet 1 tab(s) orally 2 times a day Active  Coreg 25 mg oral tablet 1 tab(s) orally 2 times a day Active  acetaminophen 325 mg oral tablet 2 tab(s) orally every 4 hours, As Needed - for Fever or pain Active  ferrous sulfate 325 mg oral tablet 1 tab(s) orally once a day Active  Abilify 5 mg oral tablet 1 tab(s) orally once a day Active  allopurinol 100 mg oral tablet 1  tab(s) orally once a day Active  amLODIPine 10 mg oral tablet 1 tab(s) orally once a day Active  Lac-Hydrin 12% topical cream Apply topically to affected area on right lower extremity 3 times a day Active  Levemir FlexTouch 100 units/mL subcutaneous solution 15 unit(s) subcutaneous once a day (at bedtime) Active  Phenergan 25 mg/mL injectable solution 1 milliliter(s) intramuscular every 6 hours, As Needed - for Nausea, Vomiting Active  Santyl 250 units/g topical ointment Apply topically to affected area on right lateral calf and right dorsal foot once a day Active  Zofran 8 mg oral tablet 1 tab(s) orally every  6 hours, As Needed for nausea Active   Lab Results:  Routine Micro:  16-Sep-15 12:37   Micro Text Report BLOOD CULTURE   COMMENT                   IN AEROBIC AND ANAEROBIC BOTTLES   GRAM STAIN                GRAM POSITIVE COCCI IN CLUSTERS   ANTIBIOTIC                       Micro Text Report BLOOD CULTURE   COMMENT                   IN AEROBIC AND ANAEROBIC BOTTLES   GRAM STAIN                GRAM POSITIVE COCCI IN CLUSTERS   ANTIBIOTIC                       Culture Comment IN AEROBIC AND ANAEROBIC BOTTLES  Culture Comment IN AEROBIC AND ANAEROBIC BOTTLES  Gram Stain 1 GRAM POSITIVE COCCI IN CLUSTERS  Gram Stain 1 GRAM POSITIVE COCCI IN CLUSTERS  17-Sep-15 04:41   Micro Text Report CLOSTRIDIUM DIFFICILE   C.DIFFICILE ANTIGEN       C.DIFFICILE GDH ANTIGEN : NEGATIVE   C.DIFFICILE TOXIN A/B     C.DIFFICILE TOXINS A AND B : NEGATIVE   INTERPRETATION            Negative for C. difficile.    ANTIBIOTIC                        Routine Chem:  17-Sep-15 05:08   Glucose, Serum  149  BUN  96  Creatinine (comp)  5.82  Sodium, Serum 140  Potassium, Serum 4.3  Chloride, Serum  113  CO2, Serum  15  Calcium (Total), Serum  8.1  Anion Gap 12  Osmolality (calc) 312  eGFR (African American)  12  eGFR (Non-African American)  11 (eGFR values <9m/min/1.73 m2 may be an indication of  chronic kidney disease (CKD). Calculated eGFR is useful in patients with stable renal function. The eGFR calculation will not be reliable in acutely ill patients when serum creatinine is changing rapidly. It is not useful in  patients on dialysis. The eGFR calculation may not be applicable to patients at the low and high extremes of body sizes, pregnant women, and vegetarians.)  Cholesterol, Serum 67  Triglycerides, Serum 170  HDL (INHOUSE)  5  VLDL Cholesterol Calculated 34  LDL Cholesterol Calculated 28 (Result(s) reported on 05 Aug 2014 at 05:43AM.)  Cardiac:  16-Sep-15 12:37   Troponin I  0.89 (0.00-0.05 0.05 ng/mL or less: NEGATIVE  Repeat testing in 3-6 hrs  if clinically indicated. >0.05 ng/mL: POTENTIAL  MYOCARDIAL INJURY. Repeat  testing in 3-6 hrs if  clinically indicated. NOTE: An increase or decrease  of 30% or more on serial  testing suggests a  clinically important change)    17:44   Troponin I  1.10 (0.00-0.05 0.05 ng/mL or less: NEGATIVE  Repeat testing in 3-6 hrs  if clinically indicated. >0.05 ng/mL: POTENTIAL  MYOCARDIAL INJURY. Repeat  testing in 3-6 hrs if  clinically indicated. NOTE: An increase or decrease  of 30% or more on serial  testing suggests a  clinically important change)    19:42   Troponin I  1.30 (  0.00-0.05 0.05 ng/mL or less: NEGATIVE  Repeat testing in 3-6 hrs  if clinically indicated. >0.05 ng/mL: POTENTIAL  MYOCARDIAL INJURY. Repeat  testing in 3-6 hrs if  clinically indicated. NOTE: An increase or decrease  of 30% or more on serial  testing suggests a  clinically important change)  Routine UA:  17-Sep-15 04:40   Color (UA) Amber  Clarity (UA) Cloudy  Glucose (UA) Negative  Bilirubin (UA) Negative  Ketones (UA) Negative  Specific Gravity (UA) 1.017  Blood (UA) 1+  pH (UA) 5.0  Protein (UA) 30 mg/dL  Nitrite (UA) Negative  Leukocyte Esterase (UA) Trace (Result(s) reported on 05 Aug 2014 at [facility].)  RBC (UA) 22  /HPF  WBC (UA) 22 /HPF  Bacteria (UA) 1+  Epithelial Cells (UA) <1 /HPF  Transitional Epithelial (UA) <1 /HPF  Mucous (UA) PRESENT (Result(s) reported on 05 Aug 2014 at [facility].)  Routine Sero:  17-Sep-15 04:41   Occult Blood, Feces NEGATIVE (Result(s) reported on 05 Aug 2014 at [facility]  Routine Hem:  17-Sep-15 05:08   WBC (CBC)  21.0  RBC (CBC)  2.29  Hemoglobin (CBC)  6.1  Hematocrit (CBC)  19.4  Platelet Count (CBC)  109  MCV 84  MCH 26.5  MCHC  31.4  RDW  15.6  Neutrophil % 88.5  Lymphocyte % 5.0  Monocyte % 6.1  Eosinophil % 0.2  Basophil % 0.2  Neutrophil #  18.6  Lymphocyte # 1.1  Monocyte #  1.3  Eosinophil # 0.0  Basophil # 0.0 (Result(s) reported on 05 Aug 2014 at 05:39AM.)   EKG:  EKG Interp. by me   Interpretation rsr, 97, no acute st/t changes.   Radiology Results: XRay:    16-Sep-15 12:49, Chest Portable Single View  Chest Portable Single View   REASON FOR EXAM:    hypoxia, high wbc  COMMENTS:       PROCEDURE: DXR - DXR PORTABLE CHEST SINGLE VIEW  - Aug 04 2014 12:49PM     CLINICAL DATA:  Hypoxia and leukocytosis    EXAM:  PORTABLE CHEST - 1 VIEW    COMPARISON:  June 30, 2014    FINDINGS:  Central catheter tip is in the superior vena cava near the  cavoatrial junction. No pneumothorax. There is no edema or  consolidation. Heart is enlarged with pulmonary venous hypertension.  No adenopathy. No bone lesions.     IMPRESSION:  Evidence ofvolume overload. No frank edema or consolidation.  Central catheter as described without pneumothorax.      Electronically Signed    By: Lowella Grip M.D.    On: 08/04/2014 13:34         Verified By: Leafy Kindle. WOODRUFF, M.D.,    Omnipaque 140: Dysuria  Contrast dye: Other  Vital Signs/Nurse's Notes: **Vital Signs.:   17-Sep-15 08:20  Vital Signs Type Pre Medication  Temperature Temperature (F) 97.7  Celsius 36.5  Pulse Pulse 91  Respirations Respirations 18  Systolic BP Systolic  BP 102  Diastolic BP (mmHg) Diastolic BP (mmHg) 63  Mean BP 77  Pulse Ox % Pulse Ox % 95  Pulse Ox Activity Level  At rest  Oxygen Delivery 3L    Impression 1.  GPC Bacteremia/Recurrent RLE cellulitis:  Pt presents with recurrent GPC bacteremia with h/o MRSA bacteremia and chronic RLE cellulitis.  Recently finished 4 wks of Vancomycin therapy following admission with bacteremia in August.  Chest wall echo at that time did not show obvious evidence of vegetation or mass,  though TEE was not performed.  In setting of recurrent bacteremia, we have been asked to perform TEE.  At this time, patient said that he would like to think it over.  Abx/daptomycin per IM.  We will touch base with pt again this afternoon and will plan to schedule TEE for tomorrow if he is willing to proceed.  Of note, he was seen by vascular surgery earlier this year @ [facility] and they were pleased with his wound healing @ that time.  May need to revisit vascular surgical options.  2.  Elevated troponin:  No h/o chest pain.  With acute renal failure, suspect troponin elevation is non-specific.  He did have mild troponin elevation in August when he was here.  F/U LV fxn on TEE (assuming he'll agree to proceed).  He does have multiple risk factors for CAD and provided that he has full recovery from current issues, we could consider stress testing for risk stratification at some point, realizing that with renal failure, he would not be a candidate for angiography.  Cont BB as BP allows.  With h/o DM, should be on statin.  3.  DVT:  On chronic eliquis.  In setting of relative youth and wt, he was on 19m bid @ home.  Switched to 2.525mbid on admission in setting of acute renal failure, though with GFR < 15, use of any NOAC is not supported.  Will have to follow his creat closely and consider appropriate anticoagulation strategy going forward.  I suspect that given his social situation, he is a poor coumadin candidate.  He does have an IVC  filter per [facility] notes.  4.  HTN:  Stable.  Pressures soft.  Follow closely.  5. DM: Per IM.  6.  Acute on chronic stage III-V kidney dzs:  Follow.  He did require brief dialysis @ [facility] in March in setting of uremic Ss and acidosis.  BUN 96, Creat 5.82, CO2 15 today.  Rec renal consult.   Electronic Signatures for Addendum Section:  ArKathlyn SacramentoMD) (Signed Addendum 17-Sep-15 16:42)  The patient was seen and examined. Agree with the above. Recurrent MSSA bactermia. No murmurs by exam. Agree with TEE. He feels very weak and prefers to wait which is reasonable considering anemia and acute renal failure which might make moderate sedation risky.   Electronic Signatures: ArKathlyn SacramentoMD)  (Signed 17-Sep-15 16:42)  Co-Signer: General Aspect/Present Illness, Home Medications, Allergies BeRogelia MireNP)  (Signed 17-Sep-15 10:52)  Authored: General Aspect/Present Illness, History and Physical Exam, Review of System, Home Medications, Labs, EKG , Radiology, Allergies, Vital Signs/Nurse's Notes, Impression/Plan   Last Updated: 17-Sep-15 16:42 by ArKathlyn SacramentoMD)

## 2015-03-12 NOTE — Consult Note (Signed)
Brief Consult Note: Diagnosis: Bacteremia - likely recurrent staph, RLE wound.   Patient was seen by consultant.   Consult note dictated.   Recommend further assessment or treatment.   Orders entered.   Discussed with Attending MD.   Comments: Continue daptomycin for likely S aureus bacteremia Remove picc line Repeat bcx once line out Would get TEE since suspicion was already high at last admission and has recurrent bacteremia.  Electronic Signatures: Dierdre HarnessFitzgerald, Jamey Harman Patrick (MD)  (Signed 17-Sep-15 08:49)  Authored: Brief Consult Note   Last Updated: 17-Sep-15 08:49 by Dierdre HarnessFitzgerald, Laketia Vicknair Patrick (MD)

## 2015-03-12 NOTE — Consult Note (Signed)
Chief Complaint:  Subjective/Chief Complaint awake and alert.  c/o back pain but only when he coughs. He reports emesis yesterday, non-bloody, note not recorded. mild epigastric discomfort. no bm obe 36 hours. several brown stools previously, heme negative.   VITAL SIGNS/ANCILLARY NOTES: **Vital Signs.:   19-Sep-15 05:30  Vital Signs Type Routine  Temperature Temperature (F) 98.8  Celsius 37.1  Temperature Source oral  Pulse Pulse 94  Respirations Respirations 21  Systolic BP Systolic BP 622  Diastolic BP (mmHg) Diastolic BP (mmHg) 60  Mean BP 78  Pulse Ox % Pulse Ox % 91  Pulse Ox Activity Level  At rest  Oxygen Delivery Room Air/ 21 %   Brief Assessment:  Cardiac Regular   Respiratory wheezing   Gastrointestinal details normal obese non-tender, bs positive, unable to palpate internal organs.  no cullens sign.   Lab Results: Routine Micro:  18-Sep-15 15:49   Culture Comment NO GROWTH IN 8-12 HOURS  Result(s) reported on 07 Aug 2014 at 12:00AM.    20:12   Culture Comment NO GROWTH IN 8-12 HOURS  Result(s) reported on 07 Aug 2014 at 04:00AM.  General Ref:  18-Sep-15 06:15   Haptoglobin ========== TEST NAME ==========  ========= RESULTS =========  = REFERENCE RANGE =  HAPTOGLOBIN  Haptoglobin Haptoglobin                     [   128 mg/dL            ]            [address]               [facility]            No: [phone]           [address] [place], [place] [address]           Lindon Romp, MD         [phone]   Result(s) reported on 07 Aug 2014 at 07:18AM.  Routine Chem:  18-Sep-15 20:12   LDH, Serum  279 (Result(s) reported on 06 Aug 2014 at 08:49PM.)  19-Sep-15 04:52   Glucose, Serum  188  BUN  123  Creatinine (comp)  6.34  Sodium, Serum 139  Potassium, Serum 4.8  Chloride, Serum  111  CO2, Serum  15  Calcium (Total), Serum  8.3  Anion Gap 13  Osmolality (calc) 322  eGFR (African American)  11  eGFR (Non-African American)  10  (eGFR values <85m/min/1.73 m2 may be an indication of chronic kidney disease (CKD). Calculated eGFR is useful in patients with stable renal function. The eGFR calculation will not be reliable in acutely ill patients when serum creatinine is changing rapidly. It is not useful in  patients on dialysis. The eGFR calculation may not be applicable to patients at the low and high extremes of body sizes, pregnant women, and vegetarians.)  Routine Sero:  17-Sep-15 04:41   Occult Blood, Feces NEGATIVE (Result(s) reported on 05 Aug 2014 at [facility])  Routine Hem:  16-Sep-15 12:37   Hemoglobin (CBC)  6.1  17-Sep-15 05:08   Hemoglobin (CBC)  6.1    20:18   Hemoglobin (CBC)  6.9  18-Sep-15 06:15   Hemoglobin (CBC)  6.7    20:12   Hemoglobin (CBC)  7.5 (Result(s) reported on 06 Aug 2014 at 08:38PM.)  19-Sep-15 04:52   WBC (CBC)  23.8  RBC (CBC)  2.60  Hemoglobin (CBC)  7.1  Hematocrit (CBC)  21.9  Platelet Count (CBC)  131 (Result(s) reported on 07 Aug 2014 at 05:59AM.)  MCV 84  MCH 27.5  MCHC 32.7  RDW  15.7  Segmented Neutrophils 86  Lymphocytes 6  Monocytes 3  Eosinophil 2  Basophil 1  Metamyelocyte 1  Myelocyte 1  NRBC 1  Diff Comment 1 ANISOCYTOSIS  Diff Comment 2 POIKILOCYTOSIS  Diff Comment 3 PLTS VARIED IN SIZE  Diff Comment 4 LARGE PLATELETS  Diff Comment 5 BURR CELLS  Result(s) reported on 07 Aug 2014 at 05:59AM.   Assessment/Plan:  Assessment/Plan:  Assessment 1) anemia, reported heme postive.  Heme negative on recheck.  no obvious evidence of GI bleeding to account for drop since last admission.  He has been transfused  several units with poor response. Haptoglobin normal, LDH minimally elevated.  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   Plan 1) high risk for sedated procedures.  Recommend ct abd/pelvis to rule out retroperitoneal bleeding if showing poor response to tfx.  recommend retic count and hematology consult.  Pleas  see full consult from yesterday.   Electronic Signatures: Loistine Simas (MD)  (Signed 19-Sep-15 10:14)  Authored: Chief Complaint, VITAL SIGNS/ANCILLARY NOTES, Brief Assessment, Lab Results, Assessment/Plan   Last Updated: 19-Sep-15 10:14 by Loistine Simas (MD)
